# Patient Record
Sex: Male | Born: 1935 | Race: White | Hispanic: No | Marital: Married | Smoking: Former smoker
Health system: Southern US, Community
[De-identification: ages and names within clinical notes are randomized; demographics above are authoritative.]

## PROBLEM LIST (undated history)

## (undated) DIAGNOSIS — K219 Gastro-esophageal reflux disease without esophagitis: Secondary | ICD-10-CM

## (undated) DIAGNOSIS — N401 Enlarged prostate with lower urinary tract symptoms: Secondary | ICD-10-CM

## (undated) DIAGNOSIS — N182 Chronic kidney disease, stage 2 (mild): Secondary | ICD-10-CM

## (undated) DIAGNOSIS — I442 Atrioventricular block, complete: Secondary | ICD-10-CM

## (undated) DIAGNOSIS — F419 Anxiety disorder, unspecified: Secondary | ICD-10-CM

## (undated) DIAGNOSIS — F3289 Other specified depressive episodes: Secondary | ICD-10-CM

## (undated) DIAGNOSIS — L299 Pruritus, unspecified: Secondary | ICD-10-CM

## (undated) DIAGNOSIS — M75120 Complete rotator cuff tear or rupture of unspecified shoulder, not specified as traumatic: Secondary | ICD-10-CM

## (undated) DIAGNOSIS — L570 Actinic keratosis: Secondary | ICD-10-CM

## (undated) DIAGNOSIS — I739 Peripheral vascular disease, unspecified: Secondary | ICD-10-CM

## (undated) DIAGNOSIS — F329 Major depressive disorder, single episode, unspecified: Secondary | ICD-10-CM

## (undated) DIAGNOSIS — K5730 Diverticulosis of large intestine without perforation or abscess without bleeding: Secondary | ICD-10-CM

## (undated) DIAGNOSIS — N508 Other specified disorders of male genital organs: Secondary | ICD-10-CM

## (undated) DIAGNOSIS — M1990 Unspecified osteoarthritis, unspecified site: Secondary | ICD-10-CM

## (undated) DIAGNOSIS — K5790 Diverticulosis of intestine, part unspecified, without perforation or abscess without bleeding: Secondary | ICD-10-CM

## (undated) DIAGNOSIS — G4700 Insomnia, unspecified: Secondary | ICD-10-CM

## (undated) DIAGNOSIS — I10 Essential (primary) hypertension: Secondary | ICD-10-CM

## (undated) DIAGNOSIS — N400 Enlarged prostate without lower urinary tract symptoms: Secondary | ICD-10-CM

## (undated) DIAGNOSIS — K222 Esophageal obstruction: Secondary | ICD-10-CM

## (undated) DIAGNOSIS — I2510 Atherosclerotic heart disease of native coronary artery without angina pectoris: Secondary | ICD-10-CM

## (undated) DIAGNOSIS — H9190 Unspecified hearing loss, unspecified ear: Secondary | ICD-10-CM

## (undated) DIAGNOSIS — I82409 Acute embolism and thrombosis of unspecified deep veins of unspecified lower extremity: Secondary | ICD-10-CM

## (undated) DIAGNOSIS — M720 Palmar fascial fibromatosis [Dupuytren]: Secondary | ICD-10-CM

## (undated) DIAGNOSIS — D126 Benign neoplasm of colon, unspecified: Secondary | ICD-10-CM

## (undated) DIAGNOSIS — E119 Type 2 diabetes mellitus without complications: Secondary | ICD-10-CM

## (undated) DIAGNOSIS — R1310 Dysphagia, unspecified: Secondary | ICD-10-CM

## (undated) DIAGNOSIS — M545 Low back pain, unspecified: Secondary | ICD-10-CM

## (undated) DIAGNOSIS — N138 Other obstructive and reflux uropathy: Secondary | ICD-10-CM

## (undated) DIAGNOSIS — K449 Diaphragmatic hernia without obstruction or gangrene: Secondary | ICD-10-CM

## (undated) DIAGNOSIS — C439 Malignant melanoma of skin, unspecified: Secondary | ICD-10-CM

## (undated) DIAGNOSIS — E785 Hyperlipidemia, unspecified: Secondary | ICD-10-CM

## (undated) DIAGNOSIS — K5900 Constipation, unspecified: Secondary | ICD-10-CM

## (undated) HISTORY — PX: COLONOSCOPY: SHX174

## (undated) HISTORY — DX: Acute embolism and thrombosis of unspecified deep veins of unspecified lower extremity: I82.409

## (undated) HISTORY — DX: Low back pain, unspecified: M54.50

## (undated) HISTORY — DX: Benign prostatic hyperplasia with lower urinary tract symptoms: N13.8

## (undated) HISTORY — DX: Pruritus, unspecified: L29.9

## (undated) HISTORY — DX: Other specified disorders of male genital organs: N50.8

## (undated) HISTORY — DX: Peripheral vascular disease, unspecified: I73.9

## (undated) HISTORY — DX: Actinic keratosis: L57.0

## (undated) HISTORY — DX: Malignant melanoma of skin, unspecified: C43.9

## (undated) HISTORY — DX: Low back pain: M54.5

## (undated) HISTORY — DX: Dysphagia, unspecified: R13.10

## (undated) HISTORY — DX: Benign prostatic hyperplasia without lower urinary tract symptoms: N40.0

## (undated) HISTORY — DX: Major depressive disorder, single episode, unspecified: F32.9

## (undated) HISTORY — DX: Diverticulosis of intestine, part unspecified, without perforation or abscess without bleeding: K57.90

## (undated) HISTORY — DX: Constipation, unspecified: K59.00

## (undated) HISTORY — DX: Atherosclerotic heart disease of native coronary artery without angina pectoris: I25.10

## (undated) HISTORY — DX: Complete rotator cuff tear or rupture of unspecified shoulder, not specified as traumatic: M75.120

## (undated) HISTORY — DX: Benign prostatic hyperplasia with lower urinary tract symptoms: N40.1

## (undated) HISTORY — DX: Insomnia, unspecified: G47.00

## (undated) HISTORY — DX: Other specified depressive episodes: F32.89

## (undated) HISTORY — DX: Unspecified hearing loss, unspecified ear: H91.90

## (undated) HISTORY — PX: PACEMAKER INSERTION: SHX728

## (undated) HISTORY — DX: Esophageal obstruction: K22.2

## (undated) HISTORY — DX: Diverticulosis of large intestine without perforation or abscess without bleeding: K57.30

## (undated) HISTORY — DX: Essential (primary) hypertension: I10

## (undated) HISTORY — DX: Unspecified osteoarthritis, unspecified site: M19.90

## (undated) HISTORY — DX: Palmar fascial fibromatosis (dupuytren): M72.0

## (undated) HISTORY — DX: Benign neoplasm of colon, unspecified: D12.6

## (undated) HISTORY — DX: Type 2 diabetes mellitus without complications: E11.9

## (undated) HISTORY — PX: CATARACT EXTRACTION: SUR2

## (undated) HISTORY — DX: Anxiety disorder, unspecified: F41.9

## (undated) HISTORY — DX: Hyperlipidemia, unspecified: E78.5

## (undated) HISTORY — DX: Gastro-esophageal reflux disease without esophagitis: K21.9

## (undated) HISTORY — DX: Chronic kidney disease, stage 2 (mild): N18.2

## (undated) HISTORY — DX: Atrioventricular block, complete: I44.2

## (undated) HISTORY — DX: Diaphragmatic hernia without obstruction or gangrene: K44.9

---
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
## 1978-05-22 HISTORY — PX: OTHER SURGICAL HISTORY: SHX169

## 1985-05-22 HISTORY — PX: CAROTID ENDARTERECTOMY: SUR193

## 1991-05-23 HISTORY — PX: KNEE ARTHROSCOPY: SUR90

## 1997-11-16 ENCOUNTER — Ambulatory Visit (HOSPITAL_COMMUNITY): Admission: RE | Admit: 1997-11-16 | Discharge: 1997-11-16 | Payer: Self-pay | Admitting: Specialist

## 1997-12-28 ENCOUNTER — Other Ambulatory Visit: Admission: RE | Admit: 1997-12-28 | Discharge: 1997-12-28 | Payer: Self-pay | Admitting: Gastroenterology

## 2001-03-14 ENCOUNTER — Ambulatory Visit (HOSPITAL_COMMUNITY): Admission: RE | Admit: 2001-03-14 | Discharge: 2001-03-14 | Payer: Self-pay | Admitting: Cardiology

## 2002-10-23 ENCOUNTER — Emergency Department (HOSPITAL_COMMUNITY): Admission: EM | Admit: 2002-10-23 | Discharge: 2002-10-23 | Payer: Self-pay | Admitting: Emergency Medicine

## 2002-10-23 ENCOUNTER — Encounter: Payer: Self-pay | Admitting: Emergency Medicine

## 2002-11-19 ENCOUNTER — Encounter: Payer: Self-pay | Admitting: Internal Medicine

## 2002-11-19 ENCOUNTER — Ambulatory Visit (HOSPITAL_COMMUNITY): Admission: RE | Admit: 2002-11-19 | Discharge: 2002-11-19 | Payer: Self-pay | Admitting: Internal Medicine

## 2002-11-20 ENCOUNTER — Ambulatory Visit (HOSPITAL_COMMUNITY): Admission: RE | Admit: 2002-11-20 | Discharge: 2002-11-20 | Payer: Self-pay | Admitting: Internal Medicine

## 2002-11-20 ENCOUNTER — Encounter: Payer: Self-pay | Admitting: Internal Medicine

## 2003-04-29 ENCOUNTER — Inpatient Hospital Stay (HOSPITAL_COMMUNITY): Admission: EM | Admit: 2003-04-29 | Discharge: 2003-04-30 | Payer: Self-pay | Admitting: Emergency Medicine

## 2003-05-23 HISTORY — PX: COLONOSCOPY: SHX174

## 2003-11-08 ENCOUNTER — Ambulatory Visit (HOSPITAL_COMMUNITY): Admission: RE | Admit: 2003-11-08 | Discharge: 2003-11-08 | Payer: Self-pay | Admitting: Orthopaedic Surgery

## 2003-12-10 ENCOUNTER — Ambulatory Visit (HOSPITAL_COMMUNITY): Admission: RE | Admit: 2003-12-10 | Discharge: 2003-12-10 | Payer: Self-pay | Admitting: Neurology

## 2004-01-14 ENCOUNTER — Ambulatory Visit (HOSPITAL_COMMUNITY): Admission: RE | Admit: 2004-01-14 | Discharge: 2004-01-15 | Payer: Self-pay | Admitting: Internal Medicine

## 2004-01-21 ENCOUNTER — Ambulatory Visit (HOSPITAL_COMMUNITY): Admission: RE | Admit: 2004-01-21 | Discharge: 2004-01-21 | Payer: Self-pay | Admitting: Internal Medicine

## 2004-03-29 ENCOUNTER — Ambulatory Visit: Payer: Self-pay | Admitting: Cardiology

## 2004-04-27 ENCOUNTER — Ambulatory Visit: Payer: Self-pay | Admitting: Cardiology

## 2004-05-24 ENCOUNTER — Ambulatory Visit: Payer: Self-pay | Admitting: *Deleted

## 2004-06-09 ENCOUNTER — Ambulatory Visit: Payer: Self-pay | Admitting: Internal Medicine

## 2004-11-08 ENCOUNTER — Ambulatory Visit: Payer: Self-pay | Admitting: Cardiology

## 2005-06-19 ENCOUNTER — Ambulatory Visit: Payer: Self-pay | Admitting: Internal Medicine

## 2005-07-05 ENCOUNTER — Ambulatory Visit: Payer: Self-pay

## 2005-11-21 ENCOUNTER — Ambulatory Visit: Payer: Self-pay | Admitting: Cardiology

## 2005-11-23 ENCOUNTER — Ambulatory Visit: Payer: Self-pay | Admitting: Internal Medicine

## 2005-12-25 ENCOUNTER — Ambulatory Visit: Payer: Self-pay | Admitting: Internal Medicine

## 2006-02-01 ENCOUNTER — Ambulatory Visit: Payer: Self-pay | Admitting: *Deleted

## 2006-02-01 ENCOUNTER — Inpatient Hospital Stay (HOSPITAL_COMMUNITY): Admission: EM | Admit: 2006-02-01 | Discharge: 2006-02-04 | Payer: Self-pay | Admitting: Emergency Medicine

## 2006-02-06 ENCOUNTER — Encounter: Payer: Self-pay | Admitting: Cardiology

## 2006-02-06 ENCOUNTER — Ambulatory Visit: Payer: Self-pay

## 2006-02-15 ENCOUNTER — Ambulatory Visit: Payer: Self-pay | Admitting: Cardiology

## 2006-04-09 ENCOUNTER — Ambulatory Visit: Payer: Self-pay | Admitting: Cardiology

## 2006-05-28 ENCOUNTER — Ambulatory Visit (HOSPITAL_COMMUNITY): Admission: RE | Admit: 2006-05-28 | Discharge: 2006-05-28 | Payer: Self-pay | Admitting: Cardiology

## 2006-05-28 ENCOUNTER — Ambulatory Visit: Payer: Self-pay | Admitting: Cardiology

## 2006-05-29 ENCOUNTER — Ambulatory Visit: Payer: Self-pay | Admitting: Cardiology

## 2006-05-29 ENCOUNTER — Ambulatory Visit: Payer: Self-pay

## 2006-05-29 ENCOUNTER — Encounter: Payer: Self-pay | Admitting: Cardiology

## 2006-12-24 ENCOUNTER — Ambulatory Visit: Payer: Self-pay

## 2007-02-11 ENCOUNTER — Ambulatory Visit: Payer: Self-pay | Admitting: Internal Medicine

## 2007-02-25 ENCOUNTER — Encounter: Payer: Self-pay | Admitting: *Deleted

## 2007-02-25 DIAGNOSIS — I10 Essential (primary) hypertension: Secondary | ICD-10-CM

## 2007-02-25 DIAGNOSIS — I428 Other cardiomyopathies: Secondary | ICD-10-CM | POA: Insufficient documentation

## 2007-02-25 DIAGNOSIS — M75120 Complete rotator cuff tear or rupture of unspecified shoulder, not specified as traumatic: Secondary | ICD-10-CM

## 2007-02-25 DIAGNOSIS — Z9089 Acquired absence of other organs: Secondary | ICD-10-CM

## 2007-02-25 DIAGNOSIS — M1990 Unspecified osteoarthritis, unspecified site: Secondary | ICD-10-CM | POA: Insufficient documentation

## 2007-02-25 DIAGNOSIS — D126 Benign neoplasm of colon, unspecified: Secondary | ICD-10-CM | POA: Insufficient documentation

## 2007-02-25 DIAGNOSIS — Z87898 Personal history of other specified conditions: Secondary | ICD-10-CM | POA: Insufficient documentation

## 2007-02-25 DIAGNOSIS — Z8719 Personal history of other diseases of the digestive system: Secondary | ICD-10-CM

## 2007-02-25 DIAGNOSIS — Z9889 Other specified postprocedural states: Secondary | ICD-10-CM

## 2007-02-25 DIAGNOSIS — K219 Gastro-esophageal reflux disease without esophagitis: Secondary | ICD-10-CM | POA: Insufficient documentation

## 2007-02-25 DIAGNOSIS — F329 Major depressive disorder, single episode, unspecified: Secondary | ICD-10-CM

## 2007-02-25 DIAGNOSIS — I459 Conduction disorder, unspecified: Secondary | ICD-10-CM

## 2007-02-25 DIAGNOSIS — E119 Type 2 diabetes mellitus without complications: Secondary | ICD-10-CM

## 2007-02-25 DIAGNOSIS — I739 Peripheral vascular disease, unspecified: Secondary | ICD-10-CM

## 2007-02-25 DIAGNOSIS — I82409 Acute embolism and thrombosis of unspecified deep veins of unspecified lower extremity: Secondary | ICD-10-CM | POA: Insufficient documentation

## 2007-02-25 DIAGNOSIS — E785 Hyperlipidemia, unspecified: Secondary | ICD-10-CM | POA: Insufficient documentation

## 2007-02-25 DIAGNOSIS — K449 Diaphragmatic hernia without obstruction or gangrene: Secondary | ICD-10-CM | POA: Insufficient documentation

## 2007-03-12 ENCOUNTER — Ambulatory Visit: Payer: Self-pay | Admitting: Internal Medicine

## 2007-04-10 ENCOUNTER — Ambulatory Visit: Payer: Self-pay | Admitting: Internal Medicine

## 2007-05-07 ENCOUNTER — Ambulatory Visit: Payer: Self-pay | Admitting: Internal Medicine

## 2007-06-05 ENCOUNTER — Ambulatory Visit: Payer: Self-pay | Admitting: Internal Medicine

## 2007-09-04 ENCOUNTER — Ambulatory Visit: Payer: Self-pay | Admitting: Internal Medicine

## 2007-12-04 ENCOUNTER — Ambulatory Visit: Payer: Self-pay | Admitting: Internal Medicine

## 2007-12-23 ENCOUNTER — Ambulatory Visit: Payer: Self-pay | Admitting: Internal Medicine

## 2008-03-27 ENCOUNTER — Ambulatory Visit: Payer: Self-pay | Admitting: Internal Medicine

## 2008-06-19 ENCOUNTER — Encounter (INDEPENDENT_AMBULATORY_CARE_PROVIDER_SITE_OTHER): Payer: Self-pay | Admitting: *Deleted

## 2008-08-11 ENCOUNTER — Encounter: Payer: Self-pay | Admitting: Internal Medicine

## 2008-08-11 ENCOUNTER — Ambulatory Visit: Payer: Self-pay | Admitting: Internal Medicine

## 2008-08-26 ENCOUNTER — Ambulatory Visit: Payer: Self-pay | Admitting: Internal Medicine

## 2008-10-28 ENCOUNTER — Ambulatory Visit: Payer: Self-pay | Admitting: Internal Medicine

## 2009-01-05 ENCOUNTER — Encounter: Payer: Self-pay | Admitting: Internal Medicine

## 2009-01-15 ENCOUNTER — Encounter: Payer: Self-pay | Admitting: Internal Medicine

## 2009-01-20 DIAGNOSIS — C439 Malignant melanoma of skin, unspecified: Secondary | ICD-10-CM

## 2009-01-20 HISTORY — DX: Malignant melanoma of skin, unspecified: C43.9

## 2009-01-27 ENCOUNTER — Ambulatory Visit: Payer: Self-pay | Admitting: Internal Medicine

## 2009-02-26 HISTORY — PX: ENUCLEATION: SHX628

## 2009-04-28 ENCOUNTER — Ambulatory Visit: Payer: Self-pay | Admitting: Internal Medicine

## 2009-04-28 ENCOUNTER — Encounter: Payer: Self-pay | Admitting: Internal Medicine

## 2009-07-08 ENCOUNTER — Encounter: Admission: RE | Admit: 2009-07-08 | Discharge: 2009-07-08 | Payer: Self-pay | Admitting: Internal Medicine

## 2009-08-03 ENCOUNTER — Ambulatory Visit: Payer: Self-pay | Admitting: Internal Medicine

## 2009-10-05 ENCOUNTER — Ambulatory Visit: Payer: Self-pay | Admitting: Vascular Surgery

## 2009-11-02 ENCOUNTER — Ambulatory Visit: Payer: Self-pay | Admitting: Internal Medicine

## 2010-02-01 ENCOUNTER — Encounter: Payer: Self-pay | Admitting: Internal Medicine

## 2010-02-01 ENCOUNTER — Ambulatory Visit: Payer: Self-pay | Admitting: Internal Medicine

## 2010-03-18 ENCOUNTER — Encounter (INDEPENDENT_AMBULATORY_CARE_PROVIDER_SITE_OTHER): Payer: Self-pay | Admitting: *Deleted

## 2010-04-01 ENCOUNTER — Telehealth (INDEPENDENT_AMBULATORY_CARE_PROVIDER_SITE_OTHER): Payer: Self-pay | Admitting: *Deleted

## 2010-05-02 ENCOUNTER — Encounter (INDEPENDENT_AMBULATORY_CARE_PROVIDER_SITE_OTHER): Payer: Self-pay | Admitting: *Deleted

## 2010-05-03 ENCOUNTER — Ambulatory Visit: Payer: Self-pay | Admitting: Internal Medicine

## 2010-06-21 NOTE — Cardiovascular Report (Signed)
Summary: Office Visit   Office Visit   Imported By: Roderic Ovens 08/06/2009 16:21:03  _____________________________________________________________________  External Attachment:    Type:   Image     Comment:   External Document

## 2010-06-21 NOTE — Progress Notes (Signed)
Summary: Records Request   Faxed OV & EKG to Robin at [facility] ([phone]). Debby Freiberg  April 01, 2010 12:33 PM

## 2010-06-21 NOTE — Assessment & Plan Note (Signed)
Summary: pc2  Medications Added OMEPRAZOLE 40 MG CPDR (OMEPRAZOLE) Take one tablet by mouth once daily. SIMVASTATIN 40 MG TABS (SIMVASTATIN) Take one tablet by mouth daily at bedtime ASPIRIN 81 MG TBEC (ASPIRIN) Take one tablet by mouth daily SERTRALINE HCL 50 MG TABS (SERTRALINE HCL) Take one tablet by mouth once daily. VITAMIN D 1000 UNIT TABS (CHOLECALCIFEROL) Take one tablet by mouth once daily.      Allergies Added:   Visit Type:  Follow-up Primary Provider:  Ann Maki, MD   History of Present Illness: Eugene Long returns today for followup.  He is a very pleasant elderly male with symptomatic bradycardia and a pacemaker implanted back in 2005. The patient notes today that he has very minimal dyspnea with exertion. This is not particularly worse than it has been.  He admits to some sedentary lifestyle in the last several months secondary to the cold weather.  He returns today for followup.  He has had no palpitations. 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  Current Medications (verified): 1)  Glimepiride 2 Mg  Tabs (Glimepiride) .... Take One Half Tablet Daily 2)  Omeprazole 40 Mg Cpdr (Omeprazole) .... Take One Tablet By Mouth Once Daily. 3)  Finasteride 5 Mg  Tabs (Finasteride) .... Take One Tablet Once Daily 4)  Amlodipine Besylate 2.5 Mg  Tabs (Amlodipine Besylate) .... Take One Tablet Once Daily 5)  Metoprolol Tartrate 25 Mg  Tabs (Metoprolol Tartrate) .... Take One Tablet Twice Daily 6)  Stool Softener .... Take One Daily 7)  Lisinopril 10 Mg  Tabs (Lisinopril) .... Take One Tablet Once Daily 8)  Simvastatin 40 Mg Tabs (Simvastatin) .... Take One Tablet By Mouth Daily At Bedtime 9)  Aspirin 81 Mg Tbec (Aspirin) .... Take One Tablet By Mouth Daily 10)  Claritin 10 Mg  Tabs (Loratadine) .... Take One Tablet Once Daily 11)  Alprazolam 0.25 Mg  Tabs (Alprazolam) .... Take One Tablet Once Daily 12)  Nitroquick 0.4 Mg  Subl  (Nitroglycerin) .... Take One Tablet Sl As Needed For Chest Pain 13)  Sertraline Hcl 50 Mg Tabs (Sertraline Hcl) .... Take One Tablet By Mouth Once Daily. 14)  Vitamin D 1000 Unit Tabs (Cholecalciferol) .... Take One Tablet By Mouth Once Daily.  Allergies (verified): 1)  ! Codeine 2)  ! 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) DYSLIPIDEMIA (ICD-272.4) DIABETES MELLITUS, TYPE II (ICD-250.00) BENIGN PROSTATIC HYPERTROPHY, HX OF (ICD-V13.8) DIVERTICULOSIS, COLON, HX OF (ICD-V12.79) PACEMAKER, PERMANENT (ICD-V45.01) HYPERTENSION (ICD-401.9) ARTHROSCOPY, KNEE, HX OF (ICD-V45.89) APPENDECTOMY, HX OF (ICD-V45.79) CAROTID ENDARTERECTOMY, RIGHT, HX OF (ICD-V15.1)   HYPERTENSION (ICD-401.9)  Past Surgical History: Last updated: 02/25/2007 PACEMAKER, PERMANENT (ICD-V45.01) ARTHROSCOPY, KNEE, HX OF (ICD-V45.89) APPENDECTOMY, HX OF (ICD-V45.79) CAROTID ENDARTERECTOMY, RIGHT, HX OF (ICD-V15.1)    Review of Systems  The patient denies chest pain, syncope, dyspnea on exertion, and peripheral edema.    Vital Signs:  Patient profile:   77 year old male Height:      68 inches Weight:      150 pounds Pulse rate:   60 / minute BP sitting:   144 / 60  (left arm)  Vitals Entered By: Laurance Flatten CMA (August 03, 2009 9:39 AM)  Physical Exam  General:  Elderly, well developed, well nourished, in no acute distress.  HEENT: normal except for left eye prosthesis. Neck: supple. No JVD. Carotids 2+ bilaterally no bruits Cor: RRR no rubs, gallops or murmur Lungs: CTA. Well healed PPM incision. Ab: soft, nontender. nondistended. No HSM. Good bowel sounds Ext:  warm. no cyanosis, clubbing or edema Neuro: alert and oriented. Grossly nonfocal. affect pleasant    PPM Specifications Following MD:  Lewayne Bunting, MD     PPM Vendor:  Medtronic     PPM Model Number:  [id number] [id number]     PPM Serial Number:  [id number] PPM DOI:  01/14/2004      Lead 1    Location: RA     DOI: 01/14/2004     Model #: [id number]     Serial #: [id number]     Status: active Lead 2    Location: RV     DOI: 01/14/2004     Model #: [id number]     Serial #: [id number]     Status: active  Magnet Response Rate:  BOL 100 ERI 86  Indications:  Syncope   PPM Follow Up Remote Check?  No Battery Voltage:  2.75 V     Battery Est. Longevity:  4 YEARS     Pacer Dependent:  Yes       PPM Device Measurements Atrium  Amplitude: 2.5 mV, Impedance: 500 ohms, Threshold: 0.75 V at 0.4 msec Right Ventricle  Amplitude: PACED AT 30 mV, Impedance: 500 ohms, Threshold: 1.25 V at 0.4 msec  Episodes MS Episodes:  0     Ventricular High Rate:  0     Atrial Pacing:  63%     Ventricular Pacing:  100%  Parameters Mode:  DDD     Lower Rate Limit:  60     Upper Rate Limit:  140 Paced AV Delay:  275     Next Cardiology Appt Due:  01/20/2010 Tech Comments:  Normal device function.  Histagrams flat, but patient did not tolerate rate response being on in the past.  No changes made today.  Pt does TTM's with [facility].  ROV 6 months clinic. Gypsy Balsam RN BSN  August 03, 2009 9:42 AM  MD Comments:  Agree with above.  Impression & Recommendations:  Problem # 1:  PACEMAKER (ICD-V45.Marland Kitchen01) His device is working normally with about 3 yrs of longevity.  Problem # 2:  DYSLIPIDEMIA (ICD-272.4) He will continue his daily walking an maintain a low sodium diet. His updated medication list for this problem includes:    Simvastatin 40 Mg Tabs (Simvastatin) .Marland Kitchen... Take one tablet by mouth daily at bedtime

## 2010-06-21 NOTE — Cardiovascular Report (Signed)
Summary: TTM   TTM   Imported By: Roderic Ovens 02/16/2010 11:28:35  _____________________________________________________________________  External Attachment:    Type:   Image     Comment:   External Document

## 2010-06-21 NOTE — Miscellaneous (Signed)
Summary: dx code correction   Clinical Lists Changes  Problems: Changed problem from PACEMAKER (ICD-V45..01) to PACEMAKER, PERMANENT (ICD-V45.01) changed the incorrect dx code to correct dx code Donna Keene  March 18, 2010 10:31 AM 

## 2010-06-23 NOTE — Letter (Signed)
Summary: Appointment - Reschedule  [facility], Main Office  [address]   [place], [place] [address]   Phone: [phone]  Fax: [phone]     May 02, 2010 MRN: [id number]   [facility] [address] [place], [place]  [address]   Dear Mr. Deike,   Due to a change in our office schedule, your appointment on 08-02-10  at  12:20 must be changed.  It is very important that we reach you to reschedule this appointment. We look forward to participating in your health care needs. Please contact us at the number listed above at your earliest convenience to reschedule this appointment.     Sincerely,  Glass blower/designer

## 2010-06-23 NOTE — Cardiovascular Report (Signed)
Summary: TTM   TTM   Imported By: Roderic Ovens 05/20/2010 11:42:26  _____________________________________________________________________  External Attachment:    Type:   Image     Comment:   External Document

## 2010-06-23 NOTE — Letter (Signed)
Summary: Appointment - Reschedule  [facility], Main Office  [address]   [place], [place] [address]   Phone: [phone]  Fax: [phone]     May 02, 2010 MRN: [id number]   [facility] [address] [place], [place]  [address]   Dear Mr. Fraleigh,   Due to a change in our office schedule, your appointment on  08-02-10  at 12:10pm           must be changed.  It is very important that we reach you to reschedule this appointment. We look forward to participating in your health care needs. Please contact us at the number listed above at your earliest convenience to reschedule this appointment.     Sincerely,  Glass blower/designer

## 2010-06-24 NOTE — Cardiovascular Report (Signed)
Summary: TTM   TTM   Imported By: Roderic Ovens 11/29/2009 14:54:22  _____________________________________________________________________  External Attachment:    Type:   Image     Comment:   External Document

## 2010-08-02 ENCOUNTER — Encounter: Payer: Self-pay | Admitting: Internal Medicine

## 2010-08-02 ENCOUNTER — Encounter (INDEPENDENT_AMBULATORY_CARE_PROVIDER_SITE_OTHER): Payer: Medicare Other | Admitting: Internal Medicine

## 2010-08-02 DIAGNOSIS — I10 Essential (primary) hypertension: Secondary | ICD-10-CM

## 2010-08-02 DIAGNOSIS — I442 Atrioventricular block, complete: Secondary | ICD-10-CM

## 2010-08-09 NOTE — Cardiovascular Report (Signed)
Summary: Office Visit   Office Visit   Imported By: Roderic Ovens 08/05/2010 15:01:55  _____________________________________________________________________  External Attachment:    Type:   Image     Comment:   External Document

## 2010-08-09 NOTE — Assessment & Plan Note (Signed)
Summary: device/saf Eartha Inch pt call-mj rs pt from [facility]plist/mt/kl  Medications Added PRAVASTATIN SODIUM 40 MG TABS (PRAVASTATIN SODIUM) Take one tablet by mouth daily at bedtime        Visit Type:  Follow-up Primary Provider:  Ann Maki, MD   History of Present Illness: Eugene Long returns today for followup.  He is a very pleasant elderly male with symptomatic bradycardia and a pacemaker implanted back in 2005. The patient notes today that he has very minimal dyspnea with exertion. This is not particularly worse than it has been.  He admits to some sedentary lifestyle in the last several months secondary to the cold weather.  He returns today for followup.  He has had no palpitations. He has been without recurrence of his lymphoma.  Current Medications (verified): 1)  Glimepiride 2 Mg  Tabs (Glimepiride) .... Take One Half Tablet Daily 2)  Omeprazole 40 Mg Cpdr (Omeprazole) .... Take One Tablet By Mouth Once Daily. 3)  Finasteride 5 Mg  Tabs (Finasteride) .... Take One Tablet Once Daily 4)  Amlodipine Besylate 2.5 Mg  Tabs (Amlodipine Besylate) .... Take One Tablet Once Daily 5)  Metoprolol Tartrate 25 Mg  Tabs (Metoprolol Tartrate) .... Take One Tablet Twice Daily 6)  Stool Softener .... Take One Daily 7)  Pravastatin Sodium 40 Mg Tabs (Pravastatin Sodium) .... Take One Tablet By Mouth Daily At Bedtime 8)  Aspirin 81 Mg Tbec (Aspirin) .... Take One Tablet By Mouth Daily 9)  Claritin 10 Mg  Tabs (Loratadine) .... Take One Tablet Once Daily 10)  Alprazolam 0.25 Mg  Tabs (Alprazolam) .... Take One Tablet Once Daily 11)  Nitroquick 0.4 Mg  Subl (Nitroglycerin) .... Take One Tablet Sl As Needed For Chest Pain 12)  Sertraline Hcl 50 Mg Tabs (Sertraline Hcl) .... Take One Tablet By Mouth Once Daily. 13)  Vitamin D 1000 Unit Tabs (Cholecalciferol) .... Take One Tablet By Mouth Once Daily.  Allergies: 1)  ! Codeine 2)  ! 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) DYSLIPIDEMIA (ICD-272.4) DIABETES MELLITUS, TYPE II (ICD-250.00) BENIGN PROSTATIC HYPERTROPHY, HX OF (ICD-V13.8) DIVERTICULOSIS, COLON, HX OF (ICD-V12.79) PACEMAKER, PERMANENT (ICD-V45.01) HYPERTENSION (ICD-401.9) ARTHROSCOPY, KNEE, HX OF (ICD-V45.89) APPENDECTOMY, HX OF (ICD-V45.79) CAROTID ENDARTERECTOMY, RIGHT, HX OF (ICD-V15.1)   HYPERTENSION (ICD-401.9)  Past Surgical History: Last updated: 02/25/2007 PACEMAKER, PERMANENT (ICD-V45.01) ARTHROSCOPY, KNEE, HX OF (ICD-V45.89) APPENDECTOMY, HX OF (ICD-V45.79) CAROTID ENDARTERECTOMY, RIGHT, HX OF (ICD-V15.1)    Review of Systems  The patient denies chest pain, syncope, dyspnea on exertion, and peripheral edema.    Vital Signs:  Patient profile:   77 year old male Height:      68 inches Weight:      149 pounds BMI:     22.74 Pulse rate:   64 / minute BP sitting:   140 / 70  (left arm)  Vitals Entered By: Eugene Long CMA (August 02, 2010 3:56 PM)  Physical Exam  General:  Elderly, well developed, well nourished, in no acute distress.  HEENT: normal except for left eye prosthesis. Neck: supple. No JVD. Carotids 2+ bilaterally no bruits Cor: RRR no rubs, gallops or murmur Lungs: CTA. Well healed PPM incision. Ab: soft, nontender. nondistended. No HSM. Good bowel sounds Ext: warm. no cyanosis,  clubbing or edema Neuro: alert and oriented. Grossly nonfocal. affect pleasant    PPM Specifications Following MD:  Eugene Bunting, MD     PPM Vendor:  Medtronic     PPM Model Number:  [id number]     PPM Serial Number:  [id number] PPM DOI:  01/14/2004      Lead 1    Location: RA     DOI: 01/14/2004     Model #: 9147      Serial #: [id number]     Status: active Lead 2    Location: RV     DOI: 01/14/2004     Model #: [id number]     Serial #: [id number]     Status: active  Magnet Response Rate:  BOL 100 ERI 86  Indications:  Syncope   PPM Follow Up Pacer Dependent:  Yes      Parameters Mode:  DDD     Lower Rate Limit:  60     Upper Rate Limit:  140 Paced AV Delay:  275     Impression & Recommendations:  Problem # 1:  PACEMAKER, PERMANENT (ICD-V45.01) His device is working normally. Will recheck in several months.  Problem # 2:  HYPERTENSION (ICD-401.9) His blood pressure is moderately well controlled. He will continue her current meds and maintain a low sodium diet. The following medications were removed from the medication list:    Lisinopril 10 Mg Tabs (Lisinopril) .Marland Kitchen... Take one tablet once daily His updated medication list for this problem includes:    Amlodipine Besylate 2.5 Mg Tabs (Amlodipine besylate) .Marland Kitchen... Take one tablet once daily    Metoprolol Tartrate 25 Mg Tabs (Metoprolol tartrate) .Marland Kitchen... Take one tablet twice daily    Aspirin 81 Mg Tbec (Aspirin) .Marland Kitchen... Take one tablet by mouth daily

## 2010-10-04 NOTE — Assessment & Plan Note (Signed)
[facility]                         ELECTROPHYSIOLOGY OFFICE NOTE   [name], [name]                       MRN:          [id number]  DATE:08/11/2008                            DOB:          [birth year]    Eugene Long returns today for followup.  He is a very pleasant elderly male  with symptomatic bradycardia and a pacemaker implanted back in 2005.  The patient notes today that he has very minimal dyspnea with exertion.  This is not particularly worse than it has been.  He admits to some  sedentary lifestyle in the last several months secondary to the cold  weather.  He returns today for followup.  He has had no palpitations.   MEDICATIONS:  His medicines include glyburide 2 mg half tablet daily,  pantoprazole 40 a day, finasteride 5 a day, amlodipine 2.5 a day,  metoprolol 25 twice a day, lisinopril 40 mg 0.25 tablet daily,  simvastatin 40 mg daily, aspirin 325 a day.   PHYSICAL EXAMINATION:  GENERAL:  He is a pleasant elderly-appearing man  in no acute distress.  VITAL SIGNS:  Blood pressure was 130/53, the pulse 60 and regular, the  respirations were 18, his weight was 158 pounds.  NECK:  No jugular venous distention.  LUNGS:  Clear bilaterally to auscultation.  No wheezes, rales, or  rhonchi are present.  No increased work of breathing.  CARDIOVASCULAR:  Regular rate and rhythm.  Normal S1 and S2.  ABDOMEN:  Soft, nontender.  There is no organomegaly.  EXTREMITIES:  Demonstrated no cyanosis, clubbing, or edema.  The pulses  were 2+ and symmetric.   Interrogation of his pacemaker demonstrates a Medtronic Vitatron dual-  chamber device.  The P-waves are greater than 2.  There are no R-waves  secondary to complete heart block.  The pacing impedance was 550 in the  atrium and 500 in the ventricle.  Threshold 0.625 at 0.4 in the atrium  and 1 at 0.4 in the right ventricle.  Battery voltage was 2.75 volts.  Estimated longevity was 5 years.   IMPRESSION:  1. Symptomatic bradycardia.  2. Complete heart block causing symptomatic bradycardia.  3. Status post pacemaker insertion.   DISCUSSION:  Overall, Eugene Long is stable.  His pacemaker was working  normally.  He has had no recurrent syncope with his pacemaker placed.  I  will plan to see the patient back for pacemaker followup in 1 year.  I  have encouraged him to increase his activity.    Doylene Canning. Ladona Ridgel, MD  Electronically Signed   GWT/MedQ  DD: 08/11/2008  DT: 08/12/2008  Job #: [id number]

## 2010-10-04 NOTE — Procedures (Signed)
CAROTID DUPLEX EXAM   INDICATION:  Left subclavian bruit.   HISTORY:  Diabetes:  Yes.  Cardiac:  Pacemaker, MI.  Hypertension:  Yes.  Smoking:  No.  Previous Surgery:  Right CEA, September 1987.  CV History:  No.  Amaurosis Fugax No, Paresthesias No, Hemiparesis No.                                       RIGHT             LEFT  Brachial systolic pressure:         148               148  Brachial Doppler waveforms:         Triphasic         Triphasic  Vertebral direction of flow:        Antegrade         Antegrade  DUPLEX VELOCITIES (cm/sec)  CCA peak systolic                   105               103  ECA peak systolic                   108               104  ICA peak systolic                   119               173  ICA end diastolic                   27                39  PLAQUE MORPHOLOGY:                  Calcific          Calcific  PLAQUE AMOUNT:                      Mild              Mild  PLAQUE LOCATION:                    ECA, ICA, CCA     ECA, ICA, CCA   IMPRESSION:  1. Velocities suggest 40% to 59% stenosis in bilateral internal      carotid arteries.  2. Antegrade flow in bilateral vertebral arteries.  3. Bilateral subclavian arteries are triphasic.   ___________________________________________  Quita Skye. Hart Rochester, M.D.   NT/MEDQ  D:  10/05/2009  T:  10/05/2009  Job:  [id number]

## 2010-10-04 NOTE — Consult Note (Signed)
NEW PATIENT CONSULTATION   Eugene Long, Eugene Long  DOB:  11-Jun-1917                                       10/05/2009  EAVWU#:[id number]   The patient is a healthy 77 year old male referred for possible left  subclavian occlusive disease because of a left subclavian bruit.  The  patient denies any ischemic symptoms such as arm claudication, rest  pain, numbness, tingling or chronic swelling in the left arm.  He does  have a history of some temporary swelling in the left arm after a  pacemaker was inserted in the left subclavian vein in 2005 and after a  few treatments with heparin that resolved and has not recurred.  Occasionally he does notice some tightness in his left fingers and left  hand but no swelling above the mid forearm.  He denies any hemispheric  TIAs, aphasia, amaurosis fugax, diplopia, blurred vision or syncope.   CHRONIC MEDICAL PROBLEMS:  1. Coronary artery disease, previous MI in 1999.  2. Hypertension.  3. Type 2 diabetes mellitus.  4. Hyperlipidemia.  5. 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   FAMILY HISTORY:  Positive for coronary artery disease in a brother,  diabetes in an uncle, negative for stroke.   SOCIAL HISTORY:  The patient is married, has three children and is  retired.  Has not smoked since 1961.  Does not use alcohol.   REVIEW OF SYSTEMS:  Does have a heart murmur, old history of reflux  esophagitis which is under control, chronic constipation, arthritis, the  one history of a blood clot in the left subclavian vein which resolved.  All other systems are negative in review of systems.   PHYSICAL EXAMINATION:  Vital signs:  Blood pressure 157/62 in the right  arm, 150/68 in the left arm, heart rate 60, respirations 16.  General:  He is a well-developed, well-nourished elderly male in no apparent  distress, alert and oriented x3.  HEENT:  Exam reveals left eye has been  previously removed.  EOMs  intact on the right.  Everything else is  normal.  Chest:  Clear to auscultation.  No wheezing.  Cardiovascular:  Regular rhythm.  Carotid pulses are 3+.  There is a soft bruit on the  left side.  Abdomen:  Soft, nontender with no masses.  Neurologic:  Normal.  Musculoskeletal:  Free of major deformities.  Skin:  Free of  rashes.  Upper extremity exam:  Reveals 3+ brachial and radial pulses  bilaterally with well-perfused hands and no evidence of any swelling.   Today I ordered a carotid duplex exam which I have reviewed and  interpreted.  This reveals some mild to moderate left internal carotid  flow reduction approximating 50%-60% with the right internal carotid  being widely patent.  There is no evidence of any subclavian occlusive  disease and both vertebral arteries have antegrade flow.   This patient has previously undergone right carotid endarterectomy in  '87 back in [place] and that side is widely patent.  The left side has  some mild to moderate disease which is asymptomatic.  There is no  evidence of subclavian occlusive disease.  He needs no treatment but we  will see him back in two years for a followup carotid duplex exam unless  he develops  any symptoms in the interim.     Quita Skye Hart Rochester, M.D.  Electronically Signed   JDL/MEDQ  D:  10/05/2009  T:  10/06/2009  Job:  [id number]   cc:   Lenon Curt. Chilton Si, M.D.

## 2010-10-04 NOTE — Assessment & Plan Note (Signed)
[facility]                         ELECTROPHYSIOLOGY OFFICE NOTE   Long, Eugene Long                       MRN:          [id number]  DATE:12/24/2006                            DOB:          March 23, 1918    HISTORY OF PRESENT ILLNESS:  Eugene Long was seen today in the clinic on  December 24, 2006, for follow up of his Butatran model #[id number].  Date of  implant was January 14, 2004, for syncope.  On interrogation of his  device today, his battery voltage is 2.75 with an estimated longevity of  6.5 years.  P waves measured greater than 2.5 millivolts with an atrial  capture threshold of 0.625 volts at 0.4 milliseconds and an atrial lead  impendence of 550 ohms.  R waves measured greater than 8 millivolts with  a ventricular capture threshold of 1.25 volts at 0.4 milliseconds and a  ventricular lead impendence of 450 ohms.  There were no episodes  recorded.  He is 80% V pacing.  No changes were made in his parameters.  He will do his telephone checks with [facility] with return office visit in  one year.      Altha Harm, LPN  Electronically Signed      Doylene Canning. Ladona Ridgel, MD  Electronically Signed   PO/MedQ  DD: 12/24/2006  DT: 12/24/2006  Job #: [id number]

## 2010-10-07 NOTE — Discharge Summary (Signed)
NAME:  Eugene Long, Eugene Long NO.:  [id number]   MEDICAL RECORD NO.:  [id number]                   PATIENT TYPE:  OIB   LOCATION:  3733                                 FACILITY:  [facility]   PHYSICIAN:  Doylene Canning. Ladona Ridgel, M.D.               DATE OF BIRTH:  20-Jan-1918   DATE OF ADMISSION:  01/14/2004  DATE OF DISCHARGE:  01/15/2004                                 DISCHARGE SUMMARY   DISCHARGE DIAGNOSES:  1. Admit for elective implantation of DDD pacer.  2. History of Stokes-Adams syncope.  3. Intermittent complete heart block.  4. Left bundle branch block.   SECONDARY DIAGNOSES:  1. Diverticulitis.  2. Hypertension.  3. Status post arthroscopic surgery to both knees.  4. Extracranial cerebrovascular occlusive disease, status post right carotid     endarterectomy.  5. Type 2 diabetes.  6. Dyslipidemia.   CONDITION ON DISCHARGE:  The patient has done well after placement of  Medtronic DDD pacer.   DISCHARGE MEDICATIONS:  He goes home with the following medications:  1. Zocor 20 mg at bedtime.  2. Lisinopril 20 mg daily.  3. Proscar 5 mg daily.  4. Diclofenac 75 mg daily.  5. Enteric-coated aspirin 325 mg daily.  6. Protonix 40 mg daily.  7. Zelnorm 6 mg daily.  8. Amaryl 1 mg daily.   FOLLOWUP:  He follows up with Dr. Doylene Canning. Ladona Ridgel at the pacer clinic,  February 01, 2004 at 9:30, and he sees Dr. Ladona Ridgel, May 21, 2004 at  11:20 in the morning.   CONDITION ON DISCHARGE:  The patient has done well.      Maple Mirza, P.A.                    Doylene Canning. Ladona Ridgel, M.D.    GM/MEDQ  D:  01/15/2004  T:  01/17/2004  Job:  [id number]   cc:   Doylene Canning. Ladona Ridgel, M.D.

## 2010-10-07 NOTE — Discharge Summary (Signed)
NAME:  Eugene Long, RADEN NO.:  [id number]   MEDICAL RECORD NO.:  [id number]                   PATIENT TYPE:  OIB   LOCATION:  3733                                 FACILITY:  [facility]   PHYSICIAN:  Doylene Canning. Ladona Ridgel, M.D.               DATE OF BIRTH:  26-Dec-1917   DATE OF ADMISSION:  01/14/2004  DATE OF DISCHARGE:  01/15/2004                                 DISCHARGE SUMMARY   DISCHARGE DIAGNOSES:  1. Admitted for elective implantation of Medtronic DDD percutaneous     transvenous demand pacer.  2. History of Stokes-Adams syncope.  3. Intermittent complete heart block.  4. Left bundle branch block.   SECONDARY DIAGNOSES:  1. History of extracranial cerebrovascular occlusive disease, status post     right carotid endarterectomy.  2. Diverticulitis.  3. Hypertension.  4. Type 2 diabetes.  5. Dyslipidemia.  6. Status post arthroscopic surgery to both knees.   PROCEDURE:  January 14, 2004, implantation of Medtronic Vitatron T60 DR,  [place] [id number], serial #[id number] by Dr. Doylene Canning. Ladona Ridgel.  The patient  tolerated the procedure well and has had no post-procedure sequelae.   DISCHARGE DISPOSITION:  [name] is ready for discharge post procedure day  #1.  He is achieving 98% oxygen saturation on room air.  He is in a sinus  rhythm with a grade 2/6 holosystolic murmur.  He has had no pacing  indication on the telemetry overnight.  He had pacemaker evaluation on the  morning of August 26.  He was 100% sensed, no changes were made by the  pacing technologist.  His pacer incision site is healing nicely without  evidence of erythema or drainage.   DISCHARGE MEDICATIONS:  1. Zocor 20 mg daily at bedtime.  2. Lisinopril 20 mg daily.  3. Proscar 5 mg daily.  4. Diclofenac 75 mg daily.  5. Enteric-coated aspirin 325 mg daily.  6. Protonix 40 mg daily.  7. Zelnorm 6 mg daily.  8. Amaryl 1 mg daily.  9. For pain management, Tylenol 325 mg, one to two tablets every  4-6 hours     as needed.   ACTIVITY:  Activity sheet has been discussed with the patient.   DISCHARGE INSTRUCTIONS:  1. Diet:  A low-sodium, low-cholesterol diet.  2. The patient may sponge bathe until Friday, September 2, and keep his     incisions clean and dry until then.   FOLLOWUP:  1. He has an office visit with [facility] on February 01, 2004, at 9:30 in the morning.  2. He will see Dr. Ladona Ridgel at [facility] December 21 at 11:20 in the     morning.   BRIEF HISTORY:  The patient is an 77 year old male who has a long history of  left bundle branch block.  He had dizzy spells and presyncope about one year  ago.  He wore a cardiac monitor which showed sinus rhythm but no syncope and  no significant bradycardia.  The patient in the last year describes two  spells of frank syncope.  The first was back in May.  The second was in  July.  They were characterized by sudden onset of loss of consciousness.  For a second or two prior to that, he felt something funny, and then blacked  out.  Loss of consciousness ranged anywhere from 15-20 seconds.  He awoke  somewhat confused, but otherwise was fine.  He has undergone extensive  neurologic evaluation which was unremarkable.  MRI demonstrated mild  atherosclerosis but nothing critically narrowed in the internal carotid  artery or carotid siphons.  He returns for followup.   PLAN:  The plan is for a permanent pacemaker insertion secondary to Stokes-  Adams type syncope, secondary to intermittent complete heart block.  The  risks and benefits were explained to the patient.   HOSPITAL COURSE:  The patient presented on August 25, for elective  implantation of Medtronic DDD pacer.  The patient has had no problems post  procedure, and he goes home August 26, with the medications and follow up as  dictated.      Maple Mirza, P.A.                    Doylene Canning. Ladona Ridgel, M.D.    GM/MEDQ  D:  01/15/2004   T:  01/16/2004  Job:  [id number]   cc:   Doylene Canning. Ladona Ridgel, M.D.   Corwin Levins, M.D. [facility]   Mark C. Ophelia Charter, M.D.  [address] [place], [place] [address]  Fax: [phone]   Genene Churn. Love, M.D.  [address]  [address]  [place]  [place] [address]  Fax: [phone]

## 2010-10-07 NOTE — Cardiovascular Report (Signed)
NAME:  Eugene Long, Eugene Long NO.:  [id number]   MEDICAL RECORD NO.:  [id number]          PATIENT TYPE:  INP   LOCATION:  1825                         FACILITY:  [facility]   PHYSICIAN:  Everardo Beals. Juanda Chance, MD, FACCDATE OF BIRTH:  04-Apr-1918   DATE OF PROCEDURE:  02/01/2006  DATE OF DISCHARGE:                              CARDIAC CATHETERIZATION   HISTORY:  [name] is 77 years old, has a history of nonobstructive disease  at cardiac catheterization several years ago.  He also has a DDD pacemaker  in place.  He came the emergency room with chest pain last night and  positive troponins.  He had recurrent chest pain this morning, was seen by  Dr. Riley Kill, and started on Integrin, in addition to heparin and aspirin,  and arrangements were made to take him emergently to the catheterization  laboratory.   PROCEDURES:  The patient was still having chest pain on the cath table on  arrival.  Procedure was performed by the right femoral artery arterial  sheath and 5-French preformed coronary catheters.  The patient was 72, and  his creatinine was 1.6, and his creatinine clearance was less than 30 cc.  We made every attempt to minimize contrast.  After completion of diagnostic  coronary angiography, we decided to of the left ventricular to define the  problem.  This was done with 18 cc of contrast.  The right femoral artery  was closed Angio-Seal at the end of the procedure.  The patient tolerated  the procedure well and left the lab in satisfactory condition.   RESULTS:  The aortic pressure is 117/57 mean 83.  Left ventricular pressure  was 117/6.   Left main coronary is free of significant disease.   Left anterior descending artery gave rise to two diagonal branches and two  septal perforators.  There was 30% narrowing of the proximal LAD after the  first diagonal branch.  There was 40% systolic compression in the mid LAD.   The circumflex is a large vessel that gives rise to a  marginal branch,  second marginal branch and two posterolateral branches.  These vessels were  free of significant disease.   The right carotid artery is a moderate-size vessel gave rise to two right  ventricle branches, a posterior descending branch and two posterolateral  branches.  There is 40% proximal right coronary.   LEFT VENTRICULOGRAM:  The left ventriculogram with RAO projection, showed  akinesis of the apex and inferoapical and anteroapical segments.  The  anterior wall and the mid portion near the base moved well, and inferior  wall in the mid portion near the base moved well.  Estimated fraction was  40%.   CONCLUSION:  1. Nonobstructive coronary artery disease with 30% narrowing in the      proximal LAD, 40% systolic compression in the mid LAD, no significant      structural circumflex artery, and 40% narrowing in the proximal right      coronary.  2. Apical wall akinesis with an estimated ejection fraction of 40%.   RECOMMENDATIONS:  The patient has  had a non-ST-elevation myocardial  infarction and has a nonobstructive coronary disease and apical akinesis.  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  The situation  is a little unusual in that the patient is male, and I could not get any  history of any recent stress.  Will plan to treat him with beta-blockers,  ACE inhibitors and time.  His outlook should be good.           ______________________________  Everardo Beals Juanda Chance, MD, [facility]     BRB/MEDQ  D:  02/01/2006  T:  02/02/2006  Job:  [id number]   cc:   Arturo Morton. Riley Kill, MD, [facility]  Cardiopulmonary

## 2010-10-07 NOTE — Discharge Summary (Signed)
NAME:  Eugene, Long NO.:  [id number]   MEDICAL RECORD NO.:  [id number]                   PATIENT TYPE:  INP   LOCATION:  0449                                 FACILITY:  [facility]   PHYSICIAN:  Lina Sar, M.D. [facility]               DATE OF BIRTH:  Sep 30, 1917   DATE OF ADMISSION:  04/29/2003  DATE OF DISCHARGE:  04/30/2003                                 DISCHARGE SUMMARY   ADMISSION DIAGNOSES:  1. An 77 year old white male with abdominal distention and pain post     colonoscopy with x-ray findings consistent with colonic ileus.  2. Colonic diverticulosis.  3. Status post right carotid endarterectomy.  4. Hypertension.  5. Benign prostatic hypertrophy.  6. Status post appendectomy.   DISCHARGE DIAGNOSES:  1. Resolved colonic ileus.  2. Chronic constipation.   HISTORY:  [name] is a very nice 77 year old white male who is a primary  patient of Dr. Oliver Long referred to Dr. Sheryn Long for routine  colonoscopy for followup of adenomatous colon polyps.  He underwent  colonoscopy on the morning of admission at the [facility].  This was an  unremarkable exam with the exception of sigmoid diverticulosis.  The patient  did develop some gaseous discomfort at the time of discharge, but was felt  to be a little bit better post Levsin.  This afternoon, he has continued to  have somewhat progressive abdominal pain, distention, nausea, and inability  to pass gas despite several Levsin's.  He was advised to come to the  emergency room for abdominal films.  He is seen now and evaluated having  continuous pain and has abdominal distention and tympany on exam.  KUB shows  no free air.  He does have gaseous distention of the colon consistent with  an ileus.  He is admitted for overnight observation for supportive  management.   LABORATORY DATA:  On admission, WBC of 9.5, hemoglobin of 13.4, hematocrit  of 38, MCV of 88, platelets 268.  Electrolytes within normal  limits.  BUN  17, creatinine 1.3, albumin 3.9.  All liver function studies normal.  Magnesium 2.1.   HOSPITAL COURSE:  The patient was admitted to the service of Dr. Lina Sar  who was covering the hospital.  He was placed on a clear liquid diet,  started on IV fluids, given IV Reglan and Phenergan as needed for nausea.  He had Fleets enema and was to have a rectal tube placed for decompression;  however, after the Fleets enema, he was able to pass quite a bit of flatus  and began feeling better.  He continued to move air throughout the night,  and the following morning was feeling significantly better and was ready for  discharge.  He did relate that he has been having difficulty with chronic  constipation.  We advanced his diet.  He tolerated this without difficulty  and was allowed  discharge to home on April 30, 2003, with instructions to  follow up with Dr. Jarold Long in the office in one month, to call for any  problems in the interim.  He was to continue his usual home meds, aspirin  one p.o. daily,  Diclofenac 75 mg daily, Proscar 5 mg daily, Lisinopril 20 mg daily, and  Amaryl 1 mg daily.  We added MiraLax 17 g daily. in 6 to 8 ounces of water  for his chronic constipation symptoms.   CONDITION ON DISCHARGE:  Stable and improved.     Eugene Long, P.A.-C. [facility]                Lina Sar, M.D. [facility]    AE/MEDQ  D:  05/01/2003  T:  05/01/2003  Job:  [id number]

## 2010-10-07 NOTE — Assessment & Plan Note (Signed)
[facility] HEALTHCARE                            CARDIOLOGY OFFICE NOTE   Eugene, Long                       MRN:          [id number]  DATE:05/28/2006                            DOB:          1918-01-26    Eugene Long is in for followup.  In general, he has been stable.  He is  overall getting along reasonably well.   EXAMINATION:  Today, the blood pressure is 119/61 and the pulse is 62.  The lung fields are clear.  There is a honking murmur noted at the left sternal edge and radiating  to the left axilla; it almost sounds like a rub, but likely could  represent mitral prolapse.  There is a questionable diastolic component.  The extremities reveal no edema.  Importantly, the neck veins are not  distended.   The patient has been perfectly stable.  He has a permanent pacemaker.  We are going to go ahead and get a chest x-ray and repeat his  echocardiogram.  Previous catheterization suggested mild mitral valve  regurgitation.  I think we need to see what this looks like currently,  however.  We will make arrangements for this to be done in the morning.     Arturo Morton. Riley Kill, MD, [facility]  Electronically Signed    TDS/MedQ  DD: 05/28/2006  DT: 05/29/2006  Job #: [id number]

## 2010-10-07 NOTE — Assessment & Plan Note (Signed)
[facility] HEALTHCARE                            CARDIOLOGY OFFICE NOTE   Eugene Long, Eugene Long                       MRN:          [id number]  DATE:04/09/2006                            DOB:          Feb 29, 1918    Eugene Long is in for a followup visit.  In general, he has been stable.  He has not been having any chest pain or further shortness of breath.  His blood pressures at home have been slightly higher.  We have brought  him off of lisinopril previously.  He says that, in the past, he has  been taking up to 20, but this was cut back to 5.  His creatinine is  mildly elevated.  Dr. Jonny Ruiz has prescribed this.   The blood pressure is 160/69, the pulse is 60.  The lung fields are clear.  CARDIAC:  Rhythm is regular without a significant murmur.   EKG:  Atrial ventricular pacing.   IMPRESSION:  1. Prior pacemaker implantation.  2. 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   PLAN:  1. Continue aspirin at 81 mg daily.  2. Discussion today regarding Plavix.  He will continue this for up to      a year.  He has had some mild itching without significant rash.  3. Resume lisinopril, and the patient will take a 40 mg tablet and cut      it into 4 pieces with 10 mg daily.  4. Check BMET today in the office.     Eugene Morton. Riley Kill, MD, [facility]  Electronically Signed    TDS/MedQ  DD: 04/09/2006  DT: 04/09/2006  Job #: [id number]

## 2010-10-07 NOTE — Assessment & Plan Note (Signed)
[facility] HEALTHCARE                              CARDIOLOGY OFFICE NOTE   MARQUETTE, BLODGETT                       MRN:          [id number]  DATE:02/15/2006                            DOB:          January 07, 1918    [name] is in for post hospital visit. He was recently admitted to the  hospital and underwent diagnostic catheterization following elevated  enzymes. He is now feeling much better. Dr. Juanda Chance thought that he had  takotsubo cardiomyopathy . Since that time he now has improved. Feeling  better. Since that time he has had a repeat echocardiogram that demonstrated  ejection fraction in the range of about 60%. There was mild increase in the  aortic valve thickness and mild mitral valve regurgitation.   CURRENT MEDICATIONS:  1. Hydroxyzine 10 mg daily.  2. Simvastatin 40 mg daily which was increased because of LDL of 80.  3. Lisinopril 5 mg daily.  4. Glimepiride 2 mg daily.  5. Pantoprazole 40 mg daily.  6. Finasteride 5 mg daily.  7. Amlodipine 2.5 daily.  8. Metoprolol 25 mg daily.  9. Plavix 75 mg daily.  10.Aspirin 325 mg daily.  11.Stool softener daily.   PHYSICAL EXAMINATION:  GENERAL:  He is an alert oriented gentleman in no  acute distress.  VITAL SIGNS:  Blood pressure 125/60, pulse 60.  LUNGS:  Lung fields are clear.  HEART:  The pacer site looks good.  EXTREMITIES:  The groin has healed well.   The electrocardiogram demonstrates atrial ventricular pacing.   IMPRESSION:  1. Status post pacemaker implantation.  2. Recent hospitalization for takotsubo cardiomyopathy with positive      enzymes.   PLAN:  1. Decrease aspirin to 81 mg daily.  2. Continue simvastatin at 40 mg daily. Prescription written.  3. Discontinue lisinopril after discussion with the patient because of low      dose.  4. Continue current medications.  5. Return to clinic in six weeks.       Arturo Morton. Riley Kill, MD, [facility]     TDS/MedQ  DD:   02/15/2006  DT:  02/17/2006  Job #:  [id number]

## 2010-10-07 NOTE — H&P (Signed)
NAME:  Eugene Long, Eugene Long NO.:  [id number]   MEDICAL RECORD NO.:  [id number]          PATIENT TYPE:  EMS   LOCATION:  MAJO                         FACILITY:  [facility]   PHYSICIAN:  Arturo Morton. Riley Kill, MD, FACCDATE OF BIRTH:  12-16-1917   DATE OF ADMISSION:  02/01/2006  DATE OF DISCHARGE:                                HISTORY & PHYSICAL   CHIEF COMPLAINT:  Chest pain.   HISTORY OF PRESENT ILLNESS:  This is an 77 year old male with history of  substernal chest pain and last cardiac catheterization in 2002, longstanding  bundle branch block, status post pacemaker for symptomatic bradycardia, who  was well until he was at choir practice and had substernal chest pain which  was relieved in the field with 3 sublingual nitroglycerin; this is the first  time he has required nitroglycerin in several years.   ALLERGIES:  No known drug allergies.   MEDICATIONS:  Hydroxyzine, lisinopril, simvastatin, finasteride, fluperamide  and pantoprazole.   SOCIAL HISTORY:  He lives in [place] with his wife.  No tobacco.  No alcohol.   REVIEW OF SYSTEMS:  Otherwise negative.   PHYSICAL EXAMINATION:  GENERAL:  He is a pleasant man in no apparent  distress.  NECK:  Supple.  LUNGS:  Clear to auscultation.  CARDIOVASCULAR:  Exam shows a 2/6 holosystolic murmur throughout the  precordium.  ABDOMEN:  Soft and nontender.  Positive bowel sounds.  EXTREMITIES:  No clubbing, cyanosis, or edema.   ACCESSORY CLINICAL DATA:  EKG shows intermittently paced rhythm, no acute  injury at current.   LABORATORY DATA:  Hematocrit 40.  Sodium 134, potassium 4.2, chloride 102,  bicarb 24, BUN 25, creatinine 1.4.  CK-MB 1.6, troponin I 0.17; both of  those values are point of care.   ASSESSMENT AND PLAN:  This is a 77 year old gentleman with substernal chest  pressure __________  diagnosis of coronary artery disease, who now presents  with substernal chest pain.  We will admit the patient,  use  intravenous heparin, nitroglycerin paste, metoprolol, aspirin and statins.  Consider functional data if enzymes are negative.  He may require cardiac  catheterization if workup demonstrates non-ST-segment-elevation myocardial  infarction.      Daniel B. Haithcock, MD   Electronically Signed     ______________________________  Arturo Morton. Riley Kill, MD, [facility]    DBH/MEDQ  D:  02/01/2006  T:  02/01/2006  Job:  [id number]

## 2010-10-07 NOTE — Discharge Summary (Signed)
NAME:  Eugene Long, Eugene Long NO.:  [id number]   MEDICAL RECORD NO.:  [id number]          PATIENT TYPE:  INP   LOCATION:  2035                         FACILITY:  [facility]   PHYSICIAN:  Gerrit Friends. Dietrich Pates, MD, FACCDATE OF BIRTH:  [birth year]   DATE OF ADMISSION:  02/01/2006  DATE OF DISCHARGE:  02/04/2006                           DISCHARGE SUMMARY - REFERRING   DISCHARGE DIAGNOSES:  1. Non-ST segment elevated myocardial infarction.  2. 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  3. History of hyperlipidemia appears to be controlled on simvastatin.  4. History as noted below.   PROCEDURES PERFORMED:  Cardiac catheterization on February 01, 2006, by Dr.  Charlies Constable.   SUMMARY OF HISTORY:  Eugene Long is an 77 year old white male who during choir  practice developed substernal chest discomfort which was relieved in the  field with three sublingual nitroglycerin.  This is the first time he has  had chest discomfort and used nitroglycerin in several years.   PAST MEDICAL HISTORY:  1. Pacer insertion secondary to bradycardia, longstanding bundle branch      block and known coronary artery disease.  Last catheterization on      March 14, 2001, showed nonobstructive coronary artery disease with EF      of 65% and 1+ MR.  2. Right carotid endarterectomy.  3. Diverticulosis.  4. Hypertension.  5. Type 2 diabetes.  6. Upper extremity DVT.  7. GERD.   LABORATORY:  Chest x-ray on the 15th did not show any active disease.  Admission H&H is 13.6 and 40, normal indices, platelets 268,000, WBCs 10.9.  PTT 38, PT 13.6.  Sodium 136, potassium 4.6, BUN 23, creatinine 1.6.  Normal  LFTs.  Total protein was slightly low at 56.  Subsequent BUN and creatinine  on the 15th were 26 and 1.5, potassium 3.9.  Initial CK-MB relative index x2  were within normal limits  Two subsequent MBs were slightly elevated at 6.9  and 6.7 with a relative index of 5.7 and 6.1, however,  CK totals were within  normal limits.  Troponins were 0.48, 0.40, 1.05 and 0.78.  Fasting lipids  showed a total cholesterol of 142, triglycerides 85, HDL 45, LDL 80.  EKGs  during his admission showed ventricular pacing.   HOSPITAL COURSE:  Eugene Long was admitted to [facility] and placed on  IV heparin.  At the time of arrival, he was pain-free and he had already  eaten that day.  Dr. Riley Kill, after review, felt that he should undergo  cardiac catheterization unless he has reoccurring discomfort.  Later that  day, he had more chest discomfort.  EKG showed ventricular pacing.  Thus, he  was taken to the catheterization lab for further evaluation.  He was placed  on Integrilin prior to cath.  Catheterization performed by Dr. Riley Kill  showed a 30% proximal LAD, 40% mid LAD with systolic compression, 40%  proximal RCA, EF was 40% with a ballooning apex.  LV gram showed apical  akinesis.  EF was 40%.  Dr. Juanda Chance felt findings were compatible with  takotsubo cardiomyopathy, however the  patient does not give any history of  stress factors.  Dr. Juanda Chance recommended to continue his home medications in  addition to a beta-blocker.  Dr. Eden Emms questioned the use of Coumadin for  apical wall motion abnormality, however the patient did not have any  evidence of clot on LV gram.  By September 15, the patient was ambulating  without difficulty.  Catheterization site was intact.  By September 16, Dr.  Dietrich Pates felt that Eugene Long could be discharged home.   DISPOSITION:  Eugene Long was asked to maintain a low salt/fat/cholesterol  diet.  His activities in regards to lifting, driving, sexual activity and  heavy exertion restricted for 1 week.  Wound care in regards to his  catheterization site per supplemental sheet.  He was asked to bring all  medications to all appointments.   MEDICATIONS:  1. Norvasc 2.5 mg daily.  2 . Metoprolol 25 mg twice daily.  1. Plavix 75 mg daily.  2. Nitroglycerin  0.4 as needed.  3. He was asked to continue aspirin 325 mg daily.  4. Amaryl 2 mg daily.  5. Zocor 40 mg at bedtime.  6. Lisinopril 5 mg daily.  7. Finasteride 5 mg daily.  8. Protonix 40 mg daily.   Arrangements will be made for an echocardiogram this week to further  evaluate his apex and LV function.  He will follow up with Dr. Riley Kill in  the next 1-2 weeks.  The office will call him with these arrangements.  Discharge time less than 30 minutes.     ______________________________  Joellyn Rued, PA-C      Gerrit Friends. Dietrich Pates, MD, [facility]  Electronically Signed    EW/MEDQ  D:  02/04/2006  T:  02/05/2006  Job:  [id number]   cc:   Arturo Morton. Riley Kill, MD, [facility]  Corwin Levins, MD  Doylene Canning. Ladona Ridgel, MD

## 2010-10-07 NOTE — Op Note (Signed)
NAME:  Eugene Long, Eugene Long NO.:  [id number]   MEDICAL RECORD NO.:  [id number]                   PATIENT TYPE:  OIB   LOCATION:  3733                                 FACILITY:  [facility]   PHYSICIAN:  Doylene Canning. Ladona Ridgel, M.D.               DATE OF BIRTH:  [birth year]   DATE OF PROCEDURE:  01/14/2004  DATE OF DISCHARGE:                                 OPERATIVE REPORT   PROCEDURE PERFORMED:  Insertion of a dual chamber pacemaker.   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   INTRODUCTION:  The patient is an 77 year old male with a long history of  left bundle branch block.  I saw him initially a year ago when he was having  sudden onset of near syncopal episodes.  We initially discussed implantation  of a loop recorder but he declined.  He was in his usual state of health  until approximately three months ago when he had a sudden syncopal episode  while lying in bed.  He had a recurrent syncopal episode which occurred  several weeks ago.  Each of these was characterized by sudden onset and  offset of symptoms.  There was associated apnea.  They lasted between 15 and  20 seconds.  Because of recurrent unexplained syncope in a setting of left  bundle branch block, he is now referred for permanent pacemaker insertion.   DESCRIPTION OF PROCEDURE:  After informed consent was obtained, the patient  was taken to the diagnostic catheterization laboratory in a fasting state.  After the usual preparation and draping, intravenous fentanyl and Midazolam  were given for sedation.  A total of 30 mL of lidocaine was infiltrated into  the left infraclavicular region. A 5 cm incision was carried out over this  region and electrocautery utilized to dissect down to the fascial plane.  The left subclavian vein was subsequently punctured times two and the  Medtronic model 5076 52  cm active fixation pacing lead serial number  [id number] was advanced into the right ventricle.  Next, the Medtronic model  5076 45 cm lead serial number [id number] was advanced into the right atrium.  Mapping was carried out in the right ventricle and at the final site, the R-  waves measured 17 mV and pacing threshold of 0.6 V at 0.5 msec.  The pacing  impedance was 805 ohms.  10 V pacing did not stimulate the diaphragm.  With  the ventricular lead in satisfactory position, attention was then turned to  placement of the atrial lead.  It was placed in the right atrial appendage  where P-waves measured 6 mV and the atrial threshold was 1.5 V at 0.5 msec  with a pacing impedance of 618 ohms.  Again 10 V pacing did not stimulate  the diaphragm.  With both atrial and ventricular  leads in satisfactory  position, they were secured to the subpectoralis fascia with figure-of-eight  silk suture.  In addition, the sewing sleeve was secured with silk suture.  At this point electrocautery was then utilized to make a subcutaneous  pocket.  Kanamycin irrigation was utilized to irrigate the pocket.  Electrocautery was also utilized to assure hemostasis.  The Medtronic  Vitatron [id number] dual chamber pacemaker serial number [id number] was then  connected to the atrial and ventricular pacing leads and placed in the  subcutaneous pocket.  The generator was secured with a silk suture.  Additional Kanamycin was utilized to irrigate the pocket and the incision  closed with a layer of 2-0 Vicryl, followed by a layer of 3-0 Vicryl,  followed by a layer of 4-0 Vicryl.  Benzoin was painted on the skin.  Steri-  Strips were applied.  Pressure dressing placed and the patient returned to  his room in satisfactory condition.   COMPLICATIONS:  There were no immediate procedure complications.   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                                               Doylene Canning. Ladona Ridgel, M.D.    GWT/MEDQ  D:  01/14/2004  T:  01/15/2004  Job:  [id number]   cc:   Corwin Levins, M.D. [facility]

## 2010-10-07 NOTE — Cardiovascular Report (Signed)
[place]  Patient:    Eugene Long, Eugene Long Visit Number: [id number] MRN: [id number]          Service Type: CAT Location: [facility] 2861 01 Attending Physician:  Ronaldo Miyamoto Dictated by:   Veneda Melter, M.D. Proc. Date: 03/14/01 Admit Date:  03/14/2001 Discharge Date: 03/14/2001   CC:         Maisie Fus D. Riley Kill, M.D. [facility]  Corwin Levins, M.D. [facility]   Cardiac Catheterization  PROCEDURE: 1. Left heart catheterization 2. Left ventriculogram. 3. Selective coronary angiography. 4. Perclose of right femoral artery.  DIAGNOSES: 1. Mild coronary artery disease by angiogram. 2. Normal left ventricular systolic function. 3. 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  CARDIOLOGIST:  Veneda Melter, M.D.  HISTORY:  [name] is an 77 year old gentleman with no significant coronary disease who presents with substernal chest discomfort.  The patient had previously undergone catheterization in 1991 at which time he was found to have normal LV function, mild disease in the LAD system.  He has been treated medically in the interim and has done very well.  The patient underwent stress imaging studies suggesting a small amount of ischemia in the distal apex.  Due to left bundle branch block, was unable to interpret ECG.  He presents now for further cardiac assessment.  TECHNIQUE:  After informed consent was obtained, the patient was brought to the catheterization lab.  A 6-French sheath was placed in the right femoral artery.  Left heart catheterization and selective coronary angiography was then performed in the usual fashion using preformed 6-French Judkins catheters.  Angiography was also performed after the administration of intracoronary nitroglycerin to further assess the LAD.  At the termination of the case, Perclose suture closure device was employed to the right femoral artery until adequate hemostasis was achieved.  The patient  tolerated the procedure well and was transferred to the floor in stable condition.  FINDINGS: 1. Left main trunk: A medium caliber vessel.  Mild distal irregularity of    10 to 20%. 2. LAD.  This is a large caliber vessel that provides medium size    first diagonal branches at the proximal segment and trivial second    diagonal branch distally.  The LAD system has mild irregularities of    20 to 30%.  There is evidence of intramyocardial bridge in the distal    section of the LAD which compromises the lumen by not greater than    30 to 40%. 3. Left circumflex artery: This is a medium caliber vessel that provides    three marginal branches.  There are mild irregularities in the left    circumflex system. 4. Right coronary artery is dominant.  This is a medium caliber vessel that    provides the posterior descending artery and several small posterior    ventricular branches in its terminal segment.   There was mild    irregularities of 30% in the mid right coronary artery. 5. LV.  Normal end-systolic and end-diastolic dimensions.  Overall left    ventricular function is well preserved.  Ejection fraction greater than    65%.  There is 1+ mitral regurgitation.  LV pressure 132/70.  Aortic    pressure 130/54.  LV EDP equal 16.  ASSESSMENT/PLAN:  [name] is an 77 year old gentleman with noncritical coronary artery disease and normal left ventricular function.  Continued medical therapy will be recommended, and patient treated with vasodilators as necessary for symptoms. Dictated by:  Veneda Melter, M.D. Attending Physician:  Ronaldo Miyamoto DD:  03/14/01 TD:  03/16/01 Job: [id number]

## 2010-10-07 NOTE — Assessment & Plan Note (Signed)
[facility]                           ELECTROPHYSIOLOGY OFFICE NOTE   Eugene Long, Eugene Long                       MRN:          [id number]  DATE:12/25/2005                            DOB:          November 23, 1917    Eugene Long is seen in device clinic today, December 25, 2005, for routine  followup of his Vitatron T-60 dual-chamber pacemaker implanted August 2005  for syncope.  On interrogation, battery voltage is good.  In the atrium,  intrinsic amplitude is 4 mV, with an impedance of 550 ohms, threshold of  0.625 V at 0.4 msec.  In the right ventricle, intrinsic amplitude is greater  than 15 mV, with an impedance of 650 ohms, and threshold of 0.875 V at 0.4  msec.  No changes were made at this time.  Eugene Long will start  transtelephonic monitoring through [employer] as soon as possible, and be seen  in one year's time for followup.                                   Cleatrice Burke, RN                                Doylene Canning. Ladona Ridgel, MD   CF/MedQ  DD:  12/25/2005  DT:  12/25/2005  Job #:  [id number]

## 2010-10-07 NOTE — H&P (Signed)
NAME:  Eugene Long, Eugene Long NO.:  [id number]   MEDICAL RECORD NO.:  [id number]                   PATIENT TYPE:  INP   LOCATION:  0449                                 FACILITY:  [facility]   PHYSICIAN:  Lina Sar, M.D. [facility]               DATE OF BIRTH:  March 28, 1918   DATE OF ADMISSION:  04/29/2003  DATE OF DISCHARGE:                                HISTORY & PHYSICAL   CHIEF COMPLAINT:  Abdominal pain and distention post colonoscopy earlier  today.   HISTORY:  Eugene Long is a pleasant 77 year old white male known to Corwin Levins, M.D. [facility] who was referred for routine colonoscopy for follow-up of  adenomatous colon polyps.  He underwent colonoscopy with Vania Rea. Jarold Motto,  M.D. [facility] at the [facility].  This shows an unremarkable examination with the  exception of sigmoid diverticulosis.  He had some gaseous discomfort at the  time of discharge.  Had been given some Levsin sublingual with minimal  improvement; however, felt well enough to go home and was given some  additional Levsin to use at home.  This afternoon he has continued with  abdominal discomfort which has become somewhat progressive, associated with  distention, nausea, and inability to pass gas.  He was advised to come to  the emergency room for abdominal films.  He says he had been having constant  pain for several hours which is now more intermittent and somewhat crampy.  Abdominal films done in the emergency room showed gaseous distention of the  colon consistent with ileus, no free air.  He is admitted for supportive  management.   MEDICATIONS:  1. Aspirin one p.o. daily.  2. Diclofenac 75 daily.  3. Proscar 5 mg daily.  4. Lisinopril 20 mg daily.  5. Amaryl 1 mg daily.   ALLERGIES:  CODEINE.   PAST MEDICAL HISTORY:  1. Bilateral arthroscopies of the knees.  2. He has had a right carotid endarterectomy.  3. Appendectomy.  4. Hypertension.  5. BPH.   FAMILY HISTORY:  Negative for GI.   SOCIAL HISTORY:  The patient is married.  He and his wife live at [facility].  He is retired.  No tobacco.  No ETOH.   REVIEW OF SYSTEMS:  Reviewed and negative other than outlined above.   PHYSICAL EXAMINATION:  GENERAL:  Well-developed elderly white male in no  acute distress.  VITAL SIGNS:  Temperature 98.3, blood pressure 152/77, pulse 94,  respirations 18, saturations 99%.  HEENT:  Normocephalic, atraumatic.  EOMI.  PERLA.  Sclerae anicteric.  Eyes:  Status post right carotid endarterectomy.  CARDIOVASCULAR:  Regular rate and rhythm with S1 and S2.  No murmurs, rubs,  or gallops.  PULMONARY:  Clear to A&P.  ABDOMEN:  Somewhat distended.  Bowel sounds are present with a few high  pitched tinkling bowel sounds.  He has mild diffuse tenderness and tympany.  RECTAL:  Not done at this time.  EXTREMITIES:  Without clubbing, cyanosis, edema.   IMPRESSION:  1. An 77 year old white male with colonic ileus post colonoscopy on same     day.  2. Chronic constipation.  3. Other diagnoses as listed above.   PLAN:  The patient is admitted to 24-hour evaluation for bowel rest, IV  fluids.  He will be given IV Reglan.  Will give him a Fleet's enema and  place a rectal tube to facilitate passage of retained gas and follow it  expectantly.  Hopefully, he will be able to be discharged home on December  9.  For details, please see the orders.     Mike Gip, P.A.-C. [facility]                Lina Sar, M.D. [facility]    AE/MEDQ  D:  04/30/2003  T:  04/30/2003  Job:  [id number]

## 2010-10-31 ENCOUNTER — Encounter: Payer: Self-pay | Admitting: Internal Medicine

## 2010-10-31 DIAGNOSIS — I495 Sick sinus syndrome: Secondary | ICD-10-CM

## 2011-01-30 ENCOUNTER — Encounter: Payer: Self-pay | Admitting: Internal Medicine

## 2011-01-30 DIAGNOSIS — R55 Syncope and collapse: Secondary | ICD-10-CM

## 2011-05-01 ENCOUNTER — Encounter: Payer: Self-pay | Admitting: Internal Medicine

## 2011-05-01 DIAGNOSIS — I495 Sick sinus syndrome: Secondary | ICD-10-CM

## 2011-07-31 ENCOUNTER — Encounter: Payer: Self-pay | Admitting: Internal Medicine

## 2011-07-31 DIAGNOSIS — I498 Other specified cardiac arrhythmias: Secondary | ICD-10-CM

## 2011-09-20 ENCOUNTER — Other Ambulatory Visit: Payer: Self-pay

## 2011-10-04 ENCOUNTER — Encounter: Payer: Self-pay | Admitting: Internal Medicine

## 2011-10-04 ENCOUNTER — Ambulatory Visit (INDEPENDENT_AMBULATORY_CARE_PROVIDER_SITE_OTHER): Payer: Medicare Other | Admitting: Internal Medicine

## 2011-10-04 VITALS — BP 138/58 | HR 60 | Ht 68.0 in | Wt 151.0 lb

## 2011-10-04 DIAGNOSIS — I459 Conduction disorder, unspecified: Secondary | ICD-10-CM

## 2011-10-04 DIAGNOSIS — Z950 Presence of cardiac pacemaker: Secondary | ICD-10-CM

## 2011-10-04 DIAGNOSIS — I2589 Other forms of chronic ischemic heart disease: Secondary | ICD-10-CM

## 2011-10-04 LAB — PACEMAKER DEVICE OBSERVATION
AL AMPLITUDE: 2.5 mv
AL THRESHOLD: 0.75 V
RV LEAD IMPEDENCE PM: 500 Ohm
RV LEAD THRESHOLD: 1.5 V

## 2011-10-04 NOTE — Assessment & Plan Note (Signed)
His device is working normally. He is 1-2 years out from pacemaker generator change.

## 2011-10-04 NOTE — Assessment & Plan Note (Addendum)
His catheterization several years ago demonstrated nonobstructive coronary disease. His symptoms are currently well controlled.

## 2011-10-04 NOTE — Assessment & Plan Note (Signed)
He has had no recurrent syncope since his pacemaker was placed. He will undergo watchful waiting. 

## 2011-10-04 NOTE — Progress Notes (Signed)
HPI Eugene Long returns today for followup. He is a very pleasant 78 year old man with a history of complete heart block status post permanent pacemaker insertion. He has a history of dyslipidemia, and hypertension. He has not obstructive coronary disease and modest left ventricular dysfunction. His heart failure symptoms are class I. He denies chest pain, shortness of breath, or peripheral edema. He recently lost his wife. No syncope. Allergies  Allergen Reactions  . Codeine   . Glyburide      Current Outpatient Prescriptions  Medication Sig Dispense Refill  . amLODipine (NORVASC) 2.5 MG tablet Take 2.5 mg by mouth daily.      Marland Kitchen aspirin 81 MG tablet Take 81 mg by mouth daily.      . cetirizine (ZYRTEC) 5 MG chewable tablet Chew 5 mg by mouth daily.      . cholecalciferol (VITAMIN D) 1000 UNITS tablet Take 1,000 Units by mouth daily.      Marland Kitchen docusate sodium (COLACE) 100 MG capsule Take 100 mg by mouth 2 (two) times daily.      . finasteride (PROSCAR) 5 MG tablet Take 5 mg by mouth daily.      Marland Kitchen glimepiride (AMARYL) 1 MG tablet Take 1 mg by mouth daily before breakfast.      . ibuprofen (ADVIL,MOTRIN) 200 MG tablet Take 200 mg by mouth every 6 (six) hours as needed.      Marland Kitchen omeprazole (PRILOSEC) 40 MG capsule Take 40 mg by mouth daily.      . pravastatin (PRAVACHOL) 40 MG tablet Take 40 mg by mouth daily.      . sertraline (ZOLOFT) 50 MG tablet Take 50 mg by mouth daily.         No past medical history on file.  ROS:   All systems reviewed and negative except as noted in the HPI.   No past surgical history on file.   No family history on file.   History   Social History  . Marital Status: Married    Spouse Name: N/A    Number of Children: N/A  . Years of Education: N/A   Occupational History  . Not on file.   Social History Main Topics  . Smoking status: Former Games developer  . Smokeless tobacco: Not on file  . Alcohol Use: Not on file  . Drug Use: Not on file  . Sexually  Active: Not on file   Other Topics Concern  . Not on file   Social History Narrative  . No narrative on file     BP 138/58  Pulse 60  Ht 5\' 8"  (1.727 m)  Wt 151 lb (68.493 kg)  BMI 22.96 kg/m2  Physical Exam:  Well appearing elderly man, NAD HEENT: Unremarkable Neck:  No JVD, no thyromegally Lungs:  Clear with no wheezes, rales, or rhonchi. HEART:  Regular rate rhythm, no murmurs, no rubs, no clicks Abd:  soft, positive bowel sounds, no organomegally, no rebound, no guarding Ext:  2 plus pulses, no edema, no cyanosis, no clubbing Skin:  No rashes no nodules Neuro:  CN II through XII intact, motor grossly intact  DEVICE  Normal device function.  See PaceArt for details.   Assess/Plan:

## 2011-10-04 NOTE — Patient Instructions (Addendum)
Your physician wants you to follow-up in: 6 months with Kristin/Paula in the device clinic & 1 year with Dr. Taylor. You will receive a reminder letter in the mail two months in advance. If you don't receive a letter, please call our office to schedule the follow-up appointment.  Your physician recommends that you continue on your current medications as directed. Please refer to the Current Medication list given to you today.  

## 2012-01-04 DIAGNOSIS — I498 Other specified cardiac arrhythmias: Secondary | ICD-10-CM

## 2012-01-25 ENCOUNTER — Encounter: Payer: Self-pay | Admitting: *Deleted

## 2012-01-25 ENCOUNTER — Encounter: Payer: Self-pay | Admitting: Internal Medicine

## 2012-03-06 ENCOUNTER — Telehealth: Payer: Self-pay | Admitting: Internal Medicine

## 2012-03-06 NOTE — Telephone Encounter (Signed)
New Problem:    Patient called in wanting to be seen as soon as possible because his heart is skipping beats to the point where his vision gets blurry.  Please call back.

## 2012-03-06 NOTE — Telephone Encounter (Signed)
Spoke with patient and we will move his appointment to 03/12/12 at 12:00pm

## 2012-03-12 ENCOUNTER — Encounter: Payer: Self-pay | Admitting: *Deleted

## 2012-03-12 ENCOUNTER — Encounter: Payer: Self-pay | Admitting: Internal Medicine

## 2012-03-12 ENCOUNTER — Ambulatory Visit (INDEPENDENT_AMBULATORY_CARE_PROVIDER_SITE_OTHER): Payer: Medicare Other | Admitting: Internal Medicine

## 2012-03-12 VITALS — BP 144/60 | HR 64 | Ht 68.0 in | Wt 152.8 lb

## 2012-03-12 DIAGNOSIS — R002 Palpitations: Secondary | ICD-10-CM

## 2012-03-12 DIAGNOSIS — Z950 Presence of cardiac pacemaker: Secondary | ICD-10-CM

## 2012-03-12 DIAGNOSIS — I428 Other cardiomyopathies: Secondary | ICD-10-CM

## 2012-03-12 DIAGNOSIS — E785 Hyperlipidemia, unspecified: Secondary | ICD-10-CM

## 2012-03-12 DIAGNOSIS — I459 Conduction disorder, unspecified: Secondary | ICD-10-CM

## 2012-03-12 LAB — PACEMAKER DEVICE OBSERVATION
AL AMPLITUDE: 2.5 mv
RV LEAD AMPLITUDE: 5.5 mv
RV LEAD IMPEDENCE PM: 450 Ohm
RV LEAD THRESHOLD: 1.5 V

## 2012-03-12 MED ORDER — METOPROLOL TARTRATE 25 MG PO TABS: 37.5000 mg | ORAL_TABLET | Freq: Two times a day (BID) | ORAL | Status: DC

## 2012-03-12 NOTE — Patient Instructions (Addendum)
Your physician recommends that you schedule a follow-up appointment in: with device clinic in 2 months and with Dr. Ladona Ridgel in 4 months. You will schedule the device clinic appointment today before you leave.   Your physician has recommended you make the following change in your medication:  Increase Metoprolol to 1 and 1/2 tablets twice daily.

## 2012-03-12 NOTE — Progress Notes (Signed)
HPI [name] returns today for followup. He is a very pleasant 78 year old man with a history of symptomatic bradycardia, status post permanent pacemaker insertion. Despite his advanced age, he continues to do well. His only complaint today is that of occasional palpitations which are associated with shortness of breath. These do not occur every day, and last only a few minutes. No syncope. The patient has not changed his medical therapy, and denies abuse of caffeine. No peripheral edema. Allergies  Allergen Reactions  . Codeine   . Glyburide      Current Outpatient Prescriptions  Medication Sig Dispense Refill  . amLODipine (NORVASC) 2.5 MG tablet Take 2.5 mg by mouth daily.      Marland Kitchen aspirin 81 MG tablet Take 81 mg by mouth daily.      . cetirizine (ZYRTEC) 5 MG chewable tablet Chew 5 mg by mouth daily.      . cholecalciferol (VITAMIN D) 1000 UNITS tablet Take 1,000 Units by mouth daily.      Marland Kitchen docusate sodium (COLACE) 100 MG capsule Take 100 mg by mouth 2 (two) times daily.      . Emollient (VASELINE INTENSIVE CARE EX) Apply 1 application topically as needed.      . finasteride (PROSCAR) 5 MG tablet Take 5 mg by mouth daily.      Marland Kitchen glimepiride (AMARYL) 1 MG tablet Take 1 mg by mouth daily before breakfast.      . ibuprofen (ADVIL,MOTRIN) 200 MG tablet Take 200 mg by mouth every 6 (six) hours as needed.      . metoprolol tartrate (LOPRESSOR) 25 MG tablet Take 1.5 tablets (37.5 mg total) by mouth 2 (two) times daily.  90 tablet  4  . omeprazole (PRILOSEC) 40 MG capsule Take 40 mg by mouth daily.      . pravastatin (PRAVACHOL) 40 MG tablet Take 40 mg by mouth daily.      . sertraline (ZOLOFT) 50 MG tablet Take 50 mg by mouth daily.      Marland Kitchen DISCONTD: metoprolol tartrate (LOPRESSOR) 25 MG tablet Take 25 mg by mouth 2 (two) times daily.          Past Medical History  Diagnosis Date  . Benign neoplasm of colon   . Depressive disorder, not elsewhere classified   . Osteoarthrosis, unspecified  whether generalized or localized, unspecified site   . Complete rupture of rotator cuff   . Peripheral vascular disease, unspecified   . Other specified forms of chronic ischemic heart disease   . Acute venous embolism and thrombosis of unspecified deep vessels of lower extremity   . Esophageal reflux   . Conduction disorder, unspecified   . Conduction disorder, unspecified   . Other and unspecified hyperlipidemia   . Diabetes mellitus   . BPH (benign prostatic hypertrophy)   . Diverticulosis     colon, Hx of  . Presence of permanent cardiac pacemaker   . Hypertension     ROS:   All systems reviewed and negative except as noted in the HPI.   Past Surgical History  Procedure Date  . Knee arthroscopy   . Appendectomy   . Carotid endarterectomy     right  . Insert / replace / remove pacemaker      No family history on file.   History   Social History  . Marital Status: Married    Spouse Name: N/A    Number of Children: N/A  . Years of Education: N/A   Occupational History  .  Not on file.   Social History Main Topics  . Smoking status: Former Smoker -- 0.5 packs/day for 17 years    Types: Cigarettes    Quit date: 05/23/1959  . Smokeless tobacco: Not on file  . Alcohol Use: No  . Drug Use: No  . Sexually Active: Not on file   Other Topics Concern  . Not on file   Social History Narrative  . No narrative on file     BP 144/60  Pulse 64  Ht 5\' 8"  (1.727 m)  Wt 152 lb 12.8 oz (69.31 kg)  BMI 23.23 kg/m2  Physical Exam:  Well appearing elderly man, NAD HEENT: Unremarkable Neck:  No JVD, no thyromegally Lungs:  Clear with no wheezes, rales, or rhonchi. HEART:  Regular rate rhythm, no murmurs, no rubs, no clicks Abd:  soft, positive bowel sounds, no organomegally, no rebound, no guarding Ext:  2 plus pulses, no edema, no cyanosis, no clubbing Skin:  No rashes no nodules Neuro:  CN II through XII intact, motor grossly intact  DEVICE  Normal device  function.  See PaceArt for details.   Assess/Plan:

## 2012-03-12 NOTE — Assessment & Plan Note (Signed)
His permanent pacemaker is working normally. He is approaching, but not yet at elective replacement. We'll recheck in several months.

## 2012-03-12 NOTE — Assessment & Plan Note (Signed)
His symptoms are new and most likely related to PVCs, although I cannot exclude PACs or even atrial fibrillation. I've asked the patient to up titrate his metoprolol to 25 mg, one a half tablets twice daily. Should this not be helpful in relieving his symptoms, we could consider additional antiarrhythmic drug therapy.

## 2012-03-12 NOTE — Assessment & Plan Note (Signed)
The patient's blood pressure is slightly elevated today. Hopefully the addition of more metoprolol will help improve his blood pressure. He is instructed to maintain a low-sodium diet.

## 2012-03-20 ENCOUNTER — Encounter: Payer: Medicare Other | Admitting: Internal Medicine

## 2012-04-15 ENCOUNTER — Other Ambulatory Visit: Payer: Self-pay | Admitting: Internal Medicine

## 2012-04-15 DIAGNOSIS — R002 Palpitations: Secondary | ICD-10-CM

## 2012-04-15 MED ORDER — METOPROLOL TARTRATE 25 MG PO TABS: 37.5000 mg | ORAL_TABLET | Freq: Two times a day (BID) | ORAL | Status: DC

## 2012-04-15 NOTE — Telephone Encounter (Signed)
Pt needs refill

## 2012-04-26 ENCOUNTER — Telehealth: Payer: Self-pay | Admitting: Internal Medicine

## 2012-04-26 NOTE — Telephone Encounter (Signed)
Pt has concerns for Rx for his metaprolol and the Rx has been sent in wrong and he only has 8 days left of med

## 2012-05-09 ENCOUNTER — Encounter: Payer: Self-pay | Admitting: Internal Medicine

## 2012-05-09 ENCOUNTER — Ambulatory Visit (INDEPENDENT_AMBULATORY_CARE_PROVIDER_SITE_OTHER): Payer: Medicare Other | Admitting: *Deleted

## 2012-05-09 DIAGNOSIS — I459 Conduction disorder, unspecified: Secondary | ICD-10-CM

## 2012-05-09 LAB — PACEMAKER DEVICE OBSERVATION

## 2012-05-09 NOTE — Progress Notes (Signed)
PPM check 

## 2012-06-13 ENCOUNTER — Encounter: Payer: Self-pay | Admitting: Geriatric Medicine

## 2012-06-13 DIAGNOSIS — R0602 Shortness of breath: Secondary | ICD-10-CM | POA: Insufficient documentation

## 2012-06-13 DIAGNOSIS — M720 Palmar fascial fibromatosis [Dupuytren]: Secondary | ICD-10-CM | POA: Insufficient documentation

## 2012-07-11 ENCOUNTER — Ambulatory Visit (INDEPENDENT_AMBULATORY_CARE_PROVIDER_SITE_OTHER): Payer: Medicare Other | Admitting: Internal Medicine

## 2012-07-11 ENCOUNTER — Encounter: Payer: Self-pay | Admitting: Internal Medicine

## 2012-07-11 VITALS — BP 145/49 | HR 60 | Ht 68.0 in | Wt 155.2 lb

## 2012-07-11 DIAGNOSIS — I10 Essential (primary) hypertension: Secondary | ICD-10-CM

## 2012-07-11 DIAGNOSIS — R002 Palpitations: Secondary | ICD-10-CM

## 2012-07-11 DIAGNOSIS — Z950 Presence of cardiac pacemaker: Secondary | ICD-10-CM

## 2012-07-11 MED ORDER — METOPROLOL TARTRATE 25 MG PO TABS: 37.5000 mg | ORAL_TABLET | Freq: Two times a day (BID) | ORAL | Status: DC

## 2012-07-11 NOTE — Assessment & Plan Note (Signed)
His blood pressure is well compensated. I've encouraged the patient to continue his current medical therapy, and maintain a low-sodium diet.

## 2012-07-11 NOTE — Progress Notes (Signed)
HPI Eugene Long returns today for followup. He is a very pleasant 79 year old man with complete heart block, status post permanent pacemaker insertion. He also is a history of hypertension, diabetes, and dyslipidemia. Despite his advanced age, he continues to do well. He denies chest pain, shortness of breath, or peripheral edema. Allergies  Allergen Reactions  . Codeine   . Glyburide   . Uroxatral (Alfuzosin)      Current Outpatient Prescriptions  Medication Sig Dispense Refill  . amLODipine (NORVASC) 2.5 MG tablet Take 2.5 mg by mouth daily.      Marland Kitchen aspirin 81 MG tablet Take 81 mg by mouth daily.      . cetirizine (ZYRTEC) 5 MG chewable tablet Chew 5 mg by mouth daily.      . cholecalciferol (VITAMIN D) 1000 UNITS tablet Take 1,000 Units by mouth daily.      Marland Kitchen docusate sodium (COLACE) 100 MG capsule Take 100 mg by mouth 2 (two) times daily.      . Emollient (VASELINE INTENSIVE CARE EX) Apply 1 application topically as needed.      . finasteride (PROSCAR) 5 MG tablet Take 5 mg by mouth daily.      Marland Kitchen glimepiride (AMARYL) 1 MG tablet Take 1 mg by mouth daily before breakfast.      . ibuprofen (ADVIL,MOTRIN) 200 MG tablet Take 200 mg by mouth every 6 (six) hours as needed.      . metoprolol tartrate (LOPRESSOR) 25 MG tablet Take 1.5 tablets (37.5 mg total) by mouth 2 (two) times daily.  270 tablet  3  . omeprazole (PRILOSEC) 40 MG capsule Take 40 mg by mouth daily.      . pravastatin (PRAVACHOL) 40 MG tablet Take 40 mg by mouth daily.      . sertraline (ZOLOFT) 50 MG tablet Take 50 mg by mouth daily.       No current facility-administered medications for this visit.     Past Medical History  Diagnosis Date  . Benign neoplasm of colon   . Depressive disorder, not elsewhere classified   . Osteoarthrosis, unspecified whether generalized or localized, unspecified site   . Complete rupture of rotator cuff   . Peripheral vascular disease, unspecified   . Other specified forms of chronic  ischemic heart disease   . Acute venous embolism and thrombosis of unspecified deep vessels of lower extremity   . Esophageal reflux   . Conduction disorder, unspecified   . Conduction disorder, unspecified   . Other and unspecified hyperlipidemia   . Diabetes mellitus   . BPH (benign prostatic hypertrophy)   . Diverticulosis     colon, Hx of  . Presence of permanent cardiac pacemaker   . Hypertension     ROS:   All systems reviewed and negative except as noted in the HPI.   Past Surgical History  Procedure Laterality Date  . Knee arthroscopy    . Appendectomy    . Carotid endarterectomy      right  . Insert / replace / remove pacemaker    . Tonsillectomy  1931  . Arthoscopy right knee  1980  . Carotid endarterectomy  1987    right  . Arthoscopy  1993    left knee  . Cataract extraction  [id number]  . 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 Cancer Father   . Heart disease Sister  CHF     History   Social History  . Marital Status: Married    Spouse Name: N/A    Number of Children: N/A  . Years of Education: N/A   Occupational History  . Not on file.   Social History Main Topics  . Smoking status: Former Smoker -- 0.50 packs/day for 17 years    Types: Cigarettes    Quit date: 05/23/1959  . Smokeless tobacco: Not on file  . Alcohol Use: No  . Drug Use: No  . Sexually Active: No   Other Topics Concern  . Not on file   Social History Narrative  . No narrative on file     BP 145/49  Pulse 60  Ht 5\' 8"  (1.727 m)  Wt 155 lb 3.2 oz (70.398 kg)  BMI 23.6 kg/m2  Physical Exam:  Well appearing NAD HEENT: Unremarkable Neck:  7 cm JVD, no thyromegally Lungs:  Clear with no wheezes, rales, or rhonchi. Well-healed pacemaker incision. HEART:  Regular rate rhythm, no murmurs, no rubs, no clicks Abd:  soft, positive bowel sounds, no organomegally, no rebound, no guarding Ext:  2  plus pulses, no edema, no cyanosis, no clubbing Skin:  No rashes no nodules Neuro:  CN II through XII intact, motor grossly intact   DEVICE  Normal device function.  See PaceArt for details. He is approaching elective replacement  Assess/Plan:

## 2012-07-11 NOTE — Patient Instructions (Signed)
Your physician wants you to follow-up in: 12 months with Dr. Taylor. You will receive a reminder letter in the mail two months in advance. If you don't receive a letter, please call our office to schedule the follow-up appointment.    

## 2012-07-11 NOTE — Assessment & Plan Note (Signed)
His [employer]onic Vitatron dual-chamber pacemaker is working normally.we'll plan to recheck in several months. He is approaching elective replacement.

## 2012-08-12 ENCOUNTER — Encounter: Payer: Self-pay | Admitting: Geriatric Medicine

## 2012-08-13 ENCOUNTER — Encounter: Payer: Self-pay | Admitting: Internal Medicine

## 2012-08-13 ENCOUNTER — Non-Acute Institutional Stay: Payer: Medicare Other | Admitting: Internal Medicine

## 2012-08-13 VITALS — BP 132/64 | HR 60 | Wt 153.0 lb

## 2012-08-13 DIAGNOSIS — E119 Type 2 diabetes mellitus without complications: Secondary | ICD-10-CM

## 2012-08-13 DIAGNOSIS — E785 Hyperlipidemia, unspecified: Secondary | ICD-10-CM

## 2012-08-13 DIAGNOSIS — I10 Essential (primary) hypertension: Secondary | ICD-10-CM

## 2012-08-13 DIAGNOSIS — Z950 Presence of cardiac pacemaker: Secondary | ICD-10-CM

## 2012-08-13 NOTE — Patient Instructions (Signed)
Continue current meds 

## 2012-08-13 NOTE — Progress Notes (Signed)
Subjective:     Patient ID: Eugene Long, male   DOB: 23-Jan-1918, 79 y.o.   MRN: [id number]  HPI Comments: Last seen by me in 2013. Normal diabetic control. Hyperlipidemia controlled and blood pressure controlled. His pacemake was working well.  Subsequent to that visit saw Dr. Sharrell Ku on 07/11/12. He increased doses of metoprolol to 37.5 bid due to palpitations, but his symptoms are better after stopping artificial sweeteners, so he has reduced the dose to 25 mg bid on his own.  Home glucose in the evenings from 687-120    Review of Systems    Review of Systems - History obtained from patient Psychological ROS: negative Ophthalmic ROS: History of enucleation for malignant melanoma ENT ROS: negative Allergy and Immunology ROS: negative Hematological and Lymphatic ROS: negative Endocrine ROS: positive for - diabetes Breast ROS: negative for breast lumps Respiratory ROS: no cough, shortness of breath, or wheezing Cardiovascular ROS: no chest pain or dyspnea on exertion Gastrointestinal ROS: no abdominal pain, change in bowel habits, or black or bloody stools Genito-Urinary ROS: no dysuria, trouble voiding, or hematuria Musculoskeletal ROS: Chronic knee discomfort. It does not disturb walking. Neurological ROS: no TIA or stroke symptoms Dermatological ROS: positive for removal of  lesion on the back by cryotherapy by Dr. Margo Aye, March 2014 Gait: stable Objective:   Physical Exam  Constitutional: He is oriented to person, place, and time. He appears well-developed and well-nourished.  HENT:  Head: Normocephalic and atraumatic.  Mouth/Throat: No oropharyngeal exudate.  Loss of hearing. Aid in right ear.  Eyes:  Artificial left eye. Rx lenses.  Neck: Neck supple. No JVD present. No tracheal deviation present. No thyromegaly present.  Cardiovascular: Normal rate, regular rhythm, normal heart sounds and intact distal pulses.   pacemakeer  Pulmonary/Chest: Effort normal and breath  sounds normal. He has no wheezes. He has no rales.  Abdominal: Bowel sounds are normal. He exhibits no distension and no mass. There is no tenderness.  Musculoskeletal: Normal range of motion. He exhibits no edema and no tenderness.  Lymphadenopathy:    He has no cervical adenopathy.  Neurological: He is alert and oriented to person, place, and time. He displays normal reflexes. No cranial nerve deficit. He exhibits normal muscle tone. Coordination normal.  Skin: Skin is warm and dry.  Psychiatric: He has a normal mood and affect. His behavior is normal. Judgment and thought content normal.      Physical Exam  Constitutional: He is oriented to person, place, and time. He appears well-developed and well-nourished.  HENT:  Head: Normocephalic and atraumatic.  Mouth/Throat: No oropharyngeal exudate.  Loss of hearing. Aid in right ear.  Eyes:  Artificial left eye. Rx lenses.  Neck: Neck supple. No JVD present. No tracheal deviation present. No thyromegaly present.  Cardiovascular: Normal rate, regular rhythm, normal heart sounds and intact distal pulses.   pacemakeer  Respiratory: Effort normal and breath sounds normal. He has no wheezes. He has no rales.  GI: Bowel sounds are normal. He exhibits no distension and no mass. There is no tenderness.  Musculoskeletal: Normal range of motion. He exhibits no edema and no tenderness.  Lymphadenopathy:    He has no cervical adenopathy.  Neurological: He is alert and oriented to person, place, and time. He displays normal reflexes. No cranial nerve deficit. He exhibits normal muscle tone. Coordination normal.  Skin: Skin is warm and dry.  Psychiatric: He has a normal mood and affect. His behavior is normal. Judgment and thought content  normal.    Assessment:     Patient is currently doing well with control of diabetes and blood pressure. CKD is unchanged. Previous palpitations have resolved. He is to see Dr. Ladona Ridgel in a couple of months. He  anticipates there may be a battery change for his pacemaker in the future.     Plan:    continue current medications. Return in 4 months for followup of hemoglobin A1c and BMP.

## 2012-08-27 ENCOUNTER — Encounter: Payer: Self-pay | Admitting: Internal Medicine

## 2012-09-09 ENCOUNTER — Ambulatory Visit (INDEPENDENT_AMBULATORY_CARE_PROVIDER_SITE_OTHER): Payer: Medicare Other | Admitting: *Deleted

## 2012-09-09 ENCOUNTER — Encounter: Payer: Self-pay | Admitting: *Deleted

## 2012-09-09 ENCOUNTER — Other Ambulatory Visit: Payer: Self-pay | Admitting: Internal Medicine

## 2012-09-09 DIAGNOSIS — I459 Conduction disorder, unspecified: Secondary | ICD-10-CM

## 2012-09-09 LAB — PACEMAKER DEVICE OBSERVATION
AL IMPEDENCE PM: 500 Ohm
ATRIAL PACING PM: 65
DEVICE MODEL PM: 2706003591

## 2012-09-09 NOTE — Progress Notes (Signed)
Pacemaker check in clinic for battery. Normal device function. Battery longevity < 0.5 years. ROV in 2 mths w/device clinic.

## 2012-09-20 ENCOUNTER — Encounter: Payer: Self-pay | Admitting: Internal Medicine

## 2012-11-02 ENCOUNTER — Other Ambulatory Visit: Payer: Self-pay | Admitting: Internal Medicine

## 2012-11-11 ENCOUNTER — Ambulatory Visit: Payer: Medicare Other | Admitting: *Deleted

## 2012-11-11 DIAGNOSIS — Z45010 Encounter for checking and testing of cardiac pacemaker pulse generator [battery]: Secondary | ICD-10-CM

## 2012-11-11 LAB — PACEMAKER DEVICE OBSERVATION

## 2012-11-11 NOTE — Progress Notes (Signed)
Pt seen in device clinic for battery check only.  Battery longevity < 0.5 year.  Pt. Will f/u with Nehemiah Settle 12/12/12.

## 2012-12-10 ENCOUNTER — Encounter: Payer: Self-pay | Admitting: Cardiology

## 2012-12-10 ENCOUNTER — Encounter: Payer: Self-pay | Admitting: Internal Medicine

## 2012-12-10 ENCOUNTER — Non-Acute Institutional Stay: Payer: Medicare Other | Admitting: Internal Medicine

## 2012-12-10 ENCOUNTER — Ambulatory Visit (INDEPENDENT_AMBULATORY_CARE_PROVIDER_SITE_OTHER): Payer: Medicare Other | Admitting: Cardiology

## 2012-12-10 VITALS — BP 138/62 | HR 62 | Ht 68.0 in | Wt 151.0 lb

## 2012-12-10 VITALS — BP 145/62 | HR 60 | Ht 68.0 in | Wt 151.8 lb

## 2012-12-10 DIAGNOSIS — Z950 Presence of cardiac pacemaker: Secondary | ICD-10-CM

## 2012-12-10 DIAGNOSIS — I10 Essential (primary) hypertension: Secondary | ICD-10-CM

## 2012-12-10 DIAGNOSIS — Z45018 Encounter for adjustment and management of other part of cardiac pacemaker: Secondary | ICD-10-CM

## 2012-12-10 DIAGNOSIS — E119 Type 2 diabetes mellitus without complications: Secondary | ICD-10-CM

## 2012-12-10 DIAGNOSIS — I442 Atrioventricular block, complete: Secondary | ICD-10-CM

## 2012-12-10 DIAGNOSIS — N182 Chronic kidney disease, stage 2 (mild): Secondary | ICD-10-CM | POA: Insufficient documentation

## 2012-12-10 DIAGNOSIS — Z45010 Encounter for checking and testing of cardiac pacemaker pulse generator [battery]: Secondary | ICD-10-CM

## 2012-12-10 HISTORY — DX: Chronic kidney disease, stage 2 (mild): N18.2

## 2012-12-10 LAB — PACEMAKER DEVICE OBSERVATION: DEVICE MODEL PM: 2706003591

## 2012-12-10 NOTE — Patient Instructions (Addendum)
NO CHANGES WERE MADE TODAY  YOU ARE SCHEDULED TO FOLLOW UP WITH BROOK EDMISTEN,PA-C  01/14/13 @10 :00 AM

## 2012-12-10 NOTE — Progress Notes (Signed)
  Subjective: MR on and daily for and a is a and, and learn more     Patient ID: Eugene Long, male    DOB: Apr 03, 1918, 79 y.o.   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  HYPERTENSION: controlled  PACEMAKER-[employer]:Pacemaker has <0.5 year life span. He anticipates generator replacement will be needed soon.  DIABETES MELLITUS, TYPE II: controlled  Chronic kidney disease, stage II (mild): stable  Fitting and adjustment of cardiac pacemaker: short life span f generator per cardiology notes    Review of Systems General; alert. Cooperative. No distress. Dermatological ROS: positive for removal of  lesion on the back by cryotherapy by Dr. Margo Aye, March 2014 Ophthalmic ROS: History of enucleation OS for malignant melanoma. Macular degeneration OD. ENT ROS: negative Allergy and Immunology ROS: negative Hematological and Lymphatic ROS: negative Endocrine ROS: positive for - diabetes Breast ROS: negative for breast lumps Respiratory ROS: no cough, shortness of breath, or wheezing Cardiovascular ROS: no chest pain or dyspnea on exertion. Pacemaker. Gastrointestinal ROS: no abdominal pain, change in bowel habits, or black or bloody stools Genito-Urinary ROS: no dysuria, trouble voiding, or hematuria Musculoskeletal ROS: Chronic knee discomfort. It does not disturb walking. Gait: stable Neurological ROS: no TIA or stroke symptoms Psychological ROS: negative     Objective:BP 138/62  Pulse 62  Ht 5\' 8"  (1.727 m)  Wt 151 lb (68.493 kg)  BMI 22.96 kg/m2    Physical Exam  Constitutional: He is oriented to person, place, and time. He appears well-developed and well-nourished.  HENT:   Head: Normocephalic and atraumatic.   Mouth/Throat: No oropharyngeal exudate.  Loss of hearing. Aid in right ear.  Eyes:  Artificial left eye. Rx lenses.  Neck: Neck supple. No JVD present. No tracheal deviation present. No thyromegaly present.  Cardiovascular: Normal rate,  regular rhythm, normal heart sounds and intact distal pulses.   pacemakeer  Pulmonary/Chest: Effort normal and breath sounds normal. He has no wheezes. He has no rales.  Abdominal: Bowel sounds are normal. He exhibits no distension and no mass. There is no tenderness.  Musculoskeletal: Normal range of motion. He exhibits no edema and no tenderness.  Lymphadenopathy:    He has no cervical adenopathy.  Neurological: He is alert and oriented to person, place, and time. He displays normal reflexes. No cranial nerve deficit. He exhibits normal muscle tone. Coordination normal.  Skin: Skin is warm and dry.  Psychiatric: He has a normal mood and affect. His behavior is normal. Judgment and thought content normal   LAB REVIEW 12/02/2012 BMP normal except creatinine 1.40. BUN 23, glucose 83  A1 C. 5.7    Assessment & Plan:  HYPERTENSION; continue current medicatio  DIABETES MELLITUS, TYPE II: controlled  Chronic kidney disease, stage II (mild): stable  Fitting and adjustment of cardiac pacemaker: generator replacement soon

## 2012-12-10 NOTE — Progress Notes (Signed)
PPM battery check in device clinic. Battery nearing ERI. Longevity <0.5 years. Return to clinic in one month.

## 2012-12-10 NOTE — Patient Instructions (Signed)
Continue current medication.

## 2012-12-12 ENCOUNTER — Encounter: Payer: Medicare Other | Admitting: Cardiology

## 2012-12-20 ENCOUNTER — Encounter: Payer: Self-pay | Admitting: Internal Medicine

## 2013-01-14 ENCOUNTER — Ambulatory Visit (INDEPENDENT_AMBULATORY_CARE_PROVIDER_SITE_OTHER): Payer: Medicare Other | Admitting: Cardiology

## 2013-01-14 ENCOUNTER — Encounter: Payer: Self-pay | Admitting: Cardiology

## 2013-01-14 VITALS — BP 120/64 | HR 62 | Ht 68.0 in | Wt 152.0 lb

## 2013-01-14 DIAGNOSIS — Z950 Presence of cardiac pacemaker: Secondary | ICD-10-CM

## 2013-01-14 DIAGNOSIS — Z45010 Encounter for checking and testing of cardiac pacemaker pulse generator [battery]: Secondary | ICD-10-CM

## 2013-01-14 DIAGNOSIS — Z45018 Encounter for adjustment and management of other part of cardiac pacemaker: Secondary | ICD-10-CM

## 2013-01-14 DIAGNOSIS — I442 Atrioventricular block, complete: Secondary | ICD-10-CM

## 2013-01-14 LAB — PACEMAKER DEVICE OBSERVATION
DEVICE MODEL PM: 2706003591
RV LEAD IMPEDENCE PM: 500 Ohm

## 2013-01-14 NOTE — Progress Notes (Signed)
PPM battery check in device clinic. Battery nearing ERI. Longevity <0.5 years. Return to clinic in one month. 

## 2013-01-14 NOTE — Patient Instructions (Addendum)
Your physician recommends that you schedule a follow-up appointment in: 1 months Office Visit with Rick Duff  Your physician recommends that you continue on your current medications as directed. Please refer to the Current Medication list given to you today.

## 2013-01-18 ENCOUNTER — Other Ambulatory Visit: Payer: Self-pay | Admitting: Internal Medicine

## 2013-02-04 ENCOUNTER — Encounter: Payer: Self-pay | Admitting: Internal Medicine

## 2013-02-11 ENCOUNTER — Encounter: Payer: Self-pay | Admitting: Internal Medicine

## 2013-02-11 ENCOUNTER — Ambulatory Visit (INDEPENDENT_AMBULATORY_CARE_PROVIDER_SITE_OTHER): Payer: Self-pay | Admitting: Cardiology

## 2013-02-11 DIAGNOSIS — Z45018 Encounter for adjustment and management of other part of cardiac pacemaker: Secondary | ICD-10-CM

## 2013-02-11 DIAGNOSIS — Z45010 Encounter for checking and testing of cardiac pacemaker pulse generator [battery]: Secondary | ICD-10-CM

## 2013-02-11 DIAGNOSIS — Z950 Presence of cardiac pacemaker: Secondary | ICD-10-CM

## 2013-02-11 NOTE — Progress Notes (Signed)
PPM check in device clinic for battery. Battery voltage 2.70. Longevity <0.5 years. Aging since Oct 2013. Nearing ERI. Continue monthly PPM checks for battery.

## 2013-02-15 ENCOUNTER — Other Ambulatory Visit: Payer: Self-pay | Admitting: Internal Medicine

## 2013-03-14 ENCOUNTER — Ambulatory Visit (INDEPENDENT_AMBULATORY_CARE_PROVIDER_SITE_OTHER): Payer: Self-pay | Admitting: Cardiology

## 2013-03-14 ENCOUNTER — Encounter: Payer: Self-pay | Admitting: Cardiology

## 2013-03-14 ENCOUNTER — Encounter: Payer: Self-pay | Admitting: Internal Medicine

## 2013-03-14 VITALS — BP 118/60 | HR 60 | Ht 68.0 in | Wt 153.1 lb

## 2013-03-14 DIAGNOSIS — I442 Atrioventricular block, complete: Secondary | ICD-10-CM

## 2013-03-14 DIAGNOSIS — Z45010 Encounter for checking and testing of cardiac pacemaker pulse generator [battery]: Secondary | ICD-10-CM

## 2013-03-14 DIAGNOSIS — Z950 Presence of cardiac pacemaker: Secondary | ICD-10-CM

## 2013-03-14 DIAGNOSIS — Z45018 Encounter for adjustment and management of other part of cardiac pacemaker: Secondary | ICD-10-CM

## 2013-03-14 LAB — PACEMAKER DEVICE OBSERVATION: DEVICE MODEL PM: 2706003591

## 2013-03-14 NOTE — Patient Instructions (Signed)
Your physician recommends that you schedule a follow-up appointment in: 1 MONTHS WITH Rick Duff 04-22-2013  Your physician recommends that you continue on your current medications as directed. Please refer to the Current Medication list given to you today.

## 2013-03-14 NOTE — Progress Notes (Signed)
Battery check only.  Battery longevity < 0.5 year.  Confirmed with Leta Jungling and [employer] at [facility]. ROV w/ Farren Nelles 04/22/2013 @ 10:00 AM for battery check.

## 2013-04-19 ENCOUNTER — Other Ambulatory Visit: Payer: Self-pay | Admitting: Internal Medicine

## 2013-04-22 ENCOUNTER — Ambulatory Visit (INDEPENDENT_AMBULATORY_CARE_PROVIDER_SITE_OTHER): Payer: Self-pay | Admitting: Cardiology

## 2013-04-22 ENCOUNTER — Encounter: Payer: Self-pay | Admitting: Cardiology

## 2013-04-22 VITALS — BP 110/58 | HR 60 | Ht 68.0 in | Wt 154.8 lb

## 2013-04-22 DIAGNOSIS — Z950 Presence of cardiac pacemaker: Secondary | ICD-10-CM

## 2013-04-22 DIAGNOSIS — Z45010 Encounter for checking and testing of cardiac pacemaker pulse generator [battery]: Secondary | ICD-10-CM

## 2013-04-22 DIAGNOSIS — I442 Atrioventricular block, complete: Secondary | ICD-10-CM

## 2013-04-22 DIAGNOSIS — Z45018 Encounter for adjustment and management of other part of cardiac pacemaker: Secondary | ICD-10-CM

## 2013-04-22 NOTE — Patient Instructions (Signed)
Your physician recommends that you schedule a follow-up appointment in: January 2015 WITH BROOKE EDMISTEN  Your physician recommends that you continue on your current medications as directed. Please refer to the Current Medication list given to you today.

## 2013-04-24 NOTE — Progress Notes (Signed)
Battery check only.   Battery longevity < 0.5 year.   Confirmed with [employer] at [employer]. ROV w/ Connar Keating in 4-6 weeks.

## 2013-04-25 LAB — MDC_IDC_ENUM_SESS_TYPE_INCLINIC
Implantable Pulse Generator Serial Number: 2706003591
Lead Channel Setting Pacing Amplitude: 3 V
Lead Channel Setting Pacing Pulse Width: 0.6 ms

## 2013-05-19 ENCOUNTER — Encounter: Payer: Self-pay | Admitting: Internal Medicine

## 2013-05-26 LAB — BASIC METABOLIC PANEL
BUN: 25 mg/dL — AB (ref 4–21)
Creatinine: 1.5 mg/dL — AB (ref 0.6–1.3)
GLUCOSE: 94 mg/dL
Potassium: 4.6 mmol/L (ref 3.4–5.3)
Sodium: 140 mmol/L (ref 137–147)

## 2013-05-26 LAB — LIPID PANEL
CHOLESTEROL: 136 mg/dL (ref 0–200)
HDL: 45 mg/dL (ref 35–70)
LDL CALC: 67 mg/dL
LDl/HDL Ratio: 3
Triglycerides: 121 mg/dL (ref 40–160)

## 2013-05-26 LAB — HEPATIC FUNCTION PANEL
ALT: 9 U/L — AB (ref 10–40)
AST: 15 U/L (ref 14–40)
Alkaline Phosphatase: 55 U/L (ref 25–125)
BILIRUBIN, TOTAL: 0.6 mg/dL

## 2013-05-26 LAB — HEMOGLOBIN A1C: Hgb A1c MFr Bld: 6.1 % — AB (ref 4.0–6.0)

## 2013-05-27 ENCOUNTER — Non-Acute Institutional Stay: Payer: Medicare Other | Admitting: Internal Medicine

## 2013-05-27 ENCOUNTER — Encounter: Payer: Self-pay | Admitting: Internal Medicine

## 2013-05-27 ENCOUNTER — Telehealth: Payer: Self-pay

## 2013-05-27 ENCOUNTER — Encounter: Payer: Medicare Other | Admitting: Cardiology

## 2013-05-27 VITALS — BP 132/58 | HR 68 | Ht 67.0 in | Wt 157.0 lb

## 2013-05-27 DIAGNOSIS — I428 Other cardiomyopathies: Secondary | ICD-10-CM

## 2013-05-27 DIAGNOSIS — C439 Malignant melanoma of skin, unspecified: Secondary | ICD-10-CM

## 2013-05-27 DIAGNOSIS — K219 Gastro-esophageal reflux disease without esophagitis: Secondary | ICD-10-CM

## 2013-05-27 DIAGNOSIS — I739 Peripheral vascular disease, unspecified: Secondary | ICD-10-CM

## 2013-05-27 DIAGNOSIS — M545 Low back pain, unspecified: Secondary | ICD-10-CM

## 2013-05-27 DIAGNOSIS — I10 Essential (primary) hypertension: Secondary | ICD-10-CM

## 2013-05-27 DIAGNOSIS — N508 Other specified disorders of male genital organs: Secondary | ICD-10-CM

## 2013-05-27 DIAGNOSIS — Z950 Presence of cardiac pacemaker: Secondary | ICD-10-CM

## 2013-05-27 DIAGNOSIS — M720 Palmar fascial fibromatosis [Dupuytren]: Secondary | ICD-10-CM

## 2013-05-27 DIAGNOSIS — Z87898 Personal history of other specified conditions: Secondary | ICD-10-CM

## 2013-05-27 DIAGNOSIS — N182 Chronic kidney disease, stage 2 (mild): Secondary | ICD-10-CM

## 2013-05-27 DIAGNOSIS — E119 Type 2 diabetes mellitus without complications: Secondary | ICD-10-CM

## 2013-05-27 MED ORDER — GLIMEPIRIDE 1 MG PO TABS: ORAL_TABLET | ORAL | Status: DC

## 2013-05-27 MED ORDER — FINASTERIDE 5 MG PO TABS: ORAL_TABLET | ORAL | Status: DC

## 2013-05-27 MED ORDER — TAMSULOSIN HCL 0.4 MG PO CAPS: ORAL_CAPSULE | ORAL | Status: DC

## 2013-05-27 NOTE — Progress Notes (Signed)
Patient ID: Eugene Long, male   DOB: 01-30-1918, 80 y.o.   MRN: [id number]    Nursing Home Location:  [place] of Service: Clinic (12)  PCP: Estill Dooms, MD  Code Status: LIVING WILL   Allergies  Allergen Reactions  . Codeine   . Glyburide   . Uroxatral [Alfuzosin]     Chief Complaint  Patient presents with  . Medical Managment of Chronic Issues    Comprehensive exam: blood pressure, blood sugar, CAD, cholesterol, depression    HPI:  HYPERTENSION: controlled  PERIPHERAL VASCULAR DISEASE: stable. No increase in pain.  Non-ischemic cardiomyopathy: stable  GERD: stable  DIABETES MELLITUS, TYPE II : stable  Contracture of palmar fascia: right 4th finger palmar tendon. History of surgery on the left hand 4th and 5th palmar tendons.  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  Other specified disorder of male genital organs(608.89): right epididymal cyst  Lumbago: chronic low back pain. Seeing Dr. Marrion Coy, chiropractor  Dupuytren's contracture: right 4th.      Past Medical History  Diagnosis Date  . Benign neoplasm of colon     Polyp  . Depressive disorder, not elsewhere classified   . Osteoarthrosis, unspecified whether generalized or localized, unspecified site   . Complete rupture of rotator cuff   . Peripheral vascular disease, unspecified   . Other specified forms of chronic ischemic heart disease   . Acute venous embolism and thrombosis of unspecified deep vessels of lower extremity   . Esophageal reflux   . Conduction disorder, unspecified   . Other and unspecified hyperlipidemia   . Diabetes mellitus   . BPH (benign  prostatic hypertrophy)   . Diverticulosis     colon, Hx of  . Presence of permanent cardiac pacemaker   . Hypertension   . 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 Anxiety   . Unspecified hearing loss   . Coronary atherosclerosis of native coronary artery   . Atrioventricular block, complete   . Stricture and stenosis of esophagus   . Diaphragmatic hernia without mention of obstruction or gangrene   . Diverticulosis of colon (without mention of hemorrhage)   . Unspecified constipation   . Hypertrophy of prostate with urinary obstruction and other lower urinary tract symptoms (LUTS)   . Other specified disorder of male genital organs(608.89)     right epididymal cyst  . Unspecified pruritic disorder   . Unspecified hypertrophic and atrophic condition of skin   . Actinic keratosis   . Pain in joint, site unspecified   . Lumbago   . Contracture of palmar fascia   . Insomnia, unspecified   . Shortness of breath   . Dysphagia, unspecified(787.20)   . Nonspecific abnormal results of pulmonary system function study   . Nonspecific abnormal results of liver function study   . Chronic kidney disease, stage II (mild) 12/10/2012    Past Surgical History  Procedure Laterality Date  . Knee arthroscopy Left 1993  . Appendectomy  1942  . Insert / replace / remove pacemaker  2005    Dr. Crissie Sickles  . Tonsillectomy  1931  . Arthoscopy right knee  1980  . Carotid endarterectomy  1987    right  . Cataract extraction  [id number]  . 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 Colonoscopy    . Colonoscopy  2005    Dr. Otis Peak Neurology: [facility] Cardiology: Crissie Sickles Orthopedics: Rodell Perna GI: Verl Blalock Ophthalmology: Bing Plume Ophthalmology: Rosendo Gros Ophthalmology:Grevin Ophthalmology: Kathrin Penner Urology: Jeffie Pollock   PAST PROCEDURES 1986 heart catheterization 1995 EGD([facility]): Esophageal stenosis and dilation of  esophagus 1995 colonoscopy([facility]): Removal adenomatous polyp 1997 stress test normal 1999 heart catheterization(Dr. Lia Foyer) 2000 colonoscopy([facility]): Removal adenomatous polyp 2005 colonoscopy([facility]): Normal 2006 heart catheterization(Dr. Lia Foyer) 2006 MRI brain, chest, abdomen(Dr. Lovena Le) 07/08/09 CT chest: 3 mm noncalcified nodule in the right middle lobe, with an additional 3 mm noncalcified nodule in the left upper lung 07/08/09 chest x-ray: No change in hyperaeration. Permanent pacemaker. 10/05/09 carotid duplex: 40-59% stenosis in bilateral internal carotid arteries 09/30/10 carotid duplex: Unchanged  Social History: History   Social History  . Marital Status: Married    Spouse Name: N/A    Number of Children: N/A  . Years of Education: N/A   Occupational History  . retired Teacher, English as a foreign language    Social History Main Topics  . Smoking status: Former Smoker -- 0.50 packs/day for 17 years    Types: Cigarettes    Quit date: 05/22/1960  . Smokeless tobacco: Never Used  . Alcohol Use: No  . Drug Use: No  . Sexual Activity: No   Other Topics Concern  . None   Social History Narrative   Patient lives at [facility] since 2003.   Widowed   Living will   In [place]   Pacemaker   Previously worked in Research officer, trade union    Family History Family Status  Relation Status Death Age  . Mother Deceased 77    peritonitis  . Father Deceased 60    melanoma  . Sister Deceased 85    heart failure  . Daughter Alive   . Son Alive   . Brother Deceased     heart failure, vascular disease  . Sister Alive   . Sister Alive    Family History  Problem Relation Age of Onset  . Cancer Father   . Heart disease Sister     CHF     Medications: Patient's Medications  New Prescriptions   No medications on file  Previous Medications   AMLODIPINE (NORVASC) 2.5 MG TABLET    TAKE 1 TABLET DAILY TO CONTROL BLOOD PRESSURE   ASPIRIN 81 MG TABLET    Take 81 mg  by mouth daily.   CHOLECALCIFEROL (VITAMIN D) 1000 UNITS TABLET    Take 1,000 Units by mouth daily.   EMOLLIENT (VASELINE INTENSIVE CARE EX)    Apply 1 application topically as needed.   FINASTERIDE (PROSCAR) 5 MG TABLET    TAKE 1 TABLET DAILY FOR PROSTATE   GLIMEPIRIDE (AMARYL) 1 MG TABLET    Take 1 mg by mouth daily before breakfast.   IBUPROFEN (ADVIL,MOTRIN) 200 MG TABLET    Take 200 mg by mouth every 6 (six) hours as needed.   METOPROLOL TARTRATE (LOPRESSOR) 25 MG TABLET    Take 25 mg by mouth 2 (two) times daily.   MULTIPLE VITAMINS-MINERALS (ICAPS PO)    Take by mouth daily.   PRAVASTATIN (PRAVACHOL) 40 MG TABLET    TAKE 1 TABLET DAILY FOR CONTROL CHOLESTEROL   SERTRALINE (ZOLOFT) 50 MG TABLET    TAKE  1 TABLET DAILY FOR DEPRESSION AND NERVES  Modified Medications   No medications on file  Discontinued Medications   No medications on file    Immunization History  Administered Date(s) Administered  . Influenza Whole 02/20/2012, 02/20/2013  . Pneumococcal Polysaccharide-23 05/22/2005  . Td 05/22/1997     Review of Systems  Constitutional: Negative for fever, chills, diaphoresis, activity change, appetite change, fatigue and unexpected weight change.  HENT: Positive for hearing loss. Negative for congestion and ear pain.        Significant loss of taste ability  Eyes: Negative for pain.       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  Respiratory: Negative for apnea, cough, choking, chest tightness and shortness of breath.   Cardiovascular: Negative for chest pain, palpitations and leg swelling.  Gastrointestinal: Positive for constipation.       Flatulence. History hiatal hernia and diverticulosis. History colon polyps  Genitourinary:       Increased frequency of urination. Nocturia x3. Slow, weak urinary stream. Urinary hesitation. History of BPH.  Musculoskeletal: Negative.   Neurological: Negative for tremors, seizures,  syncope, speech difficulty, numbness and headaches.       History TIA  Hematological: Negative.   Psychiatric/Behavioral: Negative.       Filed Vitals:   05/27/13 1016  BP: 132/58  Pulse: 68  Height: 5\' 7"  (1.702 m)  Weight: 157 lb (71.215 kg)   Physical Exam  Constitutional: He is oriented to person, place, and time. He appears well-developed and well-nourished. No distress.  HENT:  Right Ear: External ear normal.  Left Ear: External ear normal.  Nose: Nose normal.  Mouth/Throat: Oropharynx is clear and moist.  Severe hearing loss in both ears  Eyes:  Prosthetic left eye  Neck: No JVD present. No tracheal deviation present. No thyromegaly present.  Cardiovascular: Normal rate, regular rhythm, normal heart sounds and intact distal pulses.  Exam reveals no gallop and no friction rub.   No murmur heard. Respiratory: No respiratory distress. He has no wheezes. He has no rales. He exhibits no tenderness.  GI: He exhibits mass. He exhibits no distension. There is no tenderness.  Genitourinary: Rectum normal, prostate normal and penis normal. Guaiac negative stool. No penile tenderness.  Epididymal cyst in the right scrotum.  Musculoskeletal: Normal range of motion. He exhibits no edema.  Dupuytren's contracture right fourth metacarpal area. Crepitance in the left knee.  Lymphadenopathy:    He has no cervical adenopathy.  Neurological: He is alert and oriented to person, place, and time. He has normal reflexes. No cranial nerve deficit. Coordination normal.  01/05/09 MMSE 29/30. Passed clock drawing.       Labs reviewed: Nursing Home on 05/27/2013  Component Date Value Ref Range Status  . Glucose 05/26/2013 94   Final  . BUN 05/26/2013 25* 4 - 21 mg/dL Final  . Creatinine 05/26/2013 1.5* 0.6 - 1.3 mg/dL Final  . Potassium 05/26/2013 4.6  3.4 - 5.3 mmol/L Final  . Sodium 05/26/2013 140  137 - 147 mmol/L Final  . LDl/HDL Ratio 05/26/2013 3.0   Final  . Triglycerides  05/26/2013 121  40 - 160 mg/dL Final  . Cholesterol 05/26/2013 136  0 - 200 mg/dL Final  . HDL 05/26/2013 45  35 - 70 mg/dL Final  . LDL Cholesterol 05/26/2013 67   Final  . Alkaline Phosphatase 05/26/2013 55  25 - 125 U/L Final  . ALT 05/26/2013 9* 10 - 40  U/L Final  . AST 05/26/2013 15  14 - 40 U/L Final  . Bilirubin, Total 05/26/2013 0.6   Final  . Hemoglobin A1C 05/26/2013 6.1* 4.0 - 6.0 % Final  Office Visit on 04/22/2013  Component Date Value Ref Range Status  . Pulse Generator Manufacturer 04/25/2013 [employer]   Preliminary  . Pulse Gen Model 04/25/2013 [id number]   Preliminary  . Pulse Gen Serial Number 04/25/2013 [id number]   Preliminary  . RA Pace Amplitude 04/25/2013 2   Preliminary  . RV Pace PulseWidth 04/25/2013 0.6   Preliminary  . RV Pace Amplitude 04/25/2013 3   Preliminary  . Miscellaneous Comment 04/25/2013 Battery check only. Battery aging since Oct 2013. Battery longevity < 0.5 year. Confirmed with [employer] at [employer]. ROV w/ Jerene Pitch 06/03/2012 for battery check.   Preliminary  Office Visit on 03/14/2013  Component Date Value Ref Range Status  . DEVICE MODEL PM 03/14/2013 [id number]   Final-Edited  . PACEART TECH NOTES PM 03/14/2013 Battery check only. Battery aging since Oct 2013. Battery longevity < 0.5 year. Confirmed with Tomi Bamberger and [employer] at [employer]. ROV w/ Brooke 04/22/2013 @ 10:00 AM for battery check.   Final-Edited     Assessment/Plan 1. PERIPHERAL VASCULAR DISEASE Stable  2. Non-ischemic cardiomyopathy Stable  3. HYPERTENSION Controlled  4. GERD Asymptomatic  5. DIABETES MELLITUS, TYPE II Controlled  6. Contracture of palmar fascia Unchanged Dupuytren's contracture  7. BENIGN PROSTATIC HYPERTROPHY, HX OF Unchanged

## 2013-05-27 NOTE — Telephone Encounter (Signed)
Received Microalbumin 0.50 done 05/26/13 [facility], results were not in this morning when we saw the patient. Dr. Nyoka Cowden signed the report, call patient test normal. Called Mr Bentz with report.

## 2013-06-03 ENCOUNTER — Ambulatory Visit (INDEPENDENT_AMBULATORY_CARE_PROVIDER_SITE_OTHER): Payer: Medicare Other | Admitting: Cardiology

## 2013-06-03 ENCOUNTER — Encounter: Payer: Self-pay | Admitting: Cardiology

## 2013-06-03 ENCOUNTER — Encounter: Payer: Self-pay | Admitting: Internal Medicine

## 2013-06-03 VITALS — BP 136/58 | HR 60 | Ht 67.0 in | Wt 156.0 lb

## 2013-06-03 DIAGNOSIS — I442 Atrioventricular block, complete: Secondary | ICD-10-CM

## 2013-06-03 DIAGNOSIS — Z45010 Encounter for checking and testing of cardiac pacemaker pulse generator [battery]: Secondary | ICD-10-CM

## 2013-06-03 DIAGNOSIS — Z950 Presence of cardiac pacemaker: Secondary | ICD-10-CM

## 2013-06-03 DIAGNOSIS — Z45018 Encounter for adjustment and management of other part of cardiac pacemaker: Secondary | ICD-10-CM

## 2013-06-03 NOTE — Patient Instructions (Addendum)
Your physician recommends that you keep you follow-up appointment: 07/01/13 at 9:30 am with Murlean Hark  Your physician recommends that you continue on your current medications as directed. Please refer to the Current Medication list given to you today.

## 2013-06-04 NOTE — Progress Notes (Signed)
[employer] PPM. Battery check only.    Battery longevity < 0.5 year. Aging since Oct 2013. Nearing ERI.  Confirmed with [employer] at [employer]. Discussed with Dr. Lovena Le. ROV w/ Cherrill Scrima in 4-6 weeks.

## 2013-06-10 ENCOUNTER — Other Ambulatory Visit: Payer: Self-pay | Admitting: Internal Medicine

## 2013-06-10 LAB — MDC_IDC_ENUM_SESS_TYPE_INCLINIC: Implantable Pulse Generator Serial Number: 2706003591

## 2013-06-11 ENCOUNTER — Encounter: Payer: Self-pay | Admitting: Internal Medicine

## 2013-06-21 ENCOUNTER — Encounter: Payer: Self-pay | Admitting: Internal Medicine

## 2013-06-21 DIAGNOSIS — M720 Palmar fascial fibromatosis [Dupuytren]: Secondary | ICD-10-CM | POA: Insufficient documentation

## 2013-06-21 DIAGNOSIS — M545 Low back pain, unspecified: Secondary | ICD-10-CM | POA: Insufficient documentation

## 2013-06-21 DIAGNOSIS — Z950 Presence of cardiac pacemaker: Secondary | ICD-10-CM | POA: Insufficient documentation

## 2013-06-21 DIAGNOSIS — N508 Other specified disorders of male genital organs: Secondary | ICD-10-CM | POA: Insufficient documentation

## 2013-06-21 DIAGNOSIS — C439 Malignant melanoma of skin, unspecified: Secondary | ICD-10-CM | POA: Insufficient documentation

## 2013-06-23 ENCOUNTER — Encounter: Payer: Self-pay | Admitting: Internal Medicine

## 2013-07-01 ENCOUNTER — Encounter: Payer: Self-pay | Admitting: Cardiology

## 2013-07-01 ENCOUNTER — Ambulatory Visit (INDEPENDENT_AMBULATORY_CARE_PROVIDER_SITE_OTHER): Payer: Medicare Other | Admitting: Cardiology

## 2013-07-01 VITALS — BP 130/50 | HR 60 | Ht 67.0 in | Wt 154.0 lb

## 2013-07-01 DIAGNOSIS — Z45010 Encounter for checking and testing of cardiac pacemaker pulse generator [battery]: Secondary | ICD-10-CM

## 2013-07-01 DIAGNOSIS — Z950 Presence of cardiac pacemaker: Secondary | ICD-10-CM

## 2013-07-01 DIAGNOSIS — Z45018 Encounter for adjustment and management of other part of cardiac pacemaker: Secondary | ICD-10-CM

## 2013-07-01 DIAGNOSIS — R002 Palpitations: Secondary | ICD-10-CM

## 2013-07-01 DIAGNOSIS — I442 Atrioventricular block, complete: Secondary | ICD-10-CM

## 2013-07-01 DIAGNOSIS — I10 Essential (primary) hypertension: Secondary | ICD-10-CM

## 2013-07-01 LAB — MDC_IDC_ENUM_SESS_TYPE_INCLINIC
Brady Statistic RA Percent Paced: 69 %
Brady Statistic RV Percent Paced: 100 %
Lead Channel Setting Pacing Amplitude: 2 V
MDC IDC PG SERIAL: 2706003591
MDC IDC SET LEADCHNL RV PACING AMPLITUDE: 3 V
MDC IDC SET LEADCHNL RV PACING PULSEWIDTH: 0.6 ms
MDC IDC SET LEADCHNL RV SENSING SENSITIVITY: 2 mV

## 2013-07-01 NOTE — Progress Notes (Signed)
[employer] PPM. Battery check only.     Battery longevity < 0.5 year. Aging since Oct 2013. Nearing ERI.  Previously confirmed with [employer] at [employer] and discussed with Dr. Lovena Le. ROV w/ [name] in 4-6 weeks.

## 2013-07-01 NOTE — Patient Instructions (Addendum)
Your physician recommends that you keep your schedule  follow-up appointment 07/29/13 at 9:30 with Ileene Hutchinson    Your physician recommends that you continue on your current medications as directed. Please refer to the Current Medication list given to you today.

## 2013-07-15 ENCOUNTER — Encounter: Payer: Medicare Other | Admitting: Internal Medicine

## 2013-07-26 ENCOUNTER — Other Ambulatory Visit: Payer: Self-pay | Admitting: Internal Medicine

## 2013-07-28 ENCOUNTER — Encounter: Payer: Self-pay | Admitting: Internal Medicine

## 2013-07-29 ENCOUNTER — Encounter: Payer: Self-pay | Admitting: Cardiology

## 2013-07-29 ENCOUNTER — Other Ambulatory Visit (HOSPITAL_COMMUNITY): Payer: Medicare Other

## 2013-07-29 ENCOUNTER — Encounter: Payer: Self-pay | Admitting: *Deleted

## 2013-07-29 ENCOUNTER — Ambulatory Visit (INDEPENDENT_AMBULATORY_CARE_PROVIDER_SITE_OTHER): Payer: Medicare Other | Admitting: Cardiology

## 2013-07-29 VITALS — BP 120/70 | HR 60 | Wt 159.0 lb

## 2013-07-29 DIAGNOSIS — Z45010 Encounter for checking and testing of cardiac pacemaker pulse generator [battery]: Secondary | ICD-10-CM

## 2013-07-29 DIAGNOSIS — Z45018 Encounter for adjustment and management of other part of cardiac pacemaker: Secondary | ICD-10-CM

## 2013-07-29 DIAGNOSIS — I459 Conduction disorder, unspecified: Secondary | ICD-10-CM

## 2013-07-29 DIAGNOSIS — I442 Atrioventricular block, complete: Secondary | ICD-10-CM

## 2013-07-29 DIAGNOSIS — Z950 Presence of cardiac pacemaker: Secondary | ICD-10-CM

## 2013-07-29 LAB — CBC WITH DIFFERENTIAL/PLATELET
BASOS ABS: 0 10*3/uL (ref 0.0–0.1)
Basophils Relative: 0.5 % (ref 0.0–3.0)
EOS ABS: 0.3 10*3/uL (ref 0.0–0.7)
Eosinophils Relative: 3.7 % (ref 0.0–5.0)
HEMATOCRIT: 37.8 % — AB (ref 39.0–52.0)
Hemoglobin: 12.6 g/dL — ABNORMAL LOW (ref 13.0–17.0)
LYMPHS ABS: 1.3 10*3/uL (ref 0.7–4.0)
Lymphocytes Relative: 17.2 % (ref 12.0–46.0)
MCHC: 33.5 g/dL (ref 30.0–36.0)
MCV: 93 fl (ref 78.0–100.0)
MONO ABS: 0.5 10*3/uL (ref 0.1–1.0)
Monocytes Relative: 7.1 % (ref 3.0–12.0)
NEUTROS PCT: 71.5 % (ref 43.0–77.0)
Neutro Abs: 5.4 10*3/uL (ref 1.4–7.7)
PLATELETS: 228 10*3/uL (ref 150.0–400.0)
RBC: 4.07 Mil/uL — ABNORMAL LOW (ref 4.22–5.81)
RDW: 12.8 % (ref 11.5–14.6)
WBC: 7.6 10*3/uL (ref 4.5–10.5)

## 2013-07-29 LAB — BASIC METABOLIC PANEL
BUN: 28 mg/dL — ABNORMAL HIGH (ref 6–23)
CALCIUM: 8.9 mg/dL (ref 8.4–10.5)
CO2: 27 mEq/L (ref 19–32)
Chloride: 106 mEq/L (ref 96–112)
Creatinine, Ser: 1.6 mg/dL — ABNORMAL HIGH (ref 0.4–1.5)
GFR: 44.31 mL/min — ABNORMAL LOW (ref >60.00)
Glucose, Bld: 207 mg/dL — ABNORMAL HIGH (ref 70–99)
POTASSIUM: 4.4 meq/L (ref 3.5–5.1)
SODIUM: 138 meq/L (ref 135–145)

## 2013-07-29 LAB — PROTIME-INR
INR: 1.3 ratio — ABNORMAL HIGH (ref 0.8–1.0)
PROTHROMBIN TIME: 13.2 s — AB (ref 10.2–12.4)

## 2013-07-29 NOTE — Patient Instructions (Addendum)
Your Physician recommends you to have a Generator Change 08/04/13 AT 7:00AM PLEASE ARRIVE AT 5:30 AM  Your physician recommends that you have lab work today: [facility] has requested that you have an echocardiogram BEFORE 08/04/13  . Echocardiography is a painless test that uses sound waves to create images of your heart. It provides your doctor with information about the size and shape of your heart and how well your heart's chambers and valves are working. This procedure takes approximately one hour. There are no restrictions for this procedure.

## 2013-07-30 ENCOUNTER — Ambulatory Visit (HOSPITAL_COMMUNITY): Payer: Medicare Other | Attending: Cardiovascular Disease | Admitting: Cardiology

## 2013-07-30 ENCOUNTER — Encounter: Payer: Self-pay | Admitting: Cardiovascular Disease

## 2013-07-30 ENCOUNTER — Encounter: Payer: Self-pay | Admitting: Cardiology

## 2013-07-30 DIAGNOSIS — I428 Other cardiomyopathies: Secondary | ICD-10-CM | POA: Insufficient documentation

## 2013-07-30 DIAGNOSIS — Z01810 Encounter for preprocedural cardiovascular examination: Secondary | ICD-10-CM

## 2013-07-30 DIAGNOSIS — I459 Conduction disorder, unspecified: Secondary | ICD-10-CM

## 2013-07-30 DIAGNOSIS — Z950 Presence of cardiac pacemaker: Secondary | ICD-10-CM | POA: Insufficient documentation

## 2013-07-30 DIAGNOSIS — I2510 Atherosclerotic heart disease of native coronary artery without angina pectoris: Secondary | ICD-10-CM

## 2013-07-30 NOTE — Progress Notes (Signed)
ELECTROPHYSIOLOGY OFFICE NOTE  Patient ID: Eugene Long MRN: [id number], DOB/AGE: March 10, 1918   Date of Visit: 07/30/2013  Primary Physician: Jeanmarie Hubert, MD Primary EP: Cristopher Peru, MD Reason for Visit: EP/device follow-up  History of Present Illness Eugene Long is a 80 y.o. male with complete heart block s/p PPM implant 2005, nonobstructive CAD s/p cath several years ago, HTN and DM who presents today for routine electrophysiology followup. He is accompanied by his daughter. His PPM battery has reached ERI.  Since last being seen in our clinic, he reports DOE and mild LE swelling over the last 10 days. These symptoms are new for him. He denies CP. He denies SOB at rest. He denies palpitations, dizziness, near syncope or syncope. He denies orthopnea or PND. He is compliant with medications.  Past Medical History Past Medical History  Diagnosis Date  . Benign neoplasm of colon     Polyp  . Depressive disorder, not elsewhere classified   . Osteoarthrosis, unspecified whether generalized or localized, unspecified site   . Complete rupture of rotator cuff   . Peripheral vascular disease, unspecified   . Other specified forms of chronic ischemic heart disease   . Acute venous embolism and thrombosis of unspecified deep vessels of lower extremity   . Esophageal reflux   . Conduction disorder, unspecified   . Other and unspecified hyperlipidemia   . Diabetes mellitus   . BPH (benign prostatic hypertrophy)   . Diverticulosis     colon, Hx of  . Presence of permanent cardiac pacemaker   . Hypertension   . 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 Anxiety   . Unspecified hearing loss   . Coronary atherosclerosis of native coronary artery   . Atrioventricular block, complete   . Stricture and stenosis of esophagus   . Diaphragmatic hernia without mention of obstruction or gangrene   . Diverticulosis of colon (without mention of hemorrhage)   .  Unspecified constipation   . Hypertrophy of prostate with urinary obstruction and other lower urinary tract symptoms (LUTS)   . Other specified disorder of male genital organs(608.89)     right epididymal cyst  . Unspecified pruritic disorder   . Unspecified hypertrophic and atrophic condition of skin   . Actinic keratosis   . Pain in joint, site unspecified   . Lumbago   . Contracture of palmar fascia   . Insomnia, unspecified   . Shortness of breath   . Dysphagia, unspecified(787.20)   . Nonspecific abnormal results of pulmonary system function study   . Nonspecific abnormal results of liver function study   . Chronic kidney disease, stage II (mild) 12/10/2012  . Heart block   . Pacemaker battery depletion     Past Surgical History Past Surgical History  Procedure Laterality Date  . Knee arthroscopy Left 1993  . Appendectomy  1942  . Insert / replace / remove pacemaker  2005    Dr. Crissie Sickles  . Tonsillectomy  1931  . Arthoscopy right knee  1980  . Carotid endarterectomy  1987    right  . Cataract extraction  [id number]  . 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 Colonoscopy    . Colonoscopy  2005    Dr. Sharlett Iles    Allergies/Intolerances Allergies  Allergen Reactions  . Codeine   . Glyburide   . Uroxatral [Alfuzosin]     Current Home Medications Current Outpatient  Prescriptions  Medication Sig Dispense Refill  . amLODipine (NORVASC) 2.5 MG tablet TAKE 1 TABLET DAILY TO CONTROL BLOOD PRESSURE  90 tablet  3  . aspirin 81 MG tablet Take 81 mg by mouth daily.      . cholecalciferol (VITAMIN D) 1000 UNITS tablet Take 1,000 Units by mouth daily.      . Emollient (VASELINE INTENSIVE CARE EX) Apply 1 application topically as needed.      . finasteride (PROSCAR) 5 MG tablet One daily to treat enlarged prostate  90 tablet  3  . glimepiride (AMARYL) 1 MG tablet One daily at breakfast to treat diabetes  90 tablet  3  . ibuprofen (ADVIL,MOTRIN) 200 MG  tablet Take 200 mg by mouth every 6 (six) hours as needed.      . metoprolol tartrate (LOPRESSOR) 25 MG tablet TAKE ONE AND ONE-HALF TABLETS (37.5 MG TOTAL) TWICE A DAY (INCREASE IN DOSE)  270 tablet  0  . Multiple Vitamins-Minerals (ICAPS PO) Take by mouth daily.      . pravastatin (PRAVACHOL) 40 MG tablet TAKE 1 TABLET DAILY FOR CONTROL CHOLESTEROL  90 tablet  3  . sertraline (ZOLOFT) 50 MG tablet TAKE 1 TABLET DAILY FOR DEPRESSION AND NERVES  90 tablet  1  . tamsulosin (FLOMAX) 0.4 MG CAPS capsule One daily to treat enlarged prostate  90 capsule  3   No current facility-administered medications for this visit.    Social History History   Social History  . Marital Status: Married    Spouse Name: N/A    Number of Children: N/A  . Years of Education: N/A   Occupational History  . retired Teacher, English as a foreign language    Social History Main Topics  . Smoking status: Former Smoker -- 0.50 packs/day for 17 years    Types: Cigarettes    Quit date: 05/22/1960  . Smokeless tobacco: Never Used  . Alcohol Use: No  . Drug Use: No  . Sexual Activity: No   Other Topics Concern  . Not on file   Social History Narrative   Patient lives at [facility] since 2003.   Widowed   Living will   In [place]   Pacemaker   Previously worked in tool and [place]: No chills, fever, night sweats or weight changes Cardiovascular: No chest pain, dyspnea on exertion, edema, orthopnea, palpitations, paroxysmal nocturnal dyspnea Dermatological: No rash, lesions or masses Respiratory: No cough, dyspnea Urologic: No hematuria, dysuria Abdominal: No nausea, vomiting, diarrhea, bright red blood per rectum, melena, or hematemesis Neurologic: No visual changes, weakness, changes in mental status All other systems reviewed and are otherwise negative except as noted above.  Physical Exam Vitals: Blood pressure 120/70, pulse 60, weight 159 lb (72.122 kg).  General:  Well developed, well appearing 80 y.o. male in no acute distress. HEENT: Normocephalic, atraumatic. EOMs intact. Sclera nonicteric. Oropharynx clear.  Neck: Supple. No JVD. Lungs: Respirations regular and unlabored, CTA bilaterally. No wheezes, rales or rhonchi. Heart: RRR. S1, S2 present. No murmurs, rub, S3 or S4. Abdomen: Soft, non-distended. Extremities: No clubbing, cyanosis or edema. PT/Radials 2+ and equal bilaterally. Psych: Normal affect. Neuro: Alert and oriented X 3. Moves all extremities spontaneously.   Diagnostics Most recent echo 2008 LEFT VENTRICLE: - Left ventricular size was normal. - Overall left ventricular systolic function was normal. - Left ventricular ejection fraction was estimated to be 60 %. - There were no left ventricular regional wall motion  abnormalities. - Left ventricular wall thickness was normal. AORTIC VALVE: - Aortic valve thickness was mildly increased. - There was lower normal aortic valve leaflet excursion. AORTA: - The aortic root was normal in size. MITRAL VALVE: - The mitral valve was grossly normal. Doppler interpretation(s): - There was mild mitral valvular regurgitation. LEFT ATRIUM: - Left atrial size was normal. RIGHT VENTRICLE: - Right ventricular size was normal. - Right ventricular systolic function was normal. PULMONIC VALVE: - The pulmonic valve was not well visualized. TRICUSPID VALVE: - The tricuspid valve was grossly normal. Doppler interpretation(s): - There was mild tricuspid valvular regurgitation. RIGHT ATRIUM: - Right atrial size was normal. PERICARDIUM: - There was no pericardial effusion. --------------------------------------------------------------- SUMMARY - Overall left ventricular systolic function was normal. Left ventricular ejection fraction was estimated to be 60 %. There were no left ventricular regional wall motion abnormalities. - Aortic valve thickness was mildly increased. There was  lower normal aortic valve leaflet excursion. - There was mild mitral valvular regurgitation.  Device interrogation today - Battery at ERI since 07/17/2013. Reprogrammed mode to DDD to restore AV synchrony.   Assessment and Plan 1. Complete heart block s/p PPM implant 2005, now with PPM battery at [facility] - battery at [facility] since 07/17/2013   - Mr. Lafavor is not tolerating loss of AV synchrony so reprogrammed mode to DDD - discussed need for PPM generator change  - risks, benefits and alternatives to PPM generator change were discussed in detail; these risks include, but are not limited to, lead dislodgement, bleeding and infection; Mr. Swigart and his daughter expressed verbal understanding and agree to proceed; this has been scheduled with Dr. Lovena Le on Monday, March 16.  - as he is V paced 100%, will update echo to assess LVEF prior to [facility] generator change  Signed, Savian Mazon, PA-C 07/30/2013, 12:02 AM

## 2013-07-30 NOTE — Progress Notes (Signed)
Echo performed. 

## 2013-07-31 ENCOUNTER — Encounter: Payer: Self-pay | Admitting: Internal Medicine

## 2013-08-01 ENCOUNTER — Telehealth: Payer: Self-pay | Admitting: *Deleted

## 2013-08-01 NOTE — Telephone Encounter (Signed)
Called to inform patient of schedule change Monday. Pt moved back to 7:30/9:30 at hospital for procedure.  Reviewed instructions to arrive at hospital at 7:30am for procedure at 9:30am. Patient verbalized understanding and agreeable to plan.

## 2013-08-03 MED ORDER — SODIUM CHLORIDE 0.9 % IR SOLN
80.0000 mg | Status: DC
  Filled 2013-08-03: qty 2

## 2013-08-03 MED ORDER — CEFAZOLIN SODIUM-DEXTROSE 2-3 GM-% IV SOLR
2.0000 g | INTRAVENOUS | Status: DC
  Filled 2013-08-03: qty 50

## 2013-08-04 ENCOUNTER — Encounter (HOSPITAL_COMMUNITY): Payer: Self-pay | Admitting: *Deleted

## 2013-08-04 ENCOUNTER — Encounter (HOSPITAL_COMMUNITY): Payer: Self-pay | Admitting: Pharmacy Technician

## 2013-08-04 ENCOUNTER — Encounter (HOSPITAL_COMMUNITY)
Admission: RE | Disposition: A | Discharge status: Home / Self Care | Payer: Self-pay | Source: Ambulatory Visit | Attending: Internal Medicine

## 2013-08-04 ENCOUNTER — Ambulatory Visit (HOSPITAL_COMMUNITY)
Admission: RE | Admit: 2013-08-04 | Discharge: 2013-08-04 | Disposition: A | Discharge status: Home / Self Care | Payer: Medicare Other | Source: Ambulatory Visit | Attending: Internal Medicine | Admitting: Internal Medicine

## 2013-08-04 DIAGNOSIS — N508 Other specified disorders of male genital organs: Secondary | ICD-10-CM | POA: Insufficient documentation

## 2013-08-04 DIAGNOSIS — G4700 Insomnia, unspecified: Secondary | ICD-10-CM | POA: Insufficient documentation

## 2013-08-04 DIAGNOSIS — I259 Chronic ischemic heart disease, unspecified: Secondary | ICD-10-CM | POA: Insufficient documentation

## 2013-08-04 DIAGNOSIS — I442 Atrioventricular block, complete: Secondary | ICD-10-CM

## 2013-08-04 DIAGNOSIS — Z860100 Personal history of colon polyps, unspecified: Secondary | ICD-10-CM | POA: Insufficient documentation

## 2013-08-04 DIAGNOSIS — F3289 Other specified depressive episodes: Secondary | ICD-10-CM | POA: Insufficient documentation

## 2013-08-04 DIAGNOSIS — H9190 Unspecified hearing loss, unspecified ear: Secondary | ICD-10-CM | POA: Insufficient documentation

## 2013-08-04 DIAGNOSIS — K222 Esophageal obstruction: Secondary | ICD-10-CM | POA: Insufficient documentation

## 2013-08-04 DIAGNOSIS — F411 Generalized anxiety disorder: Secondary | ICD-10-CM | POA: Insufficient documentation

## 2013-08-04 DIAGNOSIS — L570 Actinic keratosis: Secondary | ICD-10-CM | POA: Insufficient documentation

## 2013-08-04 DIAGNOSIS — E119 Type 2 diabetes mellitus without complications: Secondary | ICD-10-CM | POA: Insufficient documentation

## 2013-08-04 DIAGNOSIS — E785 Hyperlipidemia, unspecified: Secondary | ICD-10-CM | POA: Insufficient documentation

## 2013-08-04 DIAGNOSIS — K219 Gastro-esophageal reflux disease without esophagitis: Secondary | ICD-10-CM | POA: Insufficient documentation

## 2013-08-04 DIAGNOSIS — I2510 Atherosclerotic heart disease of native coronary artery without angina pectoris: Secondary | ICD-10-CM | POA: Insufficient documentation

## 2013-08-04 DIAGNOSIS — K5900 Constipation, unspecified: Secondary | ICD-10-CM | POA: Insufficient documentation

## 2013-08-04 DIAGNOSIS — N401 Enlarged prostate with lower urinary tract symptoms: Secondary | ICD-10-CM | POA: Insufficient documentation

## 2013-08-04 DIAGNOSIS — K449 Diaphragmatic hernia without obstruction or gangrene: Secondary | ICD-10-CM | POA: Insufficient documentation

## 2013-08-04 DIAGNOSIS — K5730 Diverticulosis of large intestine without perforation or abscess without bleeding: Secondary | ICD-10-CM | POA: Insufficient documentation

## 2013-08-04 DIAGNOSIS — N138 Other obstructive and reflux uropathy: Secondary | ICD-10-CM | POA: Insufficient documentation

## 2013-08-04 DIAGNOSIS — N182 Chronic kidney disease, stage 2 (mild): Secondary | ICD-10-CM | POA: Insufficient documentation

## 2013-08-04 DIAGNOSIS — F329 Major depressive disorder, single episode, unspecified: Secondary | ICD-10-CM | POA: Insufficient documentation

## 2013-08-04 DIAGNOSIS — Z45018 Encounter for adjustment and management of other part of cardiac pacemaker: Secondary | ICD-10-CM | POA: Insufficient documentation

## 2013-08-04 DIAGNOSIS — Z86718 Personal history of other venous thrombosis and embolism: Secondary | ICD-10-CM | POA: Insufficient documentation

## 2013-08-04 DIAGNOSIS — Z87891 Personal history of nicotine dependence: Secondary | ICD-10-CM | POA: Insufficient documentation

## 2013-08-04 DIAGNOSIS — I129 Hypertensive chronic kidney disease with stage 1 through stage 4 chronic kidney disease, or unspecified chronic kidney disease: Secondary | ICD-10-CM | POA: Insufficient documentation

## 2013-08-04 DIAGNOSIS — I739 Peripheral vascular disease, unspecified: Secondary | ICD-10-CM | POA: Insufficient documentation

## 2013-08-04 DIAGNOSIS — Z85820 Personal history of malignant melanoma of skin: Secondary | ICD-10-CM | POA: Insufficient documentation

## 2013-08-04 DIAGNOSIS — N139 Obstructive and reflux uropathy, unspecified: Secondary | ICD-10-CM | POA: Insufficient documentation

## 2013-08-04 HISTORY — PX: PERMANENT PACEMAKER GENERATOR CHANGE: SHX6022

## 2013-08-04 HISTORY — PX: PACEMAKER GENERATOR CHANGE: SHX5998

## 2013-08-04 LAB — MDC_IDC_ENUM_SESS_TYPE_INCLINIC: Implantable Pulse Generator Serial Number: 2706003591

## 2013-08-04 LAB — GLUCOSE, CAPILLARY
GLUCOSE-CAPILLARY: 88 mg/dL (ref 70–99)
Glucose-Capillary: 96 mg/dL (ref 70–99)

## 2013-08-04 LAB — SURGICAL PCR SCREEN
MRSA, PCR: NEGATIVE
STAPHYLOCOCCUS AUREUS: NEGATIVE

## 2013-08-04 SURGERY — PERMANENT PACEMAKER GENERATOR CHANGE
Anesthesia: LOCAL

## 2013-08-04 MED ORDER — LIDOCAINE HCL (PF) 1 % IJ SOLN
INTRAMUSCULAR | Status: AC
  Filled 2013-08-04: qty 60

## 2013-08-04 MED ORDER — MUPIROCIN 2 % EX OINT
TOPICAL_OINTMENT | CUTANEOUS | Status: AC
  Administered 2013-08-04: 1 via NASAL
  Filled 2013-08-04: qty 22

## 2013-08-04 MED ORDER — SODIUM CHLORIDE 0.9 % IV SOLN
INTRAVENOUS | Status: DC
  Administered 2013-08-04: 08:00:00 via INTRAVENOUS

## 2013-08-04 MED ORDER — ONDANSETRON HCL 4 MG/2ML IJ SOLN: 4.0000 mg | Freq: Four times a day (QID) | INTRAMUSCULAR | Status: DC | PRN

## 2013-08-04 MED ORDER — ACETAMINOPHEN 325 MG PO TABS
325.0000 mg | ORAL_TABLET | ORAL | Status: DC | PRN
  Filled 2013-08-04: qty 2

## 2013-08-04 MED ORDER — CHLORHEXIDINE GLUCONATE 4 % EX LIQD
60.0000 mL | Freq: Once | CUTANEOUS | Status: DC
  Filled 2013-08-04: qty 60

## 2013-08-04 MED ORDER — MUPIROCIN 2 % EX OINT
TOPICAL_OINTMENT | Freq: Two times a day (BID) | CUTANEOUS | Status: DC
Start: 2013-08-04 — End: 2013-08-04
  Administered 2013-08-04: 1 via NASAL
  Filled 2013-08-04: qty 22

## 2013-08-04 NOTE — H&P (View-Only) (Signed)
ELECTROPHYSIOLOGY OFFICE NOTE  Patient ID: Eugene Long MRN: [id number], DOB/AGE: March 10, 1918   Date of Visit: 07/30/2013  Primary Physician: Jeanmarie Hubert, MD Primary EP: Cristopher Peru, MD Reason for Visit: EP/device follow-up  History of Present Illness Eugene Long is a 80 y.o. male with complete heart block s/p PPM implant 2005, nonobstructive CAD s/p cath several years ago, HTN and DM who presents today for routine electrophysiology followup. He is accompanied by his daughter. His PPM battery has reached ERI.  Since last being seen in our clinic, he reports DOE and mild LE swelling over the last 10 days. These symptoms are new for him. He denies CP. He denies SOB at rest. He denies palpitations, dizziness, near syncope or syncope. He denies orthopnea or PND. He is compliant with medications.  Past Medical History Past Medical History  Diagnosis Date  . Benign neoplasm of colon     Polyp  . Depressive disorder, not elsewhere classified   . Osteoarthrosis, unspecified whether generalized or localized, unspecified site   . Complete rupture of rotator cuff   . Peripheral vascular disease, unspecified   . Other specified forms of chronic ischemic heart disease   . Acute venous embolism and thrombosis of unspecified deep vessels of lower extremity   . Esophageal reflux   . Conduction disorder, unspecified   . Other and unspecified hyperlipidemia   . Diabetes mellitus   . BPH (benign prostatic hypertrophy)   . Diverticulosis     colon, Hx of  . Presence of permanent cardiac pacemaker   . Hypertension   . 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 Anxiety   . Unspecified hearing loss   . Coronary atherosclerosis of native coronary artery   . Atrioventricular block, complete   . Stricture and stenosis of esophagus   . Diaphragmatic hernia without mention of obstruction or gangrene   . Diverticulosis of colon (without mention of hemorrhage)   .  Unspecified constipation   . Hypertrophy of prostate with urinary obstruction and other lower urinary tract symptoms (LUTS)   . Other specified disorder of male genital organs(608.89)     right epididymal cyst  . Unspecified pruritic disorder   . Unspecified hypertrophic and atrophic condition of skin   . Actinic keratosis   . Pain in joint, site unspecified   . Lumbago   . Contracture of palmar fascia   . Insomnia, unspecified   . Shortness of breath   . Dysphagia, unspecified(787.20)   . Nonspecific abnormal results of pulmonary system function study   . Nonspecific abnormal results of liver function study   . Chronic kidney disease, stage II (mild) 12/10/2012  . Heart block   . Pacemaker battery depletion     Past Surgical History Past Surgical History  Procedure Laterality Date  . Knee arthroscopy Left 1993  . Appendectomy  1942  . Insert / replace / remove pacemaker  2005    Dr. Crissie Sickles  . Tonsillectomy  1931  . Arthoscopy right knee  1980  . Carotid endarterectomy  1987    right  . Cataract extraction  [id number]  . 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 Colonoscopy    . Colonoscopy  2005    Dr. Sharlett Iles    Allergies/Intolerances Allergies  Allergen Reactions  . Codeine   . Glyburide   . Uroxatral [Alfuzosin]     Current Home Medications Current Outpatient  Prescriptions  Medication Sig Dispense Refill  . amLODipine (NORVASC) 2.5 MG tablet TAKE 1 TABLET DAILY TO CONTROL BLOOD PRESSURE  90 tablet  3  . aspirin 81 MG tablet Take 81 mg by mouth daily.      . cholecalciferol (VITAMIN D) 1000 UNITS tablet Take 1,000 Units by mouth daily.      . Emollient (VASELINE INTENSIVE CARE EX) Apply 1 application topically as needed.      . finasteride (PROSCAR) 5 MG tablet One daily to treat enlarged prostate  90 tablet  3  . glimepiride (AMARYL) 1 MG tablet One daily at breakfast to treat diabetes  90 tablet  3  . ibuprofen (ADVIL,MOTRIN) 200 MG  tablet Take 200 mg by mouth every 6 (six) hours as needed.      . metoprolol tartrate (LOPRESSOR) 25 MG tablet TAKE ONE AND ONE-HALF TABLETS (37.5 MG TOTAL) TWICE A DAY (INCREASE IN DOSE)  270 tablet  0  . Multiple Vitamins-Minerals (ICAPS PO) Take by mouth daily.      . pravastatin (PRAVACHOL) 40 MG tablet TAKE 1 TABLET DAILY FOR CONTROL CHOLESTEROL  90 tablet  3  . sertraline (ZOLOFT) 50 MG tablet TAKE 1 TABLET DAILY FOR DEPRESSION AND NERVES  90 tablet  1  . tamsulosin (FLOMAX) 0.4 MG CAPS capsule One daily to treat enlarged prostate  90 capsule  3   No current facility-administered medications for this visit.    Social History History   Social History  . Marital Status: Married    Spouse Name: N/A    Number of Children: N/A  . Years of Education: N/A   Occupational History  . retired Teacher, English as a foreign language    Social History Main Topics  . Smoking status: Former Smoker -- 0.50 packs/day for 17 years    Types: Cigarettes    Quit date: 05/22/1960  . Smokeless tobacco: Never Used  . Alcohol Use: No  . Drug Use: No  . Sexual Activity: No   Other Topics Concern  . Not on file   Social History Narrative   Patient lives at [facility] since 2003.   Widowed   Living will   [place]   Pacemaker   Previously worked in tool and [place]: No chills, fever, night sweats or weight changes Cardiovascular: No chest pain, dyspnea on exertion, edema, orthopnea, palpitations, paroxysmal nocturnal dyspnea Dermatological: No rash, lesions or masses Respiratory: No cough, dyspnea Urologic: No hematuria, dysuria Abdominal: No nausea, vomiting, diarrhea, bright red blood per rectum, melena, or hematemesis Neurologic: No visual changes, weakness, changes in mental status All other systems reviewed and are otherwise negative except as noted above.  Physical Exam Vitals: Blood pressure 120/70, pulse 60, weight 159 lb (72.122 kg).  General:  Well developed, well appearing 80 y.o. male in no acute distress. HEENT: Normocephalic, atraumatic. EOMs intact. Sclera nonicteric. Oropharynx clear.  Neck: Supple. No JVD. Lungs: Respirations regular and unlabored, CTA bilaterally. No wheezes, rales or rhonchi. Heart: RRR. S1, S2 present. No murmurs, rub, S3 or S4. Abdomen: Soft, non-distended. Extremities: No clubbing, cyanosis or edema. PT/Radials 2+ and equal bilaterally. Psych: Normal affect. Neuro: Alert and oriented X 3. Moves all extremities spontaneously.   Diagnostics Most recent echo 2008 LEFT VENTRICLE: - Left ventricular size was normal. - Overall left ventricular systolic function was normal. - Left ventricular ejection fraction was estimated to be 60 %. - There were no left ventricular regional wall motion  abnormalities. - Left ventricular wall thickness was normal. AORTIC VALVE: - Aortic valve thickness was mildly increased. - There was lower normal aortic valve leaflet excursion. AORTA: - The aortic root was normal in size. MITRAL VALVE: - The mitral valve was grossly normal. Doppler interpretation(s): - There was mild mitral valvular regurgitation. LEFT ATRIUM: - Left atrial size was normal. RIGHT VENTRICLE: - Right ventricular size was normal. - Right ventricular systolic function was normal. PULMONIC VALVE: - The pulmonic valve was not well visualized. TRICUSPID VALVE: - The tricuspid valve was grossly normal. Doppler interpretation(s): - There was mild tricuspid valvular regurgitation. RIGHT ATRIUM: - Right atrial size was normal. PERICARDIUM: - There was no pericardial effusion. --------------------------------------------------------------- SUMMARY - Overall left ventricular systolic function was normal. Left ventricular ejection fraction was estimated to be 60 %. There were no left ventricular regional wall motion abnormalities. - Aortic valve thickness was mildly increased. There was  lower normal aortic valve leaflet excursion. - There was mild mitral valvular regurgitation.  Device interrogation today - Battery at ERI since 07/17/2013. Reprogrammed mode to DDD to restore AV synchrony.   Assessment and Plan 1. Complete heart block s/p PPM implant 2005, now with PPM battery at [facility] - battery at [facility] since 07/17/2013   - Eugene Long is not tolerating loss of AV synchrony so reprogrammed mode to DDD - discussed need for PPM generator change  - risks, benefits and alternatives to PPM generator change were discussed in detail; these risks include, but are not limited to, lead dislodgement, bleeding and infection; Eugene Long and his daughter expressed verbal understanding and agree to proceed; this has been scheduled with Dr. Lovena Le on Monday, March 16.  - as he is V paced 100%, will update echo to assess LVEF prior to [facility] generator change  Signed, Nickalos Petersen, PA-C 07/30/2013, 12:02 AM

## 2013-08-04 NOTE — CV Procedure (Signed)
Electrophysiology procedure note  Procedure: Removal of a previously implanted dual-chamber pacemaker which had reached elective replacement and insertion of a new dual-chamber pacemaker  Indication: Complete heart block, with previous device at elective replacement  Description of procedure: After informed consent was obtained, the patient was taken to the diagnostic electrophysiology laboratory in the fasting state. After the usual preparation and draping, 30 cc of lidocaine was infiltrated into the left infraclavicular region. A 5 cm incision was carried out over this region and electrocautery was utilized to dissect down to the pacemaker pocket. The pacemaker generator was removed. The atrial lead was evaluated, and found to be working satisfactorily. P waves were 3 mV. The threshold was less than 0.6 V at 0.5 ms. The pacing impedance was 400 ohms. At this point the right ventricular lead was disconnected from the can and evaluated. The threshold was 1.2 V at 0.5 ms. The pacing impedance was 430 ohms. There were no R waves secondary to complete heart block. The new Medtronic Adapta L., dual-chamber pacemaker was connected to the old atrial and ventricular pacing leads in place back in the subcutaneous pocket. The device did not fit. The pacemaker pocket was revised, expanding the pocket to accommodate the new device. The pocket was again irrigated, and the incision was closed with 2 layers of Vicryl suture. Benzoin and Steri-Strips for pain on the scan. A pressure dressing was applied. The patient was returned to the recovery area in satisfactory condition.   Complications: There were no immediate procedure complications  Conclusion: Successful removal of a previous implanted dual-chamber pacemaker, followed by pacemaker pocket revision, followed by insertion of a new dual-chamber pacemaker, in a patient with complete heart block.  Cristopher Peru, M.D.

## 2013-08-04 NOTE — Interval H&P Note (Signed)
History and Physical Interval Note:  08/04/2013 10:01 AM  Eugene Long  has presented today for surgery, with the diagnosis of Battery depletion  The various methods of treatment have been discussed with the patient and family. After consideration of risks, benefits and other options for treatment, the patient has consented to  Procedure(s): PERMANENT PACEMAKER GENERATOR CHANGE (N/A) as a surgical intervention .  The patient's history has been reviewed, patient examined, no change in status, stable for surgery.  I have reviewed the patient's chart and labs.  Questions were answered to the patient's satisfaction.     Mikle Bosworth.D.

## 2013-08-04 NOTE — Discharge Instructions (Signed)
Pacemaker Battery Change, Care After  °Refer to this sheet in the next few weeks. These instructions provide you with information on caring for yourself after your procedure. Your health care provider may also give you more specific instructions. Your treatment has been planned according to current medical practices, but problems sometimes occur. Call your health care provider if you have any problems or questions after your procedure. °WHAT TO EXPECT AFTER THE PROCEDURE °After your procedure, it is typical to have the following sensations: °· Soreness at the pacemaker site. °HOME CARE INSTRUCTIONS  °· Keep the incision clean and dry. °· Unless advised otherwise, you may shower beginning 48 hours after your procedure. °· For the first week after the replacement, avoid stretching motions that pull at the incision site and avoid heavy exercise with the arm on the same side as the incision. °· Only take over-the-counter or prescription medicines for pain, discomfort, or fever as directed by your health care provider. °· Your health care provider will tell you when you will need to next test your pacemaker by telephone or when to return to the office for follow up for removal of stitches. °SEEK MEDICAL CARE IF:  °· You have pain at the incision site that is not relieved by over-the-counter or prescription medicine. °· There is drainage or pus from the incision site. °· There is swelling larger than a lime at the incision site. °· You develop red streaking that extends above or below the incision site. °· You feel brief, intermittent palpitations, lightheadedness, or any symptoms that you feel might be related to your heart. °SEEK IMMEDIATE MEDICAL CARE IF:  °· You experience chest pain that is different than the pain at the pacemaker site. °· Shortness of breath. °· Palpitations or irregular heart beat. °· Lightheadedness that does not go away quickly. °· Fainting. °· You have pain that gets worse and is not relieved by  medicine. °MAKE SURE YOU:  °· Understand these instructions. °· Will watch your condition. °· Will get help right away if you are not doing well or get worse. °Document Released: 02/26/2013 Document Reviewed: 11/20/2012 °[employer]® Patient Information ©2014 [employer]. ° °

## 2013-08-14 ENCOUNTER — Ambulatory Visit (INDEPENDENT_AMBULATORY_CARE_PROVIDER_SITE_OTHER): Payer: Medicare Other | Admitting: *Deleted

## 2013-08-14 DIAGNOSIS — I459 Conduction disorder, unspecified: Secondary | ICD-10-CM

## 2013-08-14 LAB — MDC_IDC_ENUM_SESS_TYPE_INCLINIC
Battery Impedance: 100 Ohm
Battery Remaining Longevity: 110 mo
Battery Voltage: 2.79 V
Brady Statistic AP VP Percent: 83 %
Brady Statistic AP VS Percent: 0 %
Brady Statistic AS VP Percent: 17 %
Brady Statistic AS VS Percent: 0 %
Date Time Interrogation Session: 20150326101738
Lead Channel Impedance Value: 453 Ohm
Lead Channel Pacing Threshold Amplitude: 0.75 V
Lead Channel Pacing Threshold Pulse Width: 0.4 ms
Lead Channel Sensing Intrinsic Amplitude: 2.8 mV
Lead Channel Setting Pacing Amplitude: 2.5 V
Lead Channel Setting Pacing Pulse Width: 0.64 ms
MDC IDC MSMT LEADCHNL RV IMPEDANCE VALUE: 502 Ohm
MDC IDC MSMT LEADCHNL RV PACING THRESHOLD AMPLITUDE: 1 V
MDC IDC MSMT LEADCHNL RV PACING THRESHOLD PULSEWIDTH: 0.64 ms
MDC IDC SET LEADCHNL RA PACING AMPLITUDE: 1.5 V
MDC IDC SET LEADCHNL RV SENSING SENSITIVITY: 4 mV

## 2013-08-14 NOTE — Progress Notes (Signed)
Wound check appointment. Steri-strips removed. Wound without redness or edema. Incision edges approximated, wound well healed. Normal device function. Thresholds, sensing, and impedances consistent with implant measurements. Device programmed at 3.5V/auto capture programmed on for extra safety margin until 3 month visit. Histogram distribution appropriate for patient and level of activity. No mode switches or high ventricular rates noted. Rate response on today.  Patient educated about wound care, arm mobility, lifting restrictions. ROV in 3 months with implanting physician.

## 2013-09-11 ENCOUNTER — Encounter: Payer: Self-pay | Admitting: Internal Medicine

## 2013-09-22 ENCOUNTER — Encounter: Payer: Self-pay | Admitting: Internal Medicine

## 2013-09-22 DIAGNOSIS — R066 Hiccough: Secondary | ICD-10-CM | POA: Insufficient documentation

## 2013-09-23 ENCOUNTER — Non-Acute Institutional Stay: Payer: Medicare Other | Admitting: Internal Medicine

## 2013-09-23 ENCOUNTER — Encounter: Payer: Self-pay | Admitting: Internal Medicine

## 2013-09-23 VITALS — BP 138/62 | HR 64 | Wt 155.0 lb

## 2013-09-23 DIAGNOSIS — R079 Chest pain, unspecified: Secondary | ICD-10-CM

## 2013-09-23 DIAGNOSIS — R066 Hiccough: Secondary | ICD-10-CM

## 2013-09-23 DIAGNOSIS — K219 Gastro-esophageal reflux disease without esophagitis: Secondary | ICD-10-CM

## 2013-09-23 DIAGNOSIS — Z950 Presence of cardiac pacemaker: Secondary | ICD-10-CM

## 2013-09-23 DIAGNOSIS — I10 Essential (primary) hypertension: Secondary | ICD-10-CM

## 2013-09-23 NOTE — Progress Notes (Signed)
Patient ID: Eugene Long, male   DOB: May 13, 1918, 80 y.o.   MRN: [id number]    Location:  [facility]  Place of Service: CLINIC    Allergies  Allergen Reactions  . Codeine Other (See Comments)    Runny eyes and nose,  Teary eyes  . Glyburide Other (See Comments)    Unknown  . Uroxatral [Alfuzosin] Other (See Comments)    Heart races    Chief Complaint  Patient presents with  . hiccups    on Sunday 09/21/13, then coughed up a lot of phlegm. Monday went to Urgent Care because of congestion and pain top of chest when he would hiccup, had trouble swallowing. They started him on Omeprazole 40mg  and made appt with Dr. Hilarie Fredrickson 11/17/13. Today he feels find.    HPI:  Chest pain, unspecified: 09/21/13 in upper anterior chest at the time he was having hiccups. No diaphoresis. BP 178/74 per patient. Note from [facility] shows BP 117/61. Denies palpitations. Long history of esophageal irritation from GERD. Hx of stricture and dilation in the past by Dr. Sharlett Iles. Has appt. With Dr. Hilarie Fredrickson 11/17/13.  Hiccups: resolved  HYPERTENSION: normal today  GERD: had stopped omeprazole, but has resumed it now.  Presence of permanent cardiac pacemaker: generator changed by Dr. Beckie Salts on 08/04/13.    Medications: Patient's Medications  New Prescriptions   No medications on file  Previous Medications   AMLODIPINE (NORVASC) 2.5 MG TABLET    Take 2.5 mg by mouth daily.   ASPIRIN 81 MG TABLET    Take 81 mg by mouth daily.   CHOLECALCIFEROL (VITAMIN D) 1000 UNITS TABLET    Take 1,000 Units by mouth daily.   FINASTERIDE (PROSCAR) 5 MG TABLET    Take 5 mg by mouth daily. One daily to treat enlarged prostate   GLIMEPIRIDE (AMARYL) 1 MG TABLET    Take 1 mg by mouth daily. One daily at breakfast to treat diabetes   IBUPROFEN (ADVIL,MOTRIN) 200 MG TABLET    Take 200 mg by mouth daily as needed for mild pain.    METOPROLOL TARTRATE (LOPRESSOR) 25 MG TABLET    Take 37.5 mg by mouth 2 (two) times  daily.   MINERAL OIL LIQUID    Take by mouth daily as needed for moderate constipation (for eye).   MULTIPLE VITAMINS-MINERALS (ICAPS PO)    Take 1 capsule by mouth daily.    OMEPRAZOLE (PRILOSEC) 40 MG CAPSULE    Take 40 mg by mouth daily.   PRAVASTATIN (PRAVACHOL) 40 MG TABLET    Take 40 mg by mouth daily.   SERTRALINE (ZOLOFT) 50 MG TABLET    Take 50 mg by mouth daily.  Modified Medications   No medications on file  Discontinued Medications   No medications on file     Review of Systems  Constitutional: Negative for fever, chills, diaphoresis, activity change, appetite change, fatigue and unexpected weight change.  HENT: Positive for hearing loss. Negative for congestion and ear pain.        Significant loss of taste ability  Eyes: Negative for pain.       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  Respiratory: Negative for apnea, cough, choking, chest tightness and shortness of breath.   Cardiovascular: Positive for chest pain (episode on 09/22/13 has resolved). Negative for palpitations and leg swelling.  Gastrointestinal: Positive for constipation.       Flatulence. History hiatal hernia, GERD,  esoph stricture with dilation, and diverticulosis. History colon polyps  Genitourinary:       Increased frequency of urination. Nocturia x3. Slow, weak urinary stream. Urinary hesitation. History of BPH.  Musculoskeletal: Negative.   Neurological: Negative for tremors, seizures, syncope, speech difficulty, numbness and headaches.       History TIA  Hematological: Negative.   Psychiatric/Behavioral: Negative.     Filed Vitals:   09/23/13 0913  BP: 138/62  Pulse: 64  Weight: 155 lb (70.308 kg)  SpO2: 99%   Physical Exam  Constitutional: He is oriented to person, place, and time. He appears well-developed and well-nourished.  HENT:  Head: Normocephalic and atraumatic.  Mouth/Throat: No oropharyngeal exudate.  Loss of hearing. Aid in  right ear.  Eyes:  Artificial left eye. Rx lenses.  Neck: Neck supple. No JVD present. No tracheal deviation present. No thyromegaly present.  Cardiovascular: Normal rate, regular rhythm, normal heart sounds and intact distal pulses.   pacemaker  Pulmonary/Chest: Effort normal and breath sounds normal. He has no wheezes. He has no rales.  Abdominal: Bowel sounds are normal. He exhibits no distension and no mass. There is no tenderness.  Musculoskeletal: Normal range of motion. He exhibits no edema and no tenderness.  Lymphadenopathy:    He has no cervical adenopathy.  Neurological: He is alert and oriented to person, place, and time. He displays normal reflexes. No cranial nerve deficit. He exhibits normal muscle tone. Coordination normal.  Skin: Skin is warm and dry.  Psychiatric: He has a normal mood and affect. His behavior is normal. Judgment and thought content normal.     Labs reviewed: Clinical Support on 08/14/2013  Component Date Value Ref Range Status  . Date Time Interrogation Session 08/14/2013 [id number]   Final  . Pulse Generator Manufacturer 08/14/2013 [employer]   Final  . Pulse Gen Model 08/14/2013 ADDRL1 Adapta   Final  . Pulse Gen Serial Number 08/14/2013 [id number]   Final  . RV Sense Sensitivity 08/14/2013 4.00   Final  . RA Pace Amplitude 08/14/2013 1.500   Final  . RV Pace PulseWidth 08/14/2013 0.64   Final  . RV Pace Amplitude 08/14/2013 2.500   Final  . RA Impedance 08/14/2013 453   Final  . RA Amplitude 08/14/2013 2.80   Final  . RA Pacing Amplitude 08/14/2013 0.750   Final  . RA Pacing PulseWidth 08/14/2013 0.40   Final  . RV IMPEDANCE 08/14/2013 502   Final  . RV Pacing Amplitude 08/14/2013 1.000   Final  . RV Pacing PulseWidth 08/14/2013 0.64   Final  . Battery Status 08/14/2013 Unknown   Final  . Battery Longevity 08/14/2013 110   Final  . Battery Voltage 08/14/2013 2.79   Final  . Battery Impedance 08/14/2013 100   Final  . Brady AP VP Percent  08/14/2013 83   Final  . Brady AS VP Percent 08/14/2013 17   Final  . Huston Foley AP VS Percent 08/14/2013 0   Final  . Huston Foley AS VS Percent 08/14/2013 0   Final  . Eval Rhythm 08/14/2013 Heart block @ 30   Final  . Miscellaneous Comment 08/14/2013    Final                   Value:Wound check appointment. Steri-strips removed. Wound without redness or edema. Incision edges approximated, wound well healed. Normal device function. Thresholds, sensing, and impedances consistent with implant measurements. Device programmed at  3.5V/auto capture programmed on for extra safety margin until 3 month visit. Histogram distribution appropriate for patient and level of activity. No mode switches or high ventricular rates noted. Rate response on today.  Patient educated about wound                          care, arm mobility, lifting restrictions. ROV in 3 months with implanting physician.  Admission on 08/04/2013, Discharged on 08/04/2013  Component Date Value Ref Range Status  . Glucose-Capillary 08/04/2013 88  70 - 99 mg/dL Final  . MRSA, PCR 08/04/2013 NEGATIVE  NEGATIVE Final  . Staphylococcus aureus 08/04/2013 NEGATIVE  NEGATIVE Final   Comment:                                 The Xpert SA Assay (FDA                          approved for NASAL specimens                          in patients over 55 years of age),                          is one component of                          a comprehensive surveillance                          program.  Test performance has                          been validated by [employer] for patients greater                          than or equal to 74 year old.                          It is not intended                          to diagnose infection nor to                          guide or monitor treatment.  . Glucose-Capillary 08/04/2013 96  70 - 99 mg/dL Final  Office Visit on 07/29/2013  Component Date Value Ref  Range Status  . WBC 07/29/2013 7.6  4.5 - 10.5 K/uL Final  . RBC 07/29/2013 4.07* 4.22 - 5.81 Mil/uL Final  . Hemoglobin 07/29/2013 12.6* 13.0 - 17.0 g/dL Final  . HCT 07/29/2013 37.8* 39.0 - 52.0 % Final  . MCV 07/29/2013 93.0  78.0 - 100.0 fl Final  . MCHC 07/29/2013 33.5  30.0 - 36.0 g/dL Final  . RDW 07/29/2013 12.8  11.5 - 14.6 % Final  . Platelets 07/29/2013 228.0  150.0 - 400.0 K/uL Final  . Neutrophils Relative % 07/29/2013 71.5  43.0 - 77.0 % Final  . Lymphocytes Relative 07/29/2013 17.2  12.0 - 46.0 % Final  . Monocytes Relative 07/29/2013 7.1  3.0 - 12.0 % Final  . Eosinophils Relative 07/29/2013 3.7  0.0 - 5.0 % Final  . Basophils Relative 07/29/2013 0.5  0.0 - 3.0 % Final  . Neutro Abs 07/29/2013 5.4  1.4 - 7.7 K/uL Final  . Lymphs Abs 07/29/2013 1.3  0.7 - 4.0 K/uL Final  . Monocytes Absolute 07/29/2013 0.5  0.1 - 1.0 K/uL Final  . Eosinophils Absolute 07/29/2013 0.3  0.0 - 0.7 K/uL Final  . Basophils Absolute 07/29/2013 0.0  0.0 - 0.1 K/uL Final  . Sodium 07/29/2013 138  135 - 145 mEq/L Final  . Potassium 07/29/2013 4.4  3.5 - 5.1 mEq/L Final  . Chloride 07/29/2013 106  96 - 112 mEq/L Final  . CO2 07/29/2013 27  19 - 32 mEq/L Final  . Glucose, Bld 07/29/2013 207* 70 - 99 mg/dL Final  . BUN 07/29/2013 28* 6 - 23 mg/dL Final  . Creatinine, Ser 07/29/2013 1.6* 0.4 - 1.5 mg/dL Final  . Calcium 07/29/2013 8.9  8.4 - 10.5 mg/dL Final  . GFR 07/29/2013 44.31* >60.00 mL/min Final  . Prothrombin Time 07/29/2013 13.2* 10.2 - 12.4 sec Final  . INR 07/29/2013 1.3* 0.8 - 1.0 ratio Final  . Pulse Generator Manufacturer 08/04/2013 [employer]   Final  . Pulse Gen Model 08/04/2013 T60A1 T60DR   Final  . Pulse Gen Serial Number 08/04/2013 [id number]   Final  . Eval Rhythm 08/04/2013 V paced   Final  . Miscellaneous Comment 08/04/2013 Quick look for battery - Battery at [facility] since 07/17/2013. Reverted to VVI at 55 bpm. Reprogrammed to DDD at 60 bpm. Pacer dependent. Scheduled for PPM  generator change on Monday, March 16 with Dr. Lovena Le.   Final  Office Visit on 07/01/2013  Component Date Value Ref Range Status  . Pulse Generator Manufacturer 07/01/2013 [employer]iatron   Final  . Pulse Gen Model 07/01/2013 T60A1 T60DR   Final  . Pulse Gen Serial Number 07/01/2013 [id number]   Final  . RV Sense Sensitivity 07/01/2013 2   Final  . RA Pace Amplitude 07/01/2013 2   Final  . RV Pace PulseWidth 07/01/2013 0.6   Final  . RV Pace Amplitude 07/01/2013 3   Final  . Loletha Grayer RA Perc Paced 07/01/2013 69   Final  . Loletha Grayer RV Perc Paced 07/01/2013 100   Final  . Miscellaneous Comment 07/01/2013 Pacemaker check in clinic for battery only. Battery voltage 2.65 and aging since Mar 18, 2012. Longevity <0.5 year. Previously confirmed with [employer] at [employer] and reviewed with Dr. Lovena Le. Return to device clinic in 4-6 weeks.   Final      Assessment/Plan  1. Chest pain, unspecified Recommended EKG, but pt refused. Most likely had pain related to hiccups and esophageal irritation or spasm.  2. Hiccups resolved  3. HYPERTENSION controlled  4. GERD Continue omeprazole  5. Presence of permanent cardiac pacemaker To see Dr. Lovena Le 11/11/13

## 2013-09-25 ENCOUNTER — Other Ambulatory Visit: Payer: Self-pay | Admitting: *Deleted

## 2013-09-25 MED ORDER — PRAVASTATIN SODIUM 40 MG PO TABS: ORAL_TABLET | ORAL | Status: DC

## 2013-11-11 ENCOUNTER — Encounter: Payer: Self-pay | Admitting: Internal Medicine

## 2013-11-11 ENCOUNTER — Ambulatory Visit (INDEPENDENT_AMBULATORY_CARE_PROVIDER_SITE_OTHER): Payer: Medicare Other | Admitting: Internal Medicine

## 2013-11-11 VITALS — BP 139/56 | HR 60 | Ht 68.0 in | Wt 152.8 lb

## 2013-11-11 DIAGNOSIS — Z950 Presence of cardiac pacemaker: Secondary | ICD-10-CM

## 2013-11-11 DIAGNOSIS — I10 Essential (primary) hypertension: Secondary | ICD-10-CM

## 2013-11-11 DIAGNOSIS — I459 Conduction disorder, unspecified: Secondary | ICD-10-CM

## 2013-11-11 LAB — MDC_IDC_ENUM_SESS_TYPE_INCLINIC
Battery Impedance: 100 Ohm
Battery Remaining Longevity: 119 mo
Brady Statistic AP VP Percent: 78 %
Lead Channel Impedance Value: 435 Ohm
Lead Channel Pacing Threshold Pulse Width: 0.4 ms
Lead Channel Setting Pacing Amplitude: 2 V
Lead Channel Setting Pacing Amplitude: 2.5 V
Lead Channel Setting Sensing Sensitivity: 4 mV
MDC IDC MSMT BATTERY VOLTAGE: 2.79 V
MDC IDC MSMT LEADCHNL RA PACING THRESHOLD AMPLITUDE: 0.75 V
MDC IDC MSMT LEADCHNL RA PACING THRESHOLD PULSEWIDTH: 0.4 ms
MDC IDC MSMT LEADCHNL RA SENSING INTR AMPL: 2.8 mV
MDC IDC MSMT LEADCHNL RV IMPEDANCE VALUE: 453 Ohm
MDC IDC MSMT LEADCHNL RV PACING THRESHOLD AMPLITUDE: 1.25 V
MDC IDC SESS DTM: 20150623165403
MDC IDC SET LEADCHNL RV PACING PULSEWIDTH: 0.4 ms
MDC IDC STAT BRADY AP VS PERCENT: 0 %
MDC IDC STAT BRADY AS VP PERCENT: 22 %
MDC IDC STAT BRADY AS VS PERCENT: 0 %

## 2013-11-11 NOTE — Patient Instructions (Signed)
Your physician wants you to follow-up in: 9 months with Dr Knox Saliva will receive a reminder letter in the mail two months in advance. If you don't receive a letter, please call our office to schedule the follow-up appointment.   Remote monitoring is used to monitor your Pacemaker of ICD from home. This monitoring reduces the number of office visits required to check your device to one time per year. It allows [place] to keep an eye on the functioning of your device to ensure it is working properly. You are scheduled for a device check from home on 02/10/14. You may send your transmission at any time that day. If you have a wireless device, the transmission will be sent automatically. After your physician reviews your transmission, you will receive a postcard with your next transmission date.

## 2013-11-11 NOTE — Assessment & Plan Note (Signed)
His medtronic ppm is working normally. Will recheck in several months.

## 2013-11-11 NOTE — Assessment & Plan Note (Signed)
His blood pressure is well controlled. No change in meds. He will continue his current meds and maintain a low sodium diet.

## 2013-11-11 NOTE — Progress Notes (Signed)
HPI Eugene Long returns today for followup. He is a very pleasant 80 year old man with complete heart block, status post permanent pacemaker insertion. He also is a history of hypertension, diabetes, and dyslipidemia. Despite his advanced age, he continues to do well. He denies chest pain, shortness of breath, or peripheral edema. He has very mild arthritis in his neck and spine.  Allergies  Allergen Reactions  . Codeine Other (See Comments)    Runny eyes and nose,  Teary eyes  . Glyburide Other (See Comments)    Unknown  . Uroxatral [Alfuzosin] Other (See Comments)    Heart races     Current Outpatient Prescriptions  Medication Sig Dispense Refill  . amLODipine (NORVASC) 2.5 MG tablet Take 2.5 mg by mouth daily.      Marland Kitchen aspirin 81 MG tablet Take 81 mg by mouth daily.      . cholecalciferol (VITAMIN D) 1000 UNITS tablet Take 1,000 Units by mouth daily.      . finasteride (PROSCAR) 5 MG tablet Take 5 mg by mouth daily. One daily to treat enlarged prostate      . glimepiride (AMARYL) 1 MG tablet Take 1 mg by mouth daily. One daily at breakfast to treat diabetes      . ibuprofen (ADVIL,MOTRIN) 200 MG tablet Take 200 mg by mouth daily as needed for mild pain.       . metoprolol tartrate (LOPRESSOR) 25 MG tablet Take 37.5 mg by mouth 2 (two) times daily.      . mineral oil liquid Apply to eye daily as needed (for eye).       . Multiple Vitamins-Minerals (ICAPS PO) Take 1 capsule by mouth daily.       Marland Kitchen omeprazole (PRILOSEC) 40 MG capsule Take 40 mg by mouth daily.      . pravastatin (PRAVACHOL) 40 MG tablet Take one tablet by mouth once daily to control cholesterol  90 tablet  3  . sertraline (ZOLOFT) 50 MG tablet Take 50 mg by mouth daily.       No current facility-administered medications for this visit.     Past Medical History  Diagnosis Date  . Benign neoplasm of colon     Polyp  . Depressive disorder, not elsewhere classified   . Osteoarthrosis, unspecified whether generalized or  localized, unspecified site   . Complete rupture of rotator cuff   . Peripheral vascular disease, unspecified   . Acute venous embolism and thrombosis of unspecified deep vessels of lower extremity   . Esophageal reflux   . Other and unspecified hyperlipidemia   . Diabetes mellitus   . BPH (benign prostatic hypertrophy)   . Diverticulosis     colon, Hx of  . Hypertension   . 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 Anxiety   . Unspecified hearing loss   . Coronary atherosclerosis of native coronary artery   . Atrioventricular block, complete   . Stricture and stenosis of esophagus   . Diaphragmatic hernia without mention of obstruction or gangrene   . Diverticulosis of colon (without mention of hemorrhage)   . Unspecified constipation   . Hypertrophy of prostate with urinary obstruction and other lower urinary tract symptoms (LUTS)   . Other specified disorder of male genital organs(608.89)     right epididymal cyst  . Unspecified pruritic disorder   . Actinic keratosis   . Lumbago   . Contracture of palmar fascia   . Insomnia,  unspecified   . Dysphagia, unspecified(787.20)   . Chronic kidney disease, stage II (mild) 12/10/2012    ROS:   All systems reviewed and negative except as noted in the HPI.   Past Surgical History  Procedure Laterality Date  . Knee arthroscopy Left 1993  . Appendectomy  1942  . Pacemaker insertion  2005; 08/04/2013    Dual chamber MDT pacemaker implanted by Dr Lovena Le in 2005 for CHB; gen change by Dr Lovena Le 07/2013 - MDT ADDRL1   . Tonsillectomy  1931  . Arthoscopy right knee  1980  . Carotid endarterectomy  1987    right  . Cataract extraction  [id number]  . 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 Colonoscopy    . Colonoscopy  2005    Dr. Sharlett Iles  . Pacemaker generator change  08/04/13    Dr. Lovena Le     Family History  Problem Relation Age of Onset  . Cancer Father   . Heart disease  Sister     CHF     History   Social History  . Marital Status: Married    Spouse Name: N/A    Number of Children: N/A  . Years of Education: N/A   Occupational History  . retired Teacher, English as a foreign language    Social History Main Topics  . Smoking status: Former Smoker -- 0.50 packs/day for 17 years    Types: Cigarettes    Quit date: 05/22/1960  . Smokeless tobacco: Never Used  . Alcohol Use: No  . Drug Use: No  . Sexual Activity: No   Other Topics Concern  . Not on file   Social History Narrative   Patient [facility] at [facility] since 2003.   Widowed   Living will   In [place]   Pacemaker   Previously worked in Research officer, trade union     BP 139/56  Pulse 60  Ht 5\' 8"  (1.727 m)  Wt 152 lb 12.8 oz (69.31 kg)  BMI 23.24 kg/m2  Physical Exam:  Well appearing elderly man, NAD HEENT: Unremarkable Neck:  7 cm JVD, no thyromegally Lungs:  Clear with no wheezes, rales, or rhonchi. Well-healed pacemaker incision. HEART:  Regular rate rhythm, no murmurs, no rubs, no clicks Abd:  soft, positive bowel sounds, no organomegally, no rebound, no guarding Ext:  2 plus pulses, no edema, no cyanosis, no clubbing Skin:  No rashes no nodules Neuro:  CN II through XII intact, motor grossly intact   DEVICE  Normal device function.  See PaceArt for details. He is approaching elective replacement  Assess/Plan:

## 2013-11-12 ENCOUNTER — Encounter: Payer: Self-pay | Admitting: Internal Medicine

## 2013-11-17 ENCOUNTER — Encounter: Payer: Self-pay | Admitting: Internal Medicine

## 2013-11-17 ENCOUNTER — Ambulatory Visit (INDEPENDENT_AMBULATORY_CARE_PROVIDER_SITE_OTHER): Payer: Medicare Other | Admitting: Internal Medicine

## 2013-11-17 VITALS — BP 122/60 | HR 60 | Ht 68.0 in | Wt 154.4 lb

## 2013-11-17 DIAGNOSIS — Z860100 Personal history of colon polyps, unspecified: Secondary | ICD-10-CM

## 2013-11-17 DIAGNOSIS — K219 Gastro-esophageal reflux disease without esophagitis: Secondary | ICD-10-CM

## 2013-11-17 LAB — BASIC METABOLIC PANEL
BUN: 25 mg/dL — AB (ref 4–21)
Creatinine: 1.4 mg/dL — AB (ref 0.6–1.3)
GLUCOSE: 192 mg/dL
Potassium: 4 mmol/L (ref 3.4–5.3)
SODIUM: 137 mmol/L (ref 137–147)

## 2013-11-17 LAB — HEMOGLOBIN A1C: Hgb A1c MFr Bld: 6 % (ref 4.0–6.0)

## 2013-11-17 MED ORDER — OMEPRAZOLE 40 MG PO CPDR: 40.0000 mg | DELAYED_RELEASE_CAPSULE | Freq: Every day | ORAL | Status: DC

## 2013-11-17 NOTE — Progress Notes (Signed)
Patient ID: JAIR LINDBLAD, male   DOB: 20-Jul-1917, 80 y.o.   MRN: [id number] HPI: Mr. Winograd is a 80 year old male with a past medical history of GERD, hiatal hernia, colon polyps, diverticulosis, hypertension, hyperlipidemia, BPH who is seen in consultation at the request of Dr. Nyoka Cowden to evaluate GERD and dysphagia. He is here today with his daughter. He has a history of Dr. Sharlett Iles and reports having had 3 colonoscopies. He recalls the first in 1995 for 2 polyps were removed, subsequent colonoscopy in 2001 polyp removed, and last colonoscopy 2005 no polyps removed. He reports he had EGD at the time of each colonoscopy and had dilation performed on one of those 3 occasions. He reports he had return of GERD symptoms along with some mild solid food dysphagia. He also developed hiccups occurring on 2 separate occasions over the last 3 months. Each episode is separated about 3 weeks and the longest episode of hiccups lasted 90 minutes. He was started back on PPI after having been off for over a year in early May 2015. With reinitiation of omeprazole 40 mg daily he reports significant improvement in heartburn and dysphagia. Occasionally he feels like pills transit slowly to his stomach, but otherwise swallowing has improved. He denies weight loss. No nausea or vomiting. Good appetite. No hepatobiliary complaints. He does have a history of constipation which has responded well to daily Metamucil. He denies diarrhea. No blood in his stool or melena.  Past Medical History  Diagnosis Date  . Benign neoplasm of colon     Polyp  . Depressive disorder, not elsewhere classified   . Osteoarthrosis, unspecified whether generalized or localized, unspecified site   . Complete rupture of rotator cuff   . Peripheral vascular disease, unspecified   . Acute venous embolism and thrombosis of unspecified deep vessels of lower extremity   . Esophageal reflux   . Other and unspecified hyperlipidemia   . Diabetes mellitus   .  BPH (benign prostatic hypertrophy)   . Diverticulosis     colon, Hx of  . Hypertension   . 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 Anxiety   . Unspecified hearing loss   . Coronary atherosclerosis of native coronary artery   . Atrioventricular block, complete   . Stricture and stenosis of esophagus   . Diaphragmatic hernia without mention of obstruction or gangrene   . Diverticulosis of colon (without mention of hemorrhage)   . Unspecified constipation   . Hypertrophy of prostate with urinary obstruction and other lower urinary tract symptoms (LUTS)   . Other specified disorder of male genital organs(608.89)     right epididymal cyst  . Unspecified pruritic disorder   . Actinic keratosis   . Lumbago   . Contracture of palmar fascia   . Insomnia, unspecified   . Dysphagia, unspecified(787.20)   . Chronic kidney disease, stage II (mild) 12/10/2012    Past Surgical History  Procedure Laterality Date  . Knee arthroscopy Left 1993  . Appendectomy  1942  . Pacemaker insertion  2005; 08/04/2013    Dual chamber MDT pacemaker implanted by Dr Lovena Le in 2005 for CHB; gen change by Dr Lovena Le 07/2013 - MDT ADDRL1   . Tonsillectomy  1931  . Arthoscopy right knee  1980  . Carotid endarterectomy  1987    right  . Cataract extraction  [id number]  . 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 Colonoscopy    .  Colonoscopy  2005    Dr. Sharlett Iles  . Pacemaker generator change  08/04/13    Dr. Lovena Le    Current Outpatient Prescriptions  Medication Sig Dispense Refill  . amLODipine (NORVASC) 2.5 MG tablet Take 2.5 mg by mouth daily.      Marland Kitchen aspirin 81 MG tablet Take 81 mg by mouth daily.      . cholecalciferol (VITAMIN D) 1000 UNITS tablet Take 1,000 Units by mouth daily.      . finasteride (PROSCAR) 5 MG tablet Take 5 mg by mouth daily. One daily to treat enlarged prostate      . glimepiride (AMARYL) 1 MG tablet Take 1 mg by mouth daily. One daily  at breakfast to treat diabetes      . ibuprofen (ADVIL,MOTRIN) 200 MG tablet Take 200 mg by mouth daily as needed for mild pain.       . metoprolol tartrate (LOPRESSOR) 25 MG tablet Take 37.5 mg by mouth 2 (two) times daily.      . mineral oil liquid Apply to eye daily as needed (for eye).       . Multiple Vitamins-Minerals (ICAPS PO) Take 1 capsule by mouth daily.       Marland Kitchen omeprazole (PRILOSEC) 40 MG capsule Take 1 capsule (40 mg total) by mouth daily.  90 capsule  3  . pravastatin (PRAVACHOL) 40 MG tablet Take one tablet by mouth once daily to control cholesterol  90 tablet  3  . sertraline (ZOLOFT) 50 MG tablet Take 50 mg by mouth daily.       No current facility-administered medications for this visit.    Allergies  Allergen Reactions  . Codeine Other (See Comments)    Runny eyes and nose,  Teary eyes  . Glyburide Other (See Comments)    Unknown  . Uroxatral [Alfuzosin] Other (See Comments)    Heart races    Family History  Problem Relation Age of Onset  . Liver cancer Father   . Pancreatic cancer Father   . Heart disease Sister     CHF    History  Substance Use Topics  . Smoking status: Former Smoker -- 0.50 packs/day for 17 years    Types: Cigarettes    Quit date: 05/22/1960  . Smokeless tobacco: Never Used  . Alcohol Use: No    ROS: As per history of present illness, otherwise negative  BP 122/60  Pulse 60  Ht 5\' 8"  (1.727 m)  Wt 154 lb 6.4 oz (70.035 kg)  BMI 23.48 kg/m2 Constitutional: Well-developed and well-nourished. No distress. HEENT: Normocephalic and atraumatic. Oropharynx is clear and moist. No oropharyngeal exudate. Conjunctivae are normal.  Prosthetic left eye. No scleral icterus. Neck: Neck supple. Trachea midline. Cardiovascular: Normal rate, regular rhythm and intact distal pulses. Soft 2/6 systolic ejection murmur, pacemaker in place Pulmonary/chest: Effort normal and breath sounds normal. No wheezing, rales or rhonchi. Abdominal: Soft,  nontender, nondistended. Bowel sounds active throughout. There are no masses palpable. Extremities: no clubbing, cyanosis, or edema Neurological: Alert and oriented to person place and time. Skin: Skin is warm and dry. No rashes noted. Psychiatric: Normal mood and affect. Behavior is normal.  RELEVANT LABS AND IMAGING: CBC    Component Value Date/Time   WBC 7.6 07/29/2013 1055   RBC 4.07* 07/29/2013 1055   HGB 12.6* 07/29/2013 1055   HCT 37.8* 07/29/2013 1055   PLT 228.0 07/29/2013 1055   MCV 93.0 07/29/2013 1055   MCHC 33.5 07/29/2013 1055   RDW 12.8  07/29/2013 1055   LYMPHSABS 1.3 07/29/2013 1055   MONOABS 0.5 07/29/2013 1055   EOSABS 0.3 07/29/2013 1055   BASOSABS 0.0 07/29/2013 1055    CMP     Component Value Date/Time   NA 138 07/29/2013 1055   NA 140 05/26/2013   K 4.4 07/29/2013 1055   CL 106 07/29/2013 1055   CO2 27 07/29/2013 1055   GLUCOSE 207* 07/29/2013 1055   BUN 28* 07/29/2013 1055   BUN 25* 05/26/2013   CREATININE 1.6* 07/29/2013 1055   CREATININE 1.5* 05/26/2013   CALCIUM 8.9 07/29/2013 1055   AST 15 05/26/2013   ALT 9* 05/26/2013   ALKPHOS 55 05/26/2013    ASSESSMENT/PLAN:  80 year old male with a past medical history of GERD, hiatal hernia, colon polyps, diverticulosis, hypertension, hyperlipidemia, BPH who is seen in consultation at the request of Dr. Nyoka Cowden to evaluate GERD and dysphagia.   1.  GERD with dysphagia -- symptoms have improved dramatically with reinitiation of PPI. We discussed barium swallow a tablet for further evaluation of his esophagus and also the possibility of upper endoscopy with dilation. There are no further alarm symptoms after PPI initiation and he does not have a history of Barrett's esophagus. After discussion he would like to continue omeprazole on a daily basis and avoid further testing at this time. This is reasonable. I asked that he let me know should his heartburn returned or he develop worsening dysphagia or develop odynophagia. If this occurs I  would recommend barium swallow a tablet as the initial test. Both he and his daughter are called for with this plan  2.  History of colon polyps -- no lower GI complaints or alarm symptom. Screening colonoscopy previously discontinued due to age.  Given his age of 19 years, we will not repeat screening colonoscopy at this time.  He agrees with this plan   Return as needed

## 2013-11-17 NOTE — Patient Instructions (Signed)
We have sent the following medications to your pharmacy for you to pick up at your convenience: Omperazole 40 mg daily  Follow up as needed

## 2013-11-25 ENCOUNTER — Encounter: Payer: Self-pay | Admitting: Internal Medicine

## 2013-11-25 ENCOUNTER — Non-Acute Institutional Stay: Payer: Medicare Other | Admitting: Internal Medicine

## 2013-11-25 VITALS — BP 148/64 | HR 60 | Wt 154.0 lb

## 2013-11-25 DIAGNOSIS — E1159 Type 2 diabetes mellitus with other circulatory complications: Secondary | ICD-10-CM

## 2013-11-25 DIAGNOSIS — F329 Major depressive disorder, single episode, unspecified: Secondary | ICD-10-CM

## 2013-11-25 DIAGNOSIS — E785 Hyperlipidemia, unspecified: Secondary | ICD-10-CM

## 2013-11-25 DIAGNOSIS — R066 Hiccough: Secondary | ICD-10-CM

## 2013-11-25 DIAGNOSIS — I10 Essential (primary) hypertension: Secondary | ICD-10-CM

## 2013-11-25 DIAGNOSIS — Z950 Presence of cardiac pacemaker: Secondary | ICD-10-CM

## 2013-11-25 DIAGNOSIS — F3289 Other specified depressive episodes: Secondary | ICD-10-CM

## 2013-11-25 DIAGNOSIS — I739 Peripheral vascular disease, unspecified: Secondary | ICD-10-CM

## 2013-11-25 DIAGNOSIS — I459 Conduction disorder, unspecified: Secondary | ICD-10-CM

## 2013-11-25 DIAGNOSIS — E1149 Type 2 diabetes mellitus with other diabetic neurological complication: Secondary | ICD-10-CM

## 2013-11-25 DIAGNOSIS — M545 Low back pain, unspecified: Secondary | ICD-10-CM

## 2013-11-25 DIAGNOSIS — I2510 Atherosclerotic heart disease of native coronary artery without angina pectoris: Secondary | ICD-10-CM

## 2013-11-25 DIAGNOSIS — K219 Gastro-esophageal reflux disease without esophagitis: Secondary | ICD-10-CM

## 2013-11-25 DIAGNOSIS — E1129 Type 2 diabetes mellitus with other diabetic kidney complication: Secondary | ICD-10-CM

## 2013-11-26 DIAGNOSIS — E1142 Type 2 diabetes mellitus with diabetic polyneuropathy: Secondary | ICD-10-CM | POA: Insufficient documentation

## 2013-11-26 DIAGNOSIS — E1151 Type 2 diabetes mellitus with diabetic peripheral angiopathy without gangrene: Secondary | ICD-10-CM | POA: Insufficient documentation

## 2013-11-26 DIAGNOSIS — E1122 Type 2 diabetes mellitus with diabetic chronic kidney disease: Secondary | ICD-10-CM | POA: Insufficient documentation

## 2013-11-26 DIAGNOSIS — I2510 Atherosclerotic heart disease of native coronary artery without angina pectoris: Secondary | ICD-10-CM | POA: Insufficient documentation

## 2013-11-26 DIAGNOSIS — I129 Hypertensive chronic kidney disease with stage 1 through stage 4 chronic kidney disease, or unspecified chronic kidney disease: Secondary | ICD-10-CM

## 2013-11-26 DIAGNOSIS — N183 Chronic kidney disease, stage 3 (moderate): Secondary | ICD-10-CM

## 2013-11-26 MED ORDER — MIRTAZAPINE 15 MG PO TBDP: ORAL_TABLET | ORAL | Status: DC

## 2013-11-26 NOTE — Progress Notes (Signed)
Patient ID: Eugene Long, male   DOB: 16-Oct-1917, 80 y.o.   MRN: [id number]    Location:  [facility]   Place of Service: Clinic (12)    Allergies  Allergen Reactions  . Codeine Other (See Comments)    Runny eyes and nose,  Teary eyes  . Glyburide Other (See Comments)    Unknown  . Uroxatral [Alfuzosin] Other (See Comments)    Heart races    Chief Complaint  Patient presents with  . Medical Management of Chronic Issues    blood pressure, blood sugar, CKD, reflux  . Gastrophageal Reflux    saw Dr. Hilarie Fredrickson 11/17/13 for hiccups, back on Omeprazole, better.    HPI:   Pharmacogenetic testing completed 09/25/13. Suggests sertraline may interact with metoprolol and have a higher than usual risk for side effects.  Type II or unspecified type diabetes mellitus with renal manifestations, not stated as uncontrolled: controlled  Type II or unspecified type diabetes mellitus with peripheral circulatory disorders, not stated as uncontrolled(250.70): controlled  Type II or unspecified type diabetes mellitus with neurological manifestations, not stated as uncontrolled:controlled  DYSLIPIDEMIA: controlled  DEPRESSION: he is willing to substitute another medication for sertraline, but wants to stay on something for his depression.  HYPERTENSION: controlled  PERIPHERAL VASCULAR DISEASE: history of diminished pedal pulses  Gastroesophageal reflux disease without esophagitis: asymptomatic since on omeprazole 40 mg qd.   Hiccups: resolved with increase in strength of omeprazole  Midline low back pain without sciatica: mild and unchanged  Atherosclerosis of native coronary artery of native heart without angina pectoris: unchanged  Heart block: resolved with use of pacemaker  Presence of permanent cardiac pacemaker: working well at this time     Medications: Patient's Medications  New Prescriptions   No medications on file  Previous Medications   AMLODIPINE (NORVASC) 2.5 MG  TABLET    Take 2.5 mg by mouth daily.   ASPIRIN 81 MG TABLET    Take 81 mg by mouth daily.   CHOLECALCIFEROL (VITAMIN D) 1000 UNITS TABLET    Take 1,000 Units by mouth daily.   FINASTERIDE (PROSCAR) 5 MG TABLET    Take 5 mg by mouth daily. One daily to treat enlarged prostate   GLIMEPIRIDE (AMARYL) 1 MG TABLET    Take 1 mg by mouth daily. One daily at breakfast to treat diabetes   IBUPROFEN (ADVIL,MOTRIN) 200 MG TABLET    Take 200 mg by mouth daily as needed for mild pain.    METOPROLOL TARTRATE (LOPRESSOR) 25 MG TABLET    Take 37.5 mg by mouth 2 (two) times daily.   MINERAL OIL LIQUID    Apply to eye daily as needed (for eye).    MULTIPLE VITAMINS-MINERALS (ICAPS PO)    Take 1 capsule by mouth daily.    OMEPRAZOLE (PRILOSEC) 40 MG CAPSULE    Take 1 capsule (40 mg total) by mouth daily.   PRAVASTATIN (PRAVACHOL) 40 MG TABLET    Take one tablet by mouth once daily to control cholesterol   SERTRALINE (ZOLOFT) 50 MG TABLET    Take 50 mg by mouth daily.  Modified Medications   No medications on file  Discontinued Medications   No medications on file     Review of Systems  Constitutional: Negative for fever, chills, diaphoresis, activity change, appetite change, fatigue and unexpected weight change.  HENT: Positive for hearing loss. Negative for congestion and ear pain.        Significant loss of taste ability  Eyes: Negative for pain.       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  Respiratory: Negative for apnea, cough, choking, chest tightness and shortness of breath.   Cardiovascular: Positive for chest pain (episode on 09/22/13 has resolved). Negative for palpitations and leg swelling.  Gastrointestinal: Positive for constipation.       Flatulence. History hiatal hernia, GERD, esoph stricture with dilation, and diverticulosis. History colon polyps  Genitourinary:       Increased frequency of urination. Nocturia x3. Slow, weak urinary  stream. Urinary hesitation. History of BPH.  Musculoskeletal: Negative.   Neurological: Negative for tremors, seizures, syncope, speech difficulty, numbness and headaches.       History TIA  Hematological: Negative.   Psychiatric/Behavioral: Negative.     Filed Vitals:   11/25/13 0945  BP: 148/64  Pulse: 60  Weight: 154 lb (69.854 kg)   Body mass index is 23.42 kg/(m^2).  Physical Exam  Constitutional: He is oriented to person, place, and time. He appears well-developed and well-nourished.  HENT:  Head: Normocephalic and atraumatic.  Mouth/Throat: No oropharyngeal exudate.  Loss of hearing. Aid in right ear.  Eyes:  Artificial left eye. Rx lenses.  Neck: Neck supple. No JVD present. No tracheal deviation present. No thyromegaly present.  Cardiovascular: Normal rate, regular rhythm and normal heart sounds.   Pacemaker Diminished PT and DP bilaterally  Pulmonary/Chest: Effort normal and breath sounds normal. He has no wheezes. He has no rales.  Abdominal: Bowel sounds are normal. He exhibits no distension and no mass. There is no tenderness.  Musculoskeletal: Normal range of motion. He exhibits no edema and no tenderness.  Lymphadenopathy:    He has no cervical adenopathy.  Neurological: He is alert and oriented to person, place, and time. He displays normal reflexes. No cranial nerve deficit. He exhibits normal muscle tone. Coordination normal.  Skin: Skin is warm and dry.  Psychiatric: He has a normal mood and affect. His behavior is normal. Judgment and thought content normal.     Labs reviewed: Nursing Home on 11/25/2013  Component Date Value Ref Range Status  . Glucose 11/17/2013 192   Final  . BUN 11/17/2013 25* 4 - 21 mg/dL Final  . Creatinine 11/17/2013 1.4* 0.6 - 1.3 mg/dL Final  . Potassium 11/17/2013 4.0  3.4 - 5.3 mmol/L Final  . Sodium 11/17/2013 137  137 - 147 mmol/L Final  . Hemoglobin A1C 11/17/2013 6.0  4.0 - 6.0 % Final  Office Visit on 11/11/2013    Component Date Value Ref Range Status  . Date Time Interrogation Session 11/11/2013 [id number]   Final  . Pulse Generator Manufacturer 11/11/2013 [employer]   Final  . Pulse Gen Model 11/11/2013 ADDRL1 Adapta   Final  . Pulse Gen Serial Number 11/11/2013 [id number]   Final  . RV Sense Sensitivity 11/11/2013 4   Final  . RA Pace Amplitude 11/11/2013 2   Final  . RV Pace PulseWidth 11/11/2013 0.4   Final  . RV Pace Amplitude 11/11/2013 2.5   Final  . RA Impedance 11/11/2013 435   Final  . RA Amplitude 11/11/2013 2.8   Final  . RA Pacing Amplitude 11/11/2013 0.75   Final  . RA Pacing PulseWidth 11/11/2013 0.4   Final  . RV IMPEDANCE 11/11/2013 453   Final  . RV Pacing Amplitude 11/11/2013 1.25   Final  . RV Pacing PulseWidth 11/11/2013 0.4   Final  . Battery Status 11/11/2013 Unknown  Final  . Battery Longevity 11/11/2013 119   Final  . Battery Voltage 11/11/2013 2.79   Final  . Battery Impedance 11/11/2013 100   Final  . Loletha Grayer AP VP Percent 11/11/2013 78   Final  . Loletha Grayer AS VP Percent 11/11/2013 22   Final  . Loletha Grayer AP VS Percent 11/11/2013 0   Final  . Loletha Grayer AS VS Percent 11/11/2013 0   Final  . Eval Rhythm 11/11/2013 Vp @ 30 w/ PVCs   Final  . Miscellaneous Comment 11/11/2013    Final                   Value:Pacemaker check in clinic. Normal device function. Thresholds, sensing, impedances consistent with previous measurements. Device programmed to maximize longevity. No mode switch or high ventricular rates noted. Device programmed at appropriate safety                          margins---changed RV output from 3.0 to 2.5V. Histogram distribution flat but pt prefers rate sensor to remain off due to chest tightness when walking. Device programmed to optimize intrinsic conduction. Estimated longevity 67yrs. [facility] 02/10/14 & ROV                          w/ GT in 58mo.      Assessment/Plan  1. Type II or unspecified type diabetes mellitus with renal manifestations, not stated as  uncontrolled - Basic metabolic panel - Hemoglobin A1c  2. Type II or unspecified type diabetes mellitus with peripheral circulatory disorders, not stated as [id number](030.09) - Basic metabolic panel - Hemoglobin A1c  3. Type II or unspecified type diabetes mellitus with neurological manifestations, not stated as uncontrolled - Basic metabolic panel - Hemoglobin A1c  4. DYSLIPIDEMIA - Lipid panel; Future  5. DEPRESSION Stop sertraline based on Pharmacogenetic testing done for treatment guidance - mirtazapine (REMERON SOL-TAB) 15 MG disintegrating tablet; One nightly to help depression  Dispense: 30 tablet; Refill: 3  6. HYPERTENSION - Basic metabolic panel  7. PERIPHERAL VASCULAR DISEASE obsserve  8. Gastroesophageal reflux disease without esophagitis Continue omeprazole  9. Hiccups resolved  10. Midline low back pain without sciatica unchanged  11. Atherosclerosis of native coronary artery of native heart without angina pectoris unchanged - Lipid panel; Future  12. Heart block Resolved with pacemaker  13. Presence of permanent cardiac pacemaker Functioning well

## 2013-12-08 ENCOUNTER — Encounter: Payer: Self-pay | Admitting: Internal Medicine

## 2014-02-09 LAB — HM DIABETES EYE EXAM

## 2014-02-10 ENCOUNTER — Ambulatory Visit (INDEPENDENT_AMBULATORY_CARE_PROVIDER_SITE_OTHER): Payer: Medicare Other | Admitting: *Deleted

## 2014-02-10 ENCOUNTER — Other Ambulatory Visit: Payer: Self-pay | Admitting: *Deleted

## 2014-02-10 DIAGNOSIS — I442 Atrioventricular block, complete: Secondary | ICD-10-CM

## 2014-02-10 LAB — MDC_IDC_ENUM_SESS_TYPE_REMOTE
Battery Impedance: 100 Ohm
Battery Remaining Longevity: 140 mo
Battery Voltage: 2.78 V
Brady Statistic AP VS Percent: 0 %
Brady Statistic AS VP Percent: 21 %
Brady Statistic AS VS Percent: 0 %
Date Time Interrogation Session: 20150922112129
Lead Channel Impedance Value: 498 Ohm
Lead Channel Pacing Threshold Amplitude: 0.875 V
Lead Channel Pacing Threshold Amplitude: 1.125 V
Lead Channel Pacing Threshold Pulse Width: 0.4 ms
Lead Channel Sensing Intrinsic Amplitude: 2.8 mV
Lead Channel Setting Pacing Amplitude: 2 V
Lead Channel Setting Pacing Amplitude: 2.25 V
Lead Channel Setting Pacing Pulse Width: 0.4 ms
MDC IDC MSMT LEADCHNL RA IMPEDANCE VALUE: 453 Ohm
MDC IDC MSMT LEADCHNL RA PACING THRESHOLD PULSEWIDTH: 0.4 ms
MDC IDC SET LEADCHNL RV SENSING SENSITIVITY: 4 mV
MDC IDC STAT BRADY AP VP PERCENT: 79 %

## 2014-02-10 MED ORDER — AMLODIPINE BESYLATE 2.5 MG PO TABS: ORAL_TABLET | ORAL | Status: DC

## 2014-02-10 NOTE — Telephone Encounter (Signed)
[employer]

## 2014-02-10 NOTE — Progress Notes (Signed)
Remote pacemaker transmission.   

## 2014-02-24 ENCOUNTER — Encounter: Payer: Self-pay | Admitting: Internal Medicine

## 2014-03-23 LAB — LIPID PANEL
CHOLESTEROL: 118 mg/dL (ref 0–200)
HDL: 37 mg/dL (ref 35–70)
LDL CALC: 59 mg/dL
LDL/HDL RATIO: 3.2
TRIGLYCERIDES: 110 mg/dL (ref 40–160)

## 2014-03-23 LAB — BASIC METABOLIC PANEL
BUN: 22 mg/dL — AB (ref 4–21)
Creatinine: 1.6 mg/dL — AB (ref 0.6–1.3)
Glucose: 89 mg/dL
Potassium: 4.4 mmol/L (ref 3.4–5.3)
Sodium: 140 mmol/L (ref 137–147)

## 2014-03-23 LAB — HEMOGLOBIN A1C: Hgb A1c MFr Bld: 5.9 % (ref 4.0–6.0)

## 2014-03-31 ENCOUNTER — Encounter: Payer: Self-pay | Admitting: Internal Medicine

## 2014-03-31 ENCOUNTER — Non-Acute Institutional Stay: Payer: Medicare Other | Admitting: Internal Medicine

## 2014-03-31 VITALS — BP 128/56 | HR 68 | Wt 155.0 lb

## 2014-03-31 DIAGNOSIS — R059 Cough, unspecified: Secondary | ICD-10-CM

## 2014-03-31 DIAGNOSIS — F329 Major depressive disorder, single episode, unspecified: Secondary | ICD-10-CM

## 2014-03-31 DIAGNOSIS — F32A Depression, unspecified: Secondary | ICD-10-CM

## 2014-03-31 DIAGNOSIS — E1159 Type 2 diabetes mellitus with other circulatory complications: Secondary | ICD-10-CM

## 2014-03-31 DIAGNOSIS — E1151 Type 2 diabetes mellitus with diabetic peripheral angiopathy without gangrene: Secondary | ICD-10-CM

## 2014-03-31 DIAGNOSIS — E1142 Type 2 diabetes mellitus with diabetic polyneuropathy: Secondary | ICD-10-CM

## 2014-03-31 DIAGNOSIS — Z950 Presence of cardiac pacemaker: Secondary | ICD-10-CM

## 2014-03-31 DIAGNOSIS — R002 Palpitations: Secondary | ICD-10-CM

## 2014-03-31 DIAGNOSIS — N189 Chronic kidney disease, unspecified: Secondary | ICD-10-CM

## 2014-03-31 DIAGNOSIS — E1122 Type 2 diabetes mellitus with diabetic chronic kidney disease: Secondary | ICD-10-CM

## 2014-03-31 DIAGNOSIS — R05 Cough: Secondary | ICD-10-CM | POA: Insufficient documentation

## 2014-03-31 DIAGNOSIS — N182 Chronic kidney disease, stage 2 (mild): Secondary | ICD-10-CM

## 2014-03-31 DIAGNOSIS — E785 Hyperlipidemia, unspecified: Secondary | ICD-10-CM

## 2014-03-31 DIAGNOSIS — G629 Polyneuropathy, unspecified: Secondary | ICD-10-CM

## 2014-03-31 DIAGNOSIS — I10 Essential (primary) hypertension: Secondary | ICD-10-CM

## 2014-03-31 NOTE — Progress Notes (Signed)
Patient ID: Eugene Long, male   DOB: [birth year], 80 y.o.   MRN: [id number]    [facility]    Place of Service: [facility] ([facility] OFFICE   Allergies  Allergen Reactions  . Codeine Other (See Comments)    Runny eyes and nose,  Teary eyes  . Glyburide Other (See Comments)    Unknown  . Uroxatral [Alfuzosin] Other (See Comments)    Heart races    Chief Complaint  Patient presents with  . Medical Management of Chronic Issues    blood sugar, blood pressure, cholesterol, depression  . Medication Management    having trouble with Mirtazapine, after 3 days, buzzing in head, dizzy, cut it in half, didn't help, he stopped it. Now taking Zoloft 50mg  1/2 tablet once a week.    HPI:  Depression: mirtazapine made him feel odd. Had dizziness. He ultimately stopped it. Resumed sertraline 1/2 tab 1-2 times weekly.  Essential hypertension: controlled  Chronic kidney disease, stage II (mild): unchanged  Cough: improved  Hyperlipidemia: controlled  PACEMAKER-Medtronic: left upper chest. Functioning well  Palpitations: sometimes has what he calls a 'backwards beat". Can't feel his heartbeat at that time.  DM type 2 with diabetic peripheral neuropathy: controlled  Presence of permanent cardiac pacemaker: replaced 08/04/13  Type 2 diabetes mellitus with diabetic chronic kidney disease: controlled  DM (diabetes mellitus), type 2 with peripheral vascular complications: controlled    Medications: Patient's Medications  New Prescriptions   No medications on file  Previous Medications   AMLODIPINE (NORVASC) 2.5 MG TABLET    Take one tablet by mouth once daily to control blood pressure   ASPIRIN 81 MG TABLET    Take 81 mg by mouth daily.   CHOLECALCIFEROL (VITAMIN D) 1000 UNITS TABLET    Take 1,000 Units by mouth daily.   FINASTERIDE (PROSCAR) 5 MG TABLET    Take 5 mg by mouth daily. One daily to treat enlarged prostate   GLIMEPIRIDE (AMARYL) 1 MG TABLET    Take 1 mg by  mouth daily. One daily at breakfast to treat diabetes   IBUPROFEN (ADVIL,MOTRIN) 200 MG TABLET    Take 200 mg by mouth daily as needed for mild pain.    METOPROLOL TARTRATE (LOPRESSOR) 25 MG TABLET    Take 37.5 mg by mouth 2 (two) times daily.   MINERAL OIL LIQUID    Apply to eye daily as needed (for eye).    MIRTAZAPINE (REMERON SOL-TAB) 15 MG DISINTEGRATING TABLET    One nightly to help depression   MULTIPLE VITAMINS-MINERALS (ICAPS PO)    Take 1 capsule by mouth daily.    OMEPRAZOLE (PRILOSEC) 40 MG CAPSULE    Take 1 capsule (40 mg total) by mouth daily.   PRAVASTATIN (PRAVACHOL) 40 MG TABLET    Take one tablet by mouth once daily to control cholesterol   SERTRALINE (ZOLOFT) 50 MG TABLET    Take 50 mg by mouth. 1/2 tablet once a week  Modified Medications   No medications on file  Discontinued Medications   No medications on file     Review of Systems  Constitutional: Negative for fever, chills, diaphoresis, activity change, appetite change, fatigue and unexpected weight change.  HENT: Positive for hearing loss. Negative for congestion and ear pain.        Significant loss of taste ability  Eyes: Negative for pain.       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  Respiratory: Negative for apnea, cough, choking, chest tightness and shortness of breath.   Cardiovascular: Positive for chest pain (episode on 09/22/13 has resolved). Negative for palpitations and leg swelling.  Gastrointestinal: Positive for constipation.       Flatulence. History hiatal hernia, GERD, esoph stricture with dilation, and diverticulosis. History colon polyps  Genitourinary:       Increased frequency of urination. Nocturia x3. Slow, weak urinary stream. Urinary hesitation. History of BPH.  Musculoskeletal: Negative.   Neurological: Negative for tremors, seizures, syncope, speech difficulty, numbness and headaches.       History TIA  Hematological: Negative.     Psychiatric/Behavioral: Negative.     Filed Vitals:   03/31/14 0949  BP: 128/56  Pulse: 68  Weight: 155 lb (70.308 kg)  SpO2: 97%   Body mass index is 23.57 kg/(m^2).  Physical Exam  Constitutional: He is oriented to person, place, and time. He appears well-developed and well-nourished.  HENT:  Head: Normocephalic and atraumatic.  Mouth/Throat: No oropharyngeal exudate.  Loss of hearing. Aid in right ear.  Eyes:  Artificial left eye. Rx lenses.  Neck: Neck supple. No JVD present. No tracheal deviation present. No thyromegaly present.  Cardiovascular: Normal rate, regular rhythm and normal heart sounds.   Pacemaker Diminished PT and DP bilaterally  Pulmonary/Chest: Effort normal and breath sounds normal. He has no wheezes. He has no rales.  Abdominal: Bowel sounds are normal. He exhibits no distension and no mass. There is no tenderness.  Musculoskeletal: Normal range of motion. He exhibits no edema or tenderness.  Lymphadenopathy:    He has no cervical adenopathy.  Neurological: He is alert and oriented to person, place, and time. He displays normal reflexes. No cranial nerve deficit. He exhibits normal muscle tone. Coordination normal.  Skin: Skin is warm and dry.  Psychiatric: He has a normal mood and affect. His behavior is normal. Judgment and thought content normal.     Labs reviewed: Nursing Home on 03/31/2014  Component Date Value Ref Range Status  . Glucose 03/23/2014 89   Final  . BUN 03/23/2014 22* 4 - 21 mg/dL Final  . Creatinine 03/23/2014 1.6* 0.6 - 1.3 mg/dL Final  . Potassium 03/23/2014 4.4  3.4 - 5.3 mmol/L Final  . Sodium 03/23/2014 140  137 - 147 mmol/L Final  . LDl/HDL Ratio 03/23/2014 3.2   Final  . Triglycerides 03/23/2014 110  40 - 160 mg/dL Final  . Cholesterol 03/23/2014 118  0 - 200 mg/dL Final  . HDL 03/23/2014 37  35 - 70 mg/dL Final  . LDL Cholesterol 03/23/2014 59   Final  . Hgb A1c MFr Bld 03/23/2014 5.9  4.0 - 6.0 % Final  Scanned  Document on 02/24/2014  Component Date Value Ref Range Status  . HM Diabetic Eye Exam 02/09/2014 No Retinopathy  No Retinopathy Final  Clinical Support on 02/10/2014  Component Date Value Ref Range Status  . Date Time Interrogation Session 02/10/2014 [phone]   Final  . Pulse Generator Manufacturer 02/10/2014 Medtronic   Final  . Pulse Gen Model 02/10/2014 ADDRL1 Adapta   Final  . Pulse Gen Serial Number 02/10/2014 [id number]   Final  . RV Sense Sensitivity 02/10/2014 4   Final  . RA Pace Amplitude 02/10/2014 2   Final  . RV Pace PulseWidth 02/10/2014 0.4   Final  . RV Pace Amplitude 02/10/2014 2.25   Final  . RA Impedance 02/10/2014 453   Final  . RA Amplitude 02/10/2014 2.8   Final  .  RA Pacing Amplitude 02/10/2014 0.875   Final  . RA Pacing PulseWidth 02/10/2014 0.4   Final  . RV IMPEDANCE 02/10/2014 498   Final  . RV Pacing Amplitude 02/10/2014 1.125   Final  . RV Pacing PulseWidth 02/10/2014 0.4   Final  . Battery Status 02/10/2014 Unknown   Final  . Battery Longevity 02/10/2014 140   Final  . Battery Voltage 02/10/2014 2.78   Final  . Battery Impedance 02/10/2014 100   Final  . Loletha Grayer AP VP Percent 02/10/2014 79   Final  . Loletha Grayer AS VP Percent 02/10/2014 21   Final  . Loletha Grayer AP VS Percent 02/10/2014 0   Final  . Loletha Grayer AS VS Percent 02/10/2014 0   Final  . Eval Rhythm 02/10/2014 Ap/Vs w/ PVCs   Final  . Miscellaneous Comment 02/10/2014    Final                   Value:Pacemaker remote check. Device function reviewed. Impedance, sensing, auto capture thresholds consistent with previous measurements. Histograms blunted---sensor not on. All other diagnostic data reviewed and is appropriate and stable for patient. Real                          time/magnet EGM shows appropriate sensing and capture. 1 mode switch--- <0.1%. No ventricular high rate episodes. Estimated longevity 11.86yrs. [facility]link 05/13/14 & ROV w/ GT in 34mo.     Assessment/Plan  1. Depression Continue sertraline  25 mg up to 3 times weekly  2. Essential hypertension controlled  3. Chronic kidney disease, stage II (mild) unchanged  4. Cough improved  5. Hyperlipidemia controlled  6. PACEMAKER-Medtronic functioning  7. Palpitations observe  8. DM type 2 with diabetic peripheral neuropathy controlled  9. Presence of permanent cardiac pacemaker functioning  10. Type 2 diabetes mellitus with diabetic chronic kidney disease controlled  11. DM (diabetes mellitus), type 2 with peripheral vascular complications -W4M and BMP, future

## 2014-04-30 ENCOUNTER — Encounter (HOSPITAL_COMMUNITY): Payer: Self-pay | Admitting: Internal Medicine

## 2014-05-13 ENCOUNTER — Ambulatory Visit (INDEPENDENT_AMBULATORY_CARE_PROVIDER_SITE_OTHER): Payer: Medicare Other | Admitting: *Deleted

## 2014-05-13 ENCOUNTER — Encounter: Payer: Self-pay | Admitting: Internal Medicine

## 2014-05-13 DIAGNOSIS — Z950 Presence of cardiac pacemaker: Secondary | ICD-10-CM

## 2014-05-13 DIAGNOSIS — I442 Atrioventricular block, complete: Secondary | ICD-10-CM

## 2014-05-13 LAB — MDC_IDC_ENUM_SESS_TYPE_REMOTE
Battery Remaining Longevity: 144 mo
Brady Statistic AS VP Percent: 20.8 %
Brady Statistic AS VS Percent: 0.1 %
Lead Channel Impedance Value: 453 Ohm
Lead Channel Pacing Threshold Amplitude: 0.875 V
Lead Channel Pacing Threshold Pulse Width: 0.4 ms
Lead Channel Pacing Threshold Pulse Width: 0.4 ms
Lead Channel Setting Pacing Amplitude: 2 V
Lead Channel Setting Pacing Pulse Width: 0.4 ms
Lead Channel Setting Sensing Sensitivity: 4 mV
MDC IDC MSMT BATTERY VOLTAGE: 2.78 V
MDC IDC MSMT LEADCHNL RA IMPEDANCE VALUE: 460 Ohm
MDC IDC MSMT LEADCHNL RA SENSING INTR AMPL: 2.8 mV
MDC IDC MSMT LEADCHNL RV PACING THRESHOLD AMPLITUDE: 1 V
MDC IDC SET LEADCHNL RV PACING AMPLITUDE: 2.25 V
MDC IDC STAT BRADY AP VP PERCENT: 79.1 %
MDC IDC STAT BRADY AP VS PERCENT: 0.1 %

## 2014-05-13 NOTE — Progress Notes (Signed)
Remote pacemaker transmission.   

## 2014-05-19 ENCOUNTER — Encounter: Payer: Self-pay | Admitting: Cardiology

## 2014-06-24 ENCOUNTER — Other Ambulatory Visit: Payer: Self-pay | Admitting: *Deleted

## 2014-06-24 MED ORDER — FINASTERIDE 5 MG PO TABS: ORAL_TABLET | ORAL | Status: DC

## 2014-06-24 NOTE — Telephone Encounter (Signed)
[employer]

## 2014-07-27 LAB — BASIC METABOLIC PANEL
BUN: 25 mg/dL — AB (ref 4–21)
Creatinine: 1.6 mg/dL — AB (ref 0.6–1.3)
GLUCOSE: 94 mg/dL
Potassium: 4.3 mmol/L (ref 3.4–5.3)
Sodium: 136 mmol/L — AB (ref 137–147)

## 2014-07-27 LAB — HEMOGLOBIN A1C: Hgb A1c MFr Bld: 5.8 % (ref 4.0–6.0)

## 2014-08-04 ENCOUNTER — Non-Acute Institutional Stay: Payer: Medicare Other | Admitting: Internal Medicine

## 2014-08-04 ENCOUNTER — Encounter: Payer: Self-pay | Admitting: Internal Medicine

## 2014-08-04 VITALS — BP 122/58 | HR 62 | Temp 97.6°F | Wt 156.0 lb

## 2014-08-04 DIAGNOSIS — I10 Essential (primary) hypertension: Secondary | ICD-10-CM | POA: Diagnosis not present

## 2014-08-04 DIAGNOSIS — N189 Chronic kidney disease, unspecified: Secondary | ICD-10-CM

## 2014-08-04 DIAGNOSIS — R002 Palpitations: Secondary | ICD-10-CM

## 2014-08-04 DIAGNOSIS — H3530 Unspecified macular degeneration: Secondary | ICD-10-CM

## 2014-08-04 DIAGNOSIS — E1122 Type 2 diabetes mellitus with diabetic chronic kidney disease: Secondary | ICD-10-CM

## 2014-08-04 DIAGNOSIS — F329 Major depressive disorder, single episode, unspecified: Secondary | ICD-10-CM

## 2014-08-04 DIAGNOSIS — N182 Chronic kidney disease, stage 2 (mild): Secondary | ICD-10-CM | POA: Diagnosis not present

## 2014-08-04 DIAGNOSIS — Z45018 Encounter for adjustment and management of other part of cardiac pacemaker: Secondary | ICD-10-CM | POA: Diagnosis not present

## 2014-08-04 DIAGNOSIS — M75120 Complete rotator cuff tear or rupture of unspecified shoulder, not specified as traumatic: Secondary | ICD-10-CM

## 2014-08-04 DIAGNOSIS — Z9889 Other specified postprocedural states: Secondary | ICD-10-CM | POA: Diagnosis not present

## 2014-08-04 DIAGNOSIS — F32A Depression, unspecified: Secondary | ICD-10-CM

## 2014-08-04 NOTE — Progress Notes (Signed)
Patient ID: Eugene Long, male   DOB: 12-Oct-1917, 81 y.o.   MRN: [id number]    [facility]     Place of Service: Clinic (12)    Allergies  Allergen Reactions  . Codeine Other (See Comments)    Runny eyes and nose,  Teary eyes  . Glyburide Other (See Comments)    Unknown  . Uroxatral [Alfuzosin] Other (See Comments)    Heart races    Chief Complaint  Patient presents with  . Medical Management of Chronic Issues    blood pressure, blood sugar, depression . Checks blood sugar once a day, this morning was 98    HPI:  Macular degeneration: Vision in right eye getting worse. Followed regularly by ophthalmologist  CAROTID ENDARTERECTOMY, RIGHT, HX OF: Repaired in 1987. No problems since then. Has not had an ultrasound done recently.  Essential hypertension: Controlled  Type 2 diabetes mellitus with diabetic chronic kidney disease: Controlled  Fitting and adjustment of cardiac pacemaker: Followed regularly by Dr. Lovena Le  Palpitations: Occasional and mild. Not accompanied by chest pain, dizziness, or shortness of breath.  Complete rupture of rotator cuff, unspecified laterality: Bilateral discomfort. Left side is the worst. Right side was injured by repetitive use of a crosscut saw.  Chronic kidney disease, stage II (mild): Stable  Depression: Improved on sertraline    Medications: Patient's Medications  New Prescriptions   No medications on file  Previous Medications   AMLODIPINE (NORVASC) 2.5 MG TABLET    Take one tablet by mouth once daily to control blood pressure   ASPIRIN 81 MG TABLET    Take 81 mg by mouth daily.   CHOLECALCIFEROL (VITAMIN D) 1000 UNITS TABLET    Take 1,000 Units by mouth daily.   FINASTERIDE (PROSCAR) 5 MG TABLET    Take One tablet by mouth once daily to treat enlarged prostate   GLIMEPIRIDE (AMARYL) 1 MG TABLET    Take 1 mg by mouth daily. One daily at breakfast to treat diabetes   IBUPROFEN (ADVIL,MOTRIN) 200 MG TABLET    Take 200  mg by mouth daily as needed for mild pain.    LORATADINE (CLARITIN) 10 MG TABLET    Take 10 mg by mouth. Take one at bedtime for allergies   METOPROLOL TARTRATE (LOPRESSOR) 25 MG TABLET    Take 37.5 mg by mouth 2 (two) times daily.   MINERAL OIL LIQUID    Apply to eye daily as needed (for eye).    MULTIPLE VITAMINS-MINERALS (ICAPS PO)    Take 1 capsule by mouth daily.    OMEPRAZOLE (PRILOSEC) 40 MG CAPSULE    Take 1 capsule (40 mg total) by mouth daily.   PRAVASTATIN (PRAVACHOL) 40 MG TABLET    Take one tablet by mouth once daily to control cholesterol   SERTRALINE (ZOLOFT) 50 MG TABLET    Take 50 mg by mouth. 1/2 tablet once a week  Modified Medications   No medications on file  Discontinued Medications   MIRTAZAPINE (REMERON SOL-TAB) 15 MG DISINTEGRATING TABLET    One nightly to help depression     Review of Systems  Constitutional: Negative for fever, chills, diaphoresis, activity change, appetite change, fatigue and unexpected weight change.  HENT: Positive for hearing loss. Negative for congestion and ear pain.        Significant loss of taste ability  Eyes: Negative for pain.       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  Respiratory: Negative for apnea, cough, choking, chest tightness and shortness of breath.   Cardiovascular: Positive for chest pain (episode on 09/22/13 has resolved). Negative for palpitations and leg swelling.       Hx right carotid endarterectomy.  Gastrointestinal: Positive for constipation.       Flatulence. History hiatal hernia, GERD, esoph stricture with dilation, and diverticulosis. History colon polyps  Genitourinary:       Increased frequency of urination. Nocturia x3. Slow, weak urinary stream. Urinary hesitation. History of BPH.  Musculoskeletal: Negative.   Neurological: Negative for tremors, seizures, syncope, speech difficulty, numbness and headaches.       History TIA  Hematological: Negative.     Psychiatric/Behavioral: Negative.     Filed Vitals:   08/04/14 0915  BP: 122/58  Pulse: 62  Temp: 97.6 F (36.4 C)  TempSrc: Oral  Weight: 156 lb (70.761 kg)   Body mass index is 23.73 kg/(m^2).  Physical Exam  Constitutional: He is oriented to person, place, and time. He appears well-developed and well-nourished.  HENT:  Head: Normocephalic and atraumatic.  Mouth/Throat: No oropharyngeal exudate.  Loss of hearing. Aid in right ear.  Eyes:  Artificial left eye. Rx lenses.  Neck: Neck supple. No JVD present. No tracheal deviation present. No thyromegaly present.  Cardiovascular: Normal rate, regular rhythm and normal heart sounds.   Pacemaker Diminished PT and DP bilaterally  Pulmonary/Chest: Effort normal and breath sounds normal. He has no wheezes. He has no rales.  Abdominal: Bowel sounds are normal. He exhibits no distension and no mass. There is no tenderness.  Musculoskeletal: Normal range of motion. He exhibits no edema or tenderness.  Lymphadenopathy:    He has no cervical adenopathy.  Neurological: He is alert and oriented to person, place, and time. He displays normal reflexes. No cranial nerve deficit. He exhibits normal muscle tone. Coordination normal.  Skin: Skin is warm and dry.  Psychiatric: He has a normal mood and affect. His behavior is normal. Judgment and thought content normal.     Labs reviewed: Abstract on 07/29/2014  Component Date Value Ref Range Status  . Glucose 07/27/2014 94   Final  . BUN 07/27/2014 25* 4 - 21 mg/dL Final  . Creatinine 07/27/2014 1.6* 0.6 - 1.3 mg/dL Final  . Potassium 07/27/2014 4.3  3.4 - 5.3 mmol/L Final  . Sodium 07/27/2014 136* 137 - 147 mmol/L Final  . Hgb A1c MFr Bld 07/27/2014 5.8  4.0 - 6.0 % Final  Clinical Support on 05/13/2014  Component Date Value Ref Range Status  . Pulse Generator Manufacturer 05/13/2014 Medtronic   Final  . Pulse Gen Model 05/13/2014 ADDRL1 Adapta   Final  . Pulse Gen Serial Number  05/13/2014 [id number]   Final  . RV Sense Sensitivity 05/13/2014 4   Final  . RA Pace Amplitude 05/13/2014 2   Final  . RV Pace PulseWidth 05/13/2014 0.4   Final  . RV Pace Amplitude 05/13/2014 2.25   Final  . RA Impedance 05/13/2014 460   Final  . RA Amplitude 05/13/2014 2.8   Final  . RA Pacing Amplitude 05/13/2014 0.875   Final  . RA Pacing PulseWidth 05/13/2014 0.4   Final  . RV IMPEDANCE 05/13/2014 453   Final  . RV Pacing Amplitude 05/13/2014 1   Final  . RV Pacing PulseWidth 05/13/2014 0.4   Final  . Battery Longevity 05/13/2014 144   Final  . Battery Voltage 05/13/2014 2.78   Final  . Loletha Grayer AP VP Percent  05/13/2014 79.1   Final  . Loletha Grayer AS VP Percent 05/13/2014 20.8   Final  . Loletha Grayer AP VS Percent 05/13/2014 0.1   Final  . Loletha Grayer AS VS Percent 05/13/2014 0.1   Final  . Eval Rhythm 05/13/2014 As-Vp   Final  . Miscellaneous Comment 05/13/2014    Final                   Value:Pacemaker remote check. Device function reviewed. Impedance, sensing, auto capture thresholds consistent with previous measurements. Histograms appropriate for patient and level of activity. All other diagnostic data reviewed and is appropriate and  stable for patient. Real time/magnet EGM shows appropriate sensing and capture. 7 mode switches all < 1 minute.  No ventricular high rate episodes. Estimated longevity 12 years. Plan to follow in 3 months remotely, to see in office annually.  ROV  08/11/14@11 :15am with Dr. Lovena Le.      Assessment/Plan  1. Macular degeneration Continue with ophthalmology visits  2. CAROTID ENDARTERECTOMY, RIGHT, HX OF Consider follow-up ultrasound  3. Essential hypertension Controlled  4. Type 2 diabetes mellitus with diabetic chronic kidney disease Controlled  5. Fitting and adjustment of cardiac pacemaker Continue see cardiologist  6. Palpitations Scott cardiologist  7. Complete rupture of rotator cuff, unspecified laterality Offered physical therapy or orthopedic  consultation, but patient did not feel that he would benefit from either at this point.  8. Chronic kidney disease, stage II (mild) Stable  9. Depression Improved on sertraline

## 2014-08-11 ENCOUNTER — Encounter: Payer: Self-pay | Admitting: Internal Medicine

## 2014-08-11 ENCOUNTER — Ambulatory Visit (INDEPENDENT_AMBULATORY_CARE_PROVIDER_SITE_OTHER): Payer: Medicare Other | Admitting: Internal Medicine

## 2014-08-11 VITALS — BP 110/54 | HR 60 | Ht 68.0 in | Wt 154.0 lb

## 2014-08-11 DIAGNOSIS — I459 Conduction disorder, unspecified: Secondary | ICD-10-CM

## 2014-08-11 DIAGNOSIS — I10 Essential (primary) hypertension: Secondary | ICD-10-CM

## 2014-08-11 DIAGNOSIS — Z950 Presence of cardiac pacemaker: Secondary | ICD-10-CM

## 2014-08-11 LAB — MDC_IDC_ENUM_SESS_TYPE_INCLINIC
Battery Impedance: 111 Ohm
Battery Voltage: 2.79 V
Brady Statistic AS VP Percent: 21 %
Brady Statistic AS VS Percent: 0 %
Lead Channel Pacing Threshold Amplitude: 1 V
Lead Channel Pacing Threshold Pulse Width: 0.4 ms
Lead Channel Pacing Threshold Pulse Width: 0.4 ms
Lead Channel Sensing Intrinsic Amplitude: 2.8 mV
MDC IDC MSMT BATTERY REMAINING LONGEVITY: 137 mo
MDC IDC MSMT LEADCHNL RA IMPEDANCE VALUE: 467 Ohm
MDC IDC MSMT LEADCHNL RA PACING THRESHOLD AMPLITUDE: 1 V
MDC IDC MSMT LEADCHNL RV IMPEDANCE VALUE: 476 Ohm
MDC IDC SESS DTM: 20160322142935
MDC IDC SET LEADCHNL RA PACING AMPLITUDE: 2 V
MDC IDC SET LEADCHNL RV PACING AMPLITUDE: 2.25 V
MDC IDC SET LEADCHNL RV PACING PULSEWIDTH: 0.4 ms
MDC IDC SET LEADCHNL RV SENSING SENSITIVITY: 4 mV
MDC IDC STAT BRADY AP VP PERCENT: 79 %
MDC IDC STAT BRADY AP VS PERCENT: 0 %

## 2014-08-11 NOTE — Progress Notes (Signed)
HPI [name] returns today for followup. He is a very pleasant 81 year old man with complete heart block, status post permanent pacemaker insertion. He also is a history of hypertension, diabetes, and dyslipidemia. Despite his advanced age, he continues to do well. He denies chest pain, shortness of breath, or peripheral edema.  Allergies  Allergen Reactions  . Codeine Other (See Comments)    Runny eyes and nose,  Teary eyes  . Glyburide Other (See Comments)    Unknown  . Uroxatral [Alfuzosin] Other (See Comments)    Heart races     Current Outpatient Prescriptions  Medication Sig Dispense Refill  . amLODipine (NORVASC) 2.5 MG tablet Take one tablet by mouth once daily to control blood pressure 90 tablet 3  . aspirin 81 MG tablet Take 81 mg by mouth daily.    . cholecalciferol (VITAMIN D) 1000 UNITS tablet Take 1,000 Units by mouth daily.    . finasteride (PROSCAR) 5 MG tablet Take One tablet by mouth once daily to treat enlarged prostate 90 tablet 3  . glimepiride (AMARYL) 1 MG tablet Take 1 mg by mouth daily. One daily at breakfast to treat diabetes    . ibuprofen (ADVIL,MOTRIN) 200 MG tablet Take 200 mg by mouth daily as needed for mild pain.     Marland Kitchen loratadine (CLARITIN) 10 MG tablet Take one tablet by mouth at bedtime for allergies    . metoprolol tartrate (LOPRESSOR) 25 MG tablet Take 37.5 mg by mouth 2 (two) times daily.    . mineral oil liquid Apply 15 mLs to eye daily as needed (for eye).     . Multiple Vitamins-Minerals (ICAPS PO) Take 1 capsule by mouth daily.     Marland Kitchen omeprazole (PRILOSEC) 40 MG capsule Take 1 capsule (40 mg total) by mouth daily. 90 capsule 3  . pravastatin (PRAVACHOL) 40 MG tablet Take one tablet by mouth once daily to control cholesterol 90 tablet 3  . sertraline (ZOLOFT) 50 MG tablet Take 50 mg by mouth. 1/2 tablet once a week     No current facility-administered medications for this visit.     Past Medical History  Diagnosis Date  . Benign neoplasm of  colon     Polyp  . Depressive disorder, not elsewhere classified   . Osteoarthrosis, unspecified whether generalized or localized, unspecified site   . Complete rupture of rotator cuff   . Peripheral vascular disease, unspecified   . Acute venous embolism and thrombosis of unspecified deep vessels of lower extremity   . Esophageal reflux   . Other and unspecified hyperlipidemia   . Diabetes mellitus   . BPH (benign prostatic hypertrophy)   . Diverticulosis     colon, Hx of  . Hypertension   . 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 Anxiety   . Unspecified hearing loss   . Coronary atherosclerosis of native coronary artery   . Atrioventricular block, complete   . Stricture and stenosis of esophagus   . Diaphragmatic hernia without mention of obstruction or gangrene   . Diverticulosis of colon (without mention of hemorrhage)   . Unspecified constipation   . Hypertrophy of prostate with urinary obstruction and other lower urinary tract symptoms (LUTS)   . Other specified disorder of male genital organs(608.89)     right epididymal cyst  . Unspecified pruritic disorder   . Actinic keratosis   . Lumbago   . Contracture of palmar fascia   . Insomnia,  unspecified   . Dysphagia, unspecified(787.20)   . Chronic kidney disease, stage II (mild) 12/10/2012    ROS:   All systems reviewed and negative except as noted in the HPI.   Past Surgical History  Procedure Laterality Date  . Knee arthroscopy Left 1993  . Appendectomy  1942  . Pacemaker insertion  2005; 08/04/2013    Dual chamber MDT pacemaker implanted by Dr Lovena Le in 2005 for CHB; gen change by Dr Lovena Le 07/2013 - MDT ADDRL1   . Tonsillectomy  1931  . Arthoscopy right knee  1980  . Carotid endarterectomy  1987    right  . Cataract extraction  [id number]  . 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 Colonoscopy    . Colonoscopy  2005    Dr. Sharlett Iles  . Pacemaker generator  change  08/04/13    Dr. Lovena Le  . Permanent pacemaker generator change N/A 08/04/2013    Procedure: PERMANENT PACEMAKER GENERATOR CHANGE;  Surgeon: Evans Lance, MD;  [facility] CATH LAB;  Service: Cardiovascular;  Laterality: N/A;     Family History  Problem Relation Age of Onset  . Liver cancer Father   . Pancreatic cancer Father   . Heart disease Sister     CHF  . Cancer Father   . Diabetes Paternal Uncle      History   Social History  . Marital Status: Married    Spouse Name: N/A  . Number of Children: 3  . Years of Education: N/A   Occupational History  . retired Teacher, English as a foreign language    Social History Main Topics  . Smoking status: Former Smoker -- 0.50 packs/day for 17 years    Types: Cigarettes    Quit date: 05/22/1960  . Smokeless tobacco: Never Used  . Alcohol Use: No  . Drug Use: No  . Sexual Activity: No   Other Topics Concern  . Not on file   Social History Narrative   Patient [facility] at [facility] since 2003.   Widowed   Living will   In [place]   Pacemaker   Previously worked in Research officer, trade [employer]   Exercise walks 15-20 minutes twice a day most days     BP 110/54 mmHg  Pulse 60  Ht 5\' 8"  (1.727 m)  Wt 154 lb (69.854 kg)  BMI 23.42 kg/m2  Physical Exam:  Well appearing elderly man, NAD HEENT: Unremarkable Neck:  7 cm JVD, no thyromegally Lungs:  Clear with no wheezes, rales, or rhonchi. Well-healed pacemaker incision. HEART:  Regular rate rhythm, no murmurs, no rubs, no clicks Abd:  soft, positive bowel sounds, no organomegally, no rebound, no guarding Ext:  2 plus pulses, no edema, no cyanosis, no clubbing Skin:  No rashes no nodules Neuro:  CN II through XII intact, motor grossly intact   DEVICE  Normal device function.  See PaceArt for details. He is approaching elective replacement  Assess/Plan:

## 2014-08-11 NOTE — Assessment & Plan Note (Signed)
He has had no recurrent syncope since his PM was placed. Will follow.

## 2014-08-11 NOTE — Assessment & Plan Note (Signed)
His blood pressure is well controlled. Will follow. He will maintain a low sodium diet and continue his current meds.

## 2014-08-11 NOTE — Assessment & Plan Note (Signed)
His medtronic DDD PM is working normally. Will recheck in several months. 

## 2014-08-11 NOTE — Patient Instructions (Signed)
Your physician wants you to follow-up in: 12 months with Dr. Taylor. You will receive a reminder letter in the mail two months in advance. If you don't receive a letter, please call our office to schedule the follow-up appointment.    

## 2014-08-21 ENCOUNTER — Encounter: Payer: Self-pay | Admitting: Internal Medicine

## 2014-09-01 ENCOUNTER — Other Ambulatory Visit: Payer: Self-pay | Admitting: *Deleted

## 2014-09-01 MED ORDER — GLIMEPIRIDE 1 MG PO TABS: ORAL_TABLET | ORAL | Status: DC

## 2014-09-01 NOTE — Telephone Encounter (Signed)
[facility] 

## 2014-11-10 ENCOUNTER — Ambulatory Visit (INDEPENDENT_AMBULATORY_CARE_PROVIDER_SITE_OTHER): Payer: Medicare Other | Admitting: *Deleted

## 2014-11-10 DIAGNOSIS — I442 Atrioventricular block, complete: Secondary | ICD-10-CM | POA: Diagnosis not present

## 2014-11-10 NOTE — Progress Notes (Signed)
Remote pacemaker transmission.   

## 2014-11-11 LAB — CUP PACEART REMOTE DEVICE CHECK
Battery Impedance: 111 Ohm
Battery Voltage: 2.78 V
Brady Statistic AP VP Percent: 76 %
Brady Statistic AS VP Percent: 24 %
Brady Statistic AS VS Percent: 0 %
Date Time Interrogation Session: 20160621121641
Lead Channel Impedance Value: 456 Ohm
Lead Channel Impedance Value: 461 Ohm
Lead Channel Pacing Threshold Pulse Width: 0.4 ms
Lead Channel Pacing Threshold Pulse Width: 0.4 ms
Lead Channel Sensing Intrinsic Amplitude: 2.8 mV
Lead Channel Setting Pacing Amplitude: 2.25 V
Lead Channel Setting Sensing Sensitivity: 4 mV
MDC IDC MSMT BATTERY REMAINING LONGEVITY: 135 mo
MDC IDC MSMT LEADCHNL RA PACING THRESHOLD AMPLITUDE: 0.875 V
MDC IDC MSMT LEADCHNL RV PACING THRESHOLD AMPLITUDE: 1.125 V
MDC IDC SET LEADCHNL RA PACING AMPLITUDE: 2 V
MDC IDC SET LEADCHNL RV PACING PULSEWIDTH: 0.4 ms
MDC IDC STAT BRADY AP VS PERCENT: 0 %

## 2014-11-24 ENCOUNTER — Encounter: Payer: Self-pay | Admitting: Cardiology

## 2014-11-26 ENCOUNTER — Other Ambulatory Visit: Payer: Self-pay | Admitting: *Deleted

## 2014-11-27 ENCOUNTER — Encounter: Payer: Self-pay | Admitting: Internal Medicine

## 2014-12-07 LAB — HM DIABETES EYE EXAM

## 2014-12-08 ENCOUNTER — Encounter: Payer: Self-pay | Admitting: *Deleted

## 2014-12-23 ENCOUNTER — Other Ambulatory Visit: Payer: Self-pay | Admitting: *Deleted

## 2014-12-23 MED ORDER — OMEPRAZOLE 40 MG PO CPDR: 40.0000 mg | DELAYED_RELEASE_CAPSULE | Freq: Every day | ORAL | Status: DC

## 2014-12-23 MED ORDER — PRAVASTATIN SODIUM 40 MG PO TABS: ORAL_TABLET | ORAL | Status: DC

## 2015-01-21 LAB — BASIC METABOLIC PANEL
BUN: 23 mg/dL — AB (ref 4–21)
Creatinine: 1.6 mg/dL — AB (ref <=1.3)
Glucose: 92 mg/dL
Potassium: 4.2 mmol/L (ref 3.4–5.3)
Sodium: 139 mmol/L (ref 137–147)

## 2015-01-21 LAB — HEMOGLOBIN A1C: Hgb A1c MFr Bld: 5.7 % (ref 4.0–6.0)

## 2015-01-21 LAB — LIPID PANEL
Cholesterol: 141 mg/dL (ref 0–200)
HDL: 46 mg/dL (ref 35–70)
LDL CALC: 76 mg/dL
TRIGLYCERIDES: 93 mg/dL (ref 40–160)

## 2015-01-22 ENCOUNTER — Encounter: Payer: Self-pay | Admitting: *Deleted

## 2015-02-02 ENCOUNTER — Encounter: Payer: Self-pay | Admitting: Internal Medicine

## 2015-02-02 ENCOUNTER — Non-Acute Institutional Stay: Payer: Medicare Other | Admitting: Internal Medicine

## 2015-02-02 VITALS — BP 130/62 | HR 60 | Temp 97.6°F | Wt 152.0 lb

## 2015-02-02 DIAGNOSIS — I10 Essential (primary) hypertension: Secondary | ICD-10-CM

## 2015-02-02 DIAGNOSIS — E785 Hyperlipidemia, unspecified: Secondary | ICD-10-CM | POA: Diagnosis not present

## 2015-02-02 DIAGNOSIS — R002 Palpitations: Secondary | ICD-10-CM | POA: Diagnosis not present

## 2015-02-02 DIAGNOSIS — N189 Chronic kidney disease, unspecified: Secondary | ICD-10-CM | POA: Diagnosis not present

## 2015-02-02 DIAGNOSIS — I429 Cardiomyopathy, unspecified: Secondary | ICD-10-CM | POA: Diagnosis not present

## 2015-02-02 DIAGNOSIS — I428 Other cardiomyopathies: Secondary | ICD-10-CM

## 2015-02-02 DIAGNOSIS — E1159 Type 2 diabetes mellitus with other circulatory complications: Secondary | ICD-10-CM

## 2015-02-02 DIAGNOSIS — F32A Depression, unspecified: Secondary | ICD-10-CM

## 2015-02-02 DIAGNOSIS — E1151 Type 2 diabetes mellitus with diabetic peripheral angiopathy without gangrene: Secondary | ICD-10-CM

## 2015-02-02 DIAGNOSIS — F329 Major depressive disorder, single episode, unspecified: Secondary | ICD-10-CM | POA: Diagnosis not present

## 2015-02-02 DIAGNOSIS — E1122 Type 2 diabetes mellitus with diabetic chronic kidney disease: Secondary | ICD-10-CM | POA: Diagnosis not present

## 2015-02-02 MED ORDER — SERTRALINE HCL 50 MG PO TABS: ORAL_TABLET | ORAL | Status: DC

## 2015-02-02 NOTE — Progress Notes (Signed)
Patient ID: Eugene Long, male   DOB: 21-Mar-1918, 81 y.o.   MRN: [id number]    [facility]     Place of Service: Clinic (12)     Allergies  Allergen Reactions  . Codeine Other (See Comments)    Runny eyes and nose,  Teary eyes  . Glyburide Other (See Comments)    Unknown  . Uroxatral [Alfuzosin] Other (See Comments)    Heart races    Chief Complaint  Patient presents with  . Medical Management of Chronic Issues    blood pressure, blood sugar, depression. Blood Sugar last night 97    HPI:  DM (diabetes mellitus), type 2 with peripheral vascular complications -controlled   Type 2 diabetes mellitus with diabetic chronic kidney disease - Controlled with normal BUN 23 and mild elevation of creatinine at 1.6  Depression - Currently on very low-dose of Zoloft using it about 25 mg every fifth day. He feels that he still needs his medication all anxiety and lightheaded feelings.  Essential hypertension - controlled  Hyperlipidemia - Controlled  Non-ischemic cardiomyopathy - Mild shortness of breath walking  Palpitations - None in several months    Medications: Patient's Medications  New Prescriptions   No medications on file  Previous Medications   AMLODIPINE (NORVASC) 2.5 MG TABLET    Take one tablet by mouth once daily to control blood pressure   ASPIRIN 81 MG TABLET    Take 81 mg by mouth daily.   CHOLECALCIFEROL (VITAMIN D) 1000 UNITS TABLET    Take 1,000 Units by mouth daily.   FINASTERIDE (PROSCAR) 5 MG TABLET    Take One tablet by mouth once daily to treat enlarged prostate   GLIMEPIRIDE (AMARYL) 1 MG TABLET    Take One tablet by mouth once daily at breakfast to treat diabetes   IBUPROFEN (ADVIL,MOTRIN) 200 MG TABLET    Take 200 mg by mouth daily as needed for mild pain.    LORATADINE (CLARITIN) 10 MG TABLET    Take one tablet by mouth at bedtime for allergies   METOPROLOL TARTRATE (LOPRESSOR) 25 MG TABLET    Take 37.5 mg by mouth 2 (two) times daily.    MINERAL OIL LIQUID    Apply 15 mLs to eye daily as needed (for eye).    MULTIPLE VITAMINS-MINERALS (ICAPS PO)    Take 1 capsule by mouth daily.    OMEPRAZOLE (PRILOSEC) 40 MG CAPSULE    Take 1 capsule (40 mg total) by mouth daily.   PRAVASTATIN (PRAVACHOL) 40 MG TABLET    Take one tablet by mouth once daily to control cholesterol   SERTRALINE (ZOLOFT) 50 MG TABLET    Take 50 mg by mouth. 1/2 tablet once a week  Modified Medications   No medications on file  Discontinued Medications   No medications on file     Review of Systems  Constitutional: Negative for fever, chills, diaphoresis, activity change, appetite change, fatigue and unexpected weight change.  HENT: Positive for hearing loss. Negative for congestion and ear pain.        Significant loss of taste ability  Eyes: Negative for pain.       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  Respiratory: Negative for apnea, cough, choking, chest tightness and shortness of breath.   Cardiovascular: Positive for chest pain (episode on 09/22/13 has resolved). Negative for palpitations and leg swelling.       Hx right carotid endarterectomy.  Gastrointestinal: Positive for constipation.       Flatulence. History hiatal hernia, GERD, esoph stricture with dilation, and diverticulosis. History colon polyps  Genitourinary:       Increased frequency of urination. Nocturia x3. Slow, weak urinary stream. Urinary hesitation. History of BPH.  Musculoskeletal: Negative.   Neurological: Negative for tremors, seizures, syncope, speech difficulty, numbness and headaches.       History TIA  Hematological: Negative.   Psychiatric/Behavioral: Negative.     Filed Vitals:   02/02/15 0915  BP: 130/62  Pulse: 60  Temp: 97.6 F (36.4 C)  TempSrc: Oral  Weight: 152 lb (68.947 kg)  SpO2: 99%   Body mass index is 23.12 kg/(m^2).  Physical Exam  Constitutional: He is oriented to person, place, and time.  He appears well-developed and well-nourished.  HENT:  Head: Normocephalic and atraumatic.  Mouth/Throat: No oropharyngeal exudate.  Loss of hearing. Aid in right ear.  Eyes:  Artificial left eye. Rx lenses.  Neck: Neck supple. No JVD present. No tracheal deviation present. No thyromegaly present.  Cardiovascular: Normal rate, regular rhythm and normal heart sounds.   Pacemaker Diminished PT and DP bilaterally  Pulmonary/Chest: Effort normal and breath sounds normal. He has no wheezes. He has no rales.  Abdominal: Bowel sounds are normal. He exhibits no distension and no mass. There is no tenderness.  Musculoskeletal: Normal range of motion. He exhibits no edema or tenderness.  Lymphadenopathy:    He has no cervical adenopathy.  Neurological: He is alert and oriented to person, place, and time. He displays normal reflexes. No cranial nerve deficit. He exhibits normal muscle tone. Coordination normal.  Skin: Skin is warm and dry.  Psychiatric: He has a normal mood and affect. His behavior is normal. Judgment and thought content normal.     Labs reviewed: Lab Summary Latest Ref Rng 01/21/2015 07/27/2014 03/23/2014 11/17/2013 07/29/2013  Hemoglobin 13.0 - 17.0 g/dL (None) (None) (None) (None) 12.6(L)  Hematocrit 39.0 - 52.0 % (None) (None) (None) (None) 37.8(L)  White count 4.5 - 10.5 K/uL (None) (None) (None) (None) 7.6  Platelet count 150.0 - 400.0 K/uL (None) (None) (None) (None) 228.0  Sodium 137 - 147 mmol/L 139 136(A) 140 137 138  Potassium 3.4 - 5.3 mmol/L 4.2 4.3 4.4 4.0 4.4  Calcium 8.4 - 10.5 mg/dL (None) (None) (None) (None) 8.9  Phosphorus - (None) (None) (None) (None) (None)  Creatinine .6 - 1.3 mg/dL 1.6(A) 1.6(A) 1.6(A) 1.4(A) 1.6(H)  AST - (None) (None) (None) (None) (None)  Alk Phos - (None) (None) (None) (None) (None)  Bilirubin - (None) (None) (None) (None) (None)  Glucose - 92 94 89 192 207(H)  Cholesterol 0 - 200 mg/dL 141 (None) 118 (None) (None)  HDL cholesterol 35 -  70 mg/dL 46 (None) 37 (None) (None)  Triglycerides 40 - 160 mg/dL 93 (None) 110 (None) (None)  LDL Direct - (None) (None) (None) (None) (None)  LDL Calc - 76 (None) 59 (None) (None)  Total protein - (None) (None) (None) (None) (None)  Albumin - (None) (None) (None) (None) (None)   No results found for: TSH, T3TOTAL, T4TOTAL, THYROIDAB Lab Results  Component Value Date   BUN 23* 01/21/2015   Lab Results  Component Value Date   HGBA1C 5.7 01/21/2015       Assessment/Plan  1. DM (diabetes mellitus), type 2 with peripheral vascular complications Current medication  2. Type 2 diabetes mellitus with diabetic chronic kidney disease Continue current medication  3. Depression - sertraline (ZOLOFT) 50 MG tablet;  1/2 tablet every 5 days to help anxiety and depression  Dispense: 45 tablet; Refill: 3  4. Essential hypertension Continue current medication  5. Hyperlipidemia Continue pravastatin  6. Non-ischemic cardiomyopathy Continue current medications   7. Palpitations Resolved

## 2015-02-03 ENCOUNTER — Other Ambulatory Visit: Payer: Self-pay | Admitting: *Deleted

## 2015-02-03 DIAGNOSIS — F32A Depression, unspecified: Secondary | ICD-10-CM

## 2015-02-03 DIAGNOSIS — F329 Major depressive disorder, single episode, unspecified: Secondary | ICD-10-CM

## 2015-02-03 MED ORDER — SERTRALINE HCL 50 MG PO TABS: ORAL_TABLET | ORAL | Status: DC

## 2015-02-03 NOTE — Telephone Encounter (Signed)
[employer]

## 2015-02-04 ENCOUNTER — Other Ambulatory Visit: Payer: Self-pay | Admitting: *Deleted

## 2015-02-04 DIAGNOSIS — F329 Major depressive disorder, single episode, unspecified: Secondary | ICD-10-CM

## 2015-02-04 DIAGNOSIS — F32A Depression, unspecified: Secondary | ICD-10-CM

## 2015-02-04 MED ORDER — SERTRALINE HCL 50 MG PO TABS: ORAL_TABLET | ORAL | Status: DC

## 2015-02-04 NOTE — Telephone Encounter (Signed)
[employer]

## 2015-02-05 ENCOUNTER — Other Ambulatory Visit: Payer: Self-pay | Admitting: *Deleted

## 2015-02-05 DIAGNOSIS — F329 Major depressive disorder, single episode, unspecified: Secondary | ICD-10-CM

## 2015-02-05 DIAGNOSIS — F32A Depression, unspecified: Secondary | ICD-10-CM

## 2015-02-05 MED ORDER — SERTRALINE HCL 50 MG PO TABS: ORAL_TABLET | ORAL | Status: DC

## 2015-02-05 NOTE — Telephone Encounter (Signed)
[employer]

## 2015-02-08 ENCOUNTER — Other Ambulatory Visit: Payer: Self-pay | Admitting: *Deleted

## 2015-02-08 DIAGNOSIS — F32A Depression, unspecified: Secondary | ICD-10-CM

## 2015-02-08 DIAGNOSIS — F329 Major depressive disorder, single episode, unspecified: Secondary | ICD-10-CM

## 2015-02-08 MED ORDER — SERTRALINE HCL 50 MG PO TABS: ORAL_TABLET | ORAL | Status: DC

## 2015-02-08 NOTE — Telephone Encounter (Signed)
[employer]

## 2015-02-09 ENCOUNTER — Ambulatory Visit (INDEPENDENT_AMBULATORY_CARE_PROVIDER_SITE_OTHER): Payer: Medicare Other | Admitting: *Deleted

## 2015-02-09 ENCOUNTER — Telehealth: Payer: Self-pay | Admitting: Cardiology

## 2015-02-09 DIAGNOSIS — I442 Atrioventricular block, complete: Secondary | ICD-10-CM | POA: Diagnosis not present

## 2015-02-09 NOTE — Telephone Encounter (Signed)
LMOVM reminding pt to send remote transmission.   

## 2015-02-09 NOTE — Progress Notes (Signed)
Remote pacemaker transmission.   

## 2015-02-15 LAB — CUP PACEART REMOTE DEVICE CHECK
Battery Impedance: 135 Ohm
Battery Remaining Longevity: 130 mo
Battery Voltage: 2.78 V
Brady Statistic AP VS Percent: 0 %
Brady Statistic AS VP Percent: 25 %
Lead Channel Impedance Value: 473 Ohm
Lead Channel Setting Pacing Pulse Width: 0.4 ms
Lead Channel Setting Sensing Sensitivity: 4 mV
MDC IDC MSMT LEADCHNL RV IMPEDANCE VALUE: 485 Ohm
MDC IDC SESS DTM: 20160920142416
MDC IDC SET LEADCHNL RA PACING AMPLITUDE: 2 V
MDC IDC SET LEADCHNL RV PACING AMPLITUDE: 2.25 V
MDC IDC STAT BRADY AP VP PERCENT: 75 %
MDC IDC STAT BRADY AS VS PERCENT: 0 %

## 2015-02-16 ENCOUNTER — Other Ambulatory Visit: Payer: Self-pay | Admitting: *Deleted

## 2015-02-16 MED ORDER — AMLODIPINE BESYLATE 2.5 MG PO TABS: ORAL_TABLET | ORAL | Status: DC

## 2015-02-18 ENCOUNTER — Other Ambulatory Visit: Payer: Self-pay | Admitting: *Deleted

## 2015-02-18 MED ORDER — METOPROLOL TARTRATE 25 MG PO TABS: 37.5000 mg | ORAL_TABLET | Freq: Two times a day (BID) | ORAL | Status: DC

## 2015-02-18 NOTE — Telephone Encounter (Signed)
[employer]

## 2015-03-02 ENCOUNTER — Encounter: Payer: Self-pay | Admitting: Cardiology

## 2015-03-11 ENCOUNTER — Encounter: Payer: Self-pay | Admitting: Internal Medicine

## 2015-05-18 ENCOUNTER — Ambulatory Visit (INDEPENDENT_AMBULATORY_CARE_PROVIDER_SITE_OTHER): Payer: Medicare Other | Admitting: *Deleted

## 2015-05-18 DIAGNOSIS — I442 Atrioventricular block, complete: Secondary | ICD-10-CM

## 2015-05-18 NOTE — Progress Notes (Signed)
Remote pacemaker transmission.   

## 2015-05-19 LAB — CUP PACEART REMOTE DEVICE CHECK
Battery Impedance: 135 Ohm
Battery Remaining Longevity: 120 mo
Battery Voltage: 2.78 V
Brady Statistic AP VP Percent: 73 %
Brady Statistic AS VS Percent: 0 %
Implantable Lead Implant Date: 20050825
Implantable Lead Implant Date: 20050825
Implantable Lead Location: 753860
Implantable Lead Model: 5076
Lead Channel Impedance Value: 463 Ohm
Lead Channel Impedance Value: 466 Ohm
Lead Channel Sensing Intrinsic Amplitude: 2.8 mV
Lead Channel Setting Pacing Amplitude: 2 V
Lead Channel Setting Pacing Amplitude: 2.5 V
Lead Channel Setting Pacing Pulse Width: 0.4 ms
MDC IDC LEAD LOCATION: 753859
MDC IDC MSMT LEADCHNL RA PACING THRESHOLD AMPLITUDE: 1 V
MDC IDC MSMT LEADCHNL RA PACING THRESHOLD PULSEWIDTH: 0.4 ms
MDC IDC MSMT LEADCHNL RV PACING THRESHOLD AMPLITUDE: 1.25 V
MDC IDC MSMT LEADCHNL RV PACING THRESHOLD PULSEWIDTH: 0.4 ms
MDC IDC SESS DTM: 20161227141733
MDC IDC SET LEADCHNL RV SENSING SENSITIVITY: 4 mV
MDC IDC STAT BRADY AP VS PERCENT: 0 %
MDC IDC STAT BRADY AS VP PERCENT: 27 %

## 2015-05-20 ENCOUNTER — Encounter: Payer: Self-pay | Admitting: Cardiology

## 2015-06-03 LAB — HEPATIC FUNCTION PANEL
ALK PHOS: 48 U/L (ref 25–125)
ALT: 10 U/L (ref 10–40)
AST: 15 U/L (ref 14–40)
BILIRUBIN, TOTAL: 0.7 mg/dL

## 2015-06-03 LAB — BASIC METABOLIC PANEL
BUN: 22 mg/dL — AB (ref 4–21)
Creatinine: 1.5 mg/dL — AB (ref <=1.3)
Glucose: 93 mg/dL
Potassium: 4.3 mmol/L (ref 3.4–5.3)
SODIUM: 140 mmol/L (ref 137–147)

## 2015-06-03 LAB — HEMOGLOBIN A1C: Hemoglobin A1C: 5.7

## 2015-06-03 LAB — LIPID PANEL
CHOLESTEROL: 122 mg/dL (ref 0–200)
HDL: 47 mg/dL (ref 35–70)
LDL CALC: 57 mg/dL
TRIGLYCERIDES: 88 mg/dL (ref 40–160)

## 2015-06-04 ENCOUNTER — Encounter: Payer: Self-pay | Admitting: *Deleted

## 2015-06-08 ENCOUNTER — Non-Acute Institutional Stay: Payer: Medicare Other | Admitting: Internal Medicine

## 2015-06-08 ENCOUNTER — Encounter: Payer: Self-pay | Admitting: Internal Medicine

## 2015-06-08 VITALS — BP 120/60 | HR 60 | Temp 97.4°F | Resp 20 | Ht 68.0 in | Wt 153.4 lb

## 2015-06-08 DIAGNOSIS — E785 Hyperlipidemia, unspecified: Secondary | ICD-10-CM

## 2015-06-08 DIAGNOSIS — M2550 Pain in unspecified joint: Secondary | ICD-10-CM | POA: Diagnosis not present

## 2015-06-08 DIAGNOSIS — L299 Pruritus, unspecified: Secondary | ICD-10-CM | POA: Diagnosis not present

## 2015-06-08 DIAGNOSIS — E1151 Type 2 diabetes mellitus with diabetic peripheral angiopathy without gangrene: Secondary | ICD-10-CM

## 2015-06-08 DIAGNOSIS — I10 Essential (primary) hypertension: Secondary | ICD-10-CM | POA: Diagnosis not present

## 2015-06-08 DIAGNOSIS — R002 Palpitations: Secondary | ICD-10-CM | POA: Diagnosis not present

## 2015-06-08 MED ORDER — FINASTERIDE 5 MG PO TABS: ORAL_TABLET | ORAL | Status: DC

## 2015-06-08 NOTE — Progress Notes (Signed)
Patient ID: Eugene Long, male   DOB: 01/08/1918, 82 y.o.   MRN: [id number]    [facility]     Place of Service: Clinic (12)     Allergies  Allergen Reactions  . Codeine Other (See Comments)    Runny eyes and nose,  Teary eyes  . Glyburide Other (See Comments)    Unknown  . Lisinopril Itching  . Uroxatral [Alfuzosin] Other (See Comments)    Heart races  . Epinephrine Palpitations  . Hctz [Hydrochlorothiazide] Rash    Chief Complaint  Patient presents with  . Medical Management of Chronic Issues    4 mo f/u- labs    HPI:  DM (diabetes mellitus), type 2 with peripheral vascular complications ([place]) - controlled  Essential hypertension - controlled  Hyperlipidemia - controlled  Palpitations - resolved  Pruritus -generalized itching related to dry skin. Uses 1% hydrocortisone cream. Worse in the lower back and behind the knees.   Arthralgia - Generalized. Particularly bad in the shoulders and lower back. Worse with cold and rainy weather. Uses Tylenol when needed.    Medications: Patient's Medications  New Prescriptions   No medications on file  Previous Medications   AMLODIPINE (NORVASC) 2.5 MG TABLET    Take one tablet by mouth once daily to control blood pressure   ASPIRIN 81 MG TABLET    Take 81 mg by mouth daily.   CHOLECALCIFEROL (VITAMIN D) 1000 UNITS TABLET    Take 1,000 Units by mouth daily.   GLIMEPIRIDE (AMARYL) 1 MG TABLET    Take One tablet by mouth once daily at breakfast to treat diabetes   IBUPROFEN (ADVIL,MOTRIN) 200 MG TABLET    Take 200 mg by mouth daily as needed for mild pain.    LORATADINE (CLARITIN) 10 MG TABLET    Take one tablet by mouth at bedtime for allergies   METOPROLOL TARTRATE (LOPRESSOR) 25 MG TABLET    Take 1.5 tablets (37.5 mg total) by mouth 2 (two) times daily.   MINERAL OIL LIQUID    Apply 15 mLs to eye daily as needed (for eye).    MULTIPLE VITAMINS-MINERALS (ICAPS PO)    Take 1 capsule by mouth daily.    OMEPRAZOLE (PRILOSEC) 40 MG CAPSULE    Take 1 capsule (40 mg total) by mouth daily.   PRAVASTATIN (PRAVACHOL) 40 MG TABLET    Take one tablet by mouth once daily to control cholesterol   SERTRALINE (ZOLOFT) 50 MG TABLET    Take 1/2 tablet by mouth every 5 days to help anxiety and depression  Modified Medications   Modified Medication Previous Medication   FINASTERIDE (PROSCAR) 5 MG TABLET finasteride (PROSCAR) 5 MG tablet      Take One tablet by mouth once daily to treat enlarged prostate    Take One tablet by mouth once daily to treat enlarged prostate  Discontinued Medications   No medications on file     Review of Systems  Constitutional: Negative for fever, chills, diaphoresis, activity change, appetite change, fatigue and unexpected weight change.  HENT: Positive for hearing loss. Negative for congestion and ear pain.        Significant loss of taste ability  Eyes: Negative for pain.       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  Respiratory: Negative for apnea, cough, choking, chest tightness and shortness of breath.   Cardiovascular: Positive for chest pain (episode on 09/22/13 has resolved). Negative for palpitations  and leg swelling.       Hx right carotid endarterectomy.  Gastrointestinal: Positive for constipation.       Flatulence. History hiatal hernia, GERD, esoph stricture with dilation, and diverticulosis. History colon polyps  Genitourinary:       Increased frequency of urination. Nocturia x3. Slow, weak urinary stream. Urinary hesitation. History of BPH.  Musculoskeletal: Positive for arthralgias (Shoulders and back).  Skin:       Generalized itching. Worse in the back and behind the knees. Uses over-the-counter cortisone cream.  Neurological: Negative for tremors, seizures, syncope, speech difficulty, numbness and headaches.       History TIA  Hematological: Negative.   Psychiatric/Behavioral: Negative.     Filed  Vitals:   06/08/15 0927  BP: 120/60  Pulse: 60  Temp: 97.4 F (36.3 C)  TempSrc: Oral  Resp: 20  Height: 5' 8"  (1.727 m)  Weight: 153 lb 6.4 oz (69.582 kg)  SpO2: 99%   Wt Readings from Last 3 Encounters:  06/08/15 153 lb 6.4 oz (69.582 kg)  02/02/15 152 lb (68.947 kg)  08/11/14 154 lb (69.854 kg)    Body mass index is 23.33 kg/(m^2).  Physical Exam  Constitutional: He is oriented to person, place, and time. He appears well-developed and well-nourished.  HENT:  Head: Normocephalic and atraumatic.  Mouth/Throat: No oropharyngeal exudate.  Loss of hearing. Aid in right ear.  Eyes:  Artificial left eye. Rx lenses.  Neck: Neck supple. No JVD present. No tracheal deviation present. No thyromegaly present.  Cardiovascular: Normal rate, regular rhythm and normal heart sounds.   Pacemaker Diminished PT and DP bilaterally  Pulmonary/Chest: Effort normal and breath sounds normal. He has no wheezes. He has no rales.  Abdominal: Bowel sounds are normal. He exhibits no distension and no mass. There is no tenderness.  Musculoskeletal: Normal range of motion. He exhibits no edema or tenderness.  Lymphadenopathy:    He has no cervical adenopathy.  Neurological: He is alert and oriented to person, place, and time. He displays normal reflexes. No cranial nerve deficit. He exhibits normal muscle tone. Coordination normal.  Skin: Skin is warm and dry.  Psychiatric: He has a normal mood and affect. His behavior is normal. Judgment and thought content normal.     Labs reviewed: Lab Summary Latest Ref Rng 06/03/2015 01/21/2015 07/27/2014 03/23/2014  Hemoglobin 13.0-17.0 g/dL (None) (None) (None) (None)  Hematocrit 39.0-52.0 % (None) (None) (None) (None)  White count - (None) (None) (None) (None)  Platelet count - (None) (None) (None) (None)  Sodium 137 - 147 mmol/L 140 139 136(A) 140  Potassium 3.4 - 5.3 mmol/L 4.3 4.2 4.3 4.4  Calcium - (None) (None) (None) (None)  Phosphorus - (None) (None)  (None) (None)  Creatinine .6 - 1.3 mg/dL 1.5(A) 1.6(A) 1.6(A) 1.6(A)  AST 14 - 40 U/L 15 (None) (None) (None)  Alk Phos 25 - 125 U/L 48 (None) (None) (None)  Bilirubin - (None) (None) (None) (None)  Glucose - 93 92 94 89  Cholesterol 0 - 200 mg/dL 122 141 (None) 118  HDL cholesterol 35 - 70 mg/dL 47 46 (None) 37  Triglycerides 40 - 160 mg/dL 88 93 (None) 110  LDL Direct - (None) (None) (None) (None)  LDL Calc - 57 76 (None) 59  Total protein - (None) (None) (None) (None)  Albumin - (None) (None) (None) (None)   No results found for: TSH Lab Results  Component Value Date   BUN 22* 06/03/2015   BUN 23* 01/21/2015  BUN 25* 07/27/2014   Lab Results  Component Value Date   CREATININE 1.5* 06/03/2015   CREATININE 1.6* 01/21/2015   CREATININE 1.6* 07/27/2014   Lab Results  Component Value Date   HGBA1C 5.7 06/03/2015   HGBA1C 5.7 01/21/2015   HGBA1C 5.8 07/27/2014       Assessment/Plan  1. DM (diabetes mellitus), type 2 with peripheral vascular complications (HCC) -C0I, CMP, future  2. Essential hypertension -CMP, future  3. Hyperlipidemia -Lipids, future  4. Palpitations Improved  5. Pruritus Continue 1% hydrocortisone cream as needed  6. Arthralgia Continue Tylenol as needed

## 2015-06-24 ENCOUNTER — Encounter: Payer: Self-pay | Admitting: Internal Medicine

## 2015-07-27 ENCOUNTER — Other Ambulatory Visit: Payer: Self-pay | Admitting: *Deleted

## 2015-07-27 MED ORDER — GLUCOSE BLOOD VI STRP: ORAL_STRIP | Status: DC

## 2015-08-09 ENCOUNTER — Encounter: Payer: Self-pay | Admitting: *Deleted

## 2015-08-12 ENCOUNTER — Encounter: Payer: Self-pay | Admitting: Internal Medicine

## 2015-08-12 ENCOUNTER — Ambulatory Visit (INDEPENDENT_AMBULATORY_CARE_PROVIDER_SITE_OTHER): Payer: Medicare Other | Admitting: Internal Medicine

## 2015-08-12 VITALS — BP 138/62 | HR 60 | Ht 68.0 in | Wt 152.2 lb

## 2015-08-12 DIAGNOSIS — I442 Atrioventricular block, complete: Secondary | ICD-10-CM | POA: Diagnosis not present

## 2015-08-12 LAB — CUP PACEART INCLINIC DEVICE CHECK
Brady Statistic AP VS Percent: 0 %
Brady Statistic AS VP Percent: 27 %
Date Time Interrogation Session: 20170323140434
Implantable Lead Location: 753859
Implantable Lead Model: 5076
Lead Channel Pacing Threshold Amplitude: 0.75 V
Lead Channel Pacing Threshold Amplitude: 1 V
Lead Channel Pacing Threshold Pulse Width: 0.4 ms
Lead Channel Setting Sensing Sensitivity: 4 mV
MDC IDC LEAD IMPLANT DT: 20050825
MDC IDC LEAD IMPLANT DT: 20050825
MDC IDC LEAD LOCATION: 753860
MDC IDC MSMT BATTERY IMPEDANCE: 159 Ohm
MDC IDC MSMT BATTERY REMAINING LONGEVITY: 109 mo
MDC IDC MSMT BATTERY VOLTAGE: 2.78 V
MDC IDC MSMT LEADCHNL RA IMPEDANCE VALUE: 466 Ohm
MDC IDC MSMT LEADCHNL RA PACING THRESHOLD PULSEWIDTH: 0.4 ms
MDC IDC MSMT LEADCHNL RA SENSING INTR AMPL: 2.8 mV
MDC IDC MSMT LEADCHNL RV IMPEDANCE VALUE: 453 Ohm
MDC IDC SET LEADCHNL RA PACING AMPLITUDE: 2 V
MDC IDC SET LEADCHNL RV PACING AMPLITUDE: 2.75 V
MDC IDC SET LEADCHNL RV PACING PULSEWIDTH: 0.4 ms
MDC IDC STAT BRADY AP VP PERCENT: 73 %
MDC IDC STAT BRADY AS VS PERCENT: 0 %

## 2015-08-12 NOTE — Patient Instructions (Signed)
Medication Instructions:  Your physician recommends that you continue on your current medications as directed. Please refer to the Current Medication list given to you today.   Labwork: None ordered   Testing/Procedures: None ordered   Follow-Up: Your physician wants you to follow-up in: 12 months with Dr Knox Saliva will receive a reminder letter in the mail two months in advance. If you don't receive a letter, please call our office to schedule the follow-up appointment.  Remote monitoring is used to monitor your Pacemaker from home. This monitoring reduces the number of office visits required to check your device to one time per year. It allows [place] to keep an eye on the functioning of your device to ensure it is working properly. You are scheduled for a device check from home on 11/11/15. You may send your transmission at any time that day. If you have a wireless device, the transmission will be sent automatically. After your physician reviews your transmission, you will receive a postcard with your next transmission date.     Any Other Special Instructions Will Be Listed Below (If Applicable).     If you need a refill on your cardiac medications before your next appointment, please call your pharmacy.

## 2015-08-12 NOTE — Progress Notes (Signed)
HPI Mr. Imbert returns today for followup. He is a very pleasant 82 year old man with complete heart block, status post permanent pacemaker insertion. He also is a history of hypertension, diabetes, and dyslipidemia. Despite his advanced age, he continues to do well. He denies chest pain, shortness of breath, or peripheral edema. He has not been in the hospital in the past year.  Allergies  Allergen Reactions  . Codeine Other (See Comments)    Runny eyes and nose,  Teary eyes  . Glyburide Other (See Comments)    Unknown  . Lisinopril Itching  . Uroxatral [Alfuzosin] Other (See Comments)    Heart races  . Epinephrine Palpitations  . Hctz [Hydrochlorothiazide] Rash     Current Outpatient Prescriptions  Medication Sig Dispense Refill  . amLODipine (NORVASC) 2.5 MG tablet Take one tablet by mouth once daily to control blood pressure 90 tablet 3  . aspirin 81 MG tablet Take 81 mg by mouth daily.    . cholecalciferol (VITAMIN D) 1000 UNITS tablet Take 1,000 Units by mouth daily.    . finasteride (PROSCAR) 5 MG tablet Take One tablet by mouth once daily to treat enlarged prostate 90 tablet 3  . glimepiride (AMARYL) 1 MG tablet Take One tablet by mouth once daily at breakfast to treat diabetes 90 tablet 3  . glucose blood test strip Check blood sugar once daily. 100 each 12  . ibuprofen (ADVIL,MOTRIN) 200 MG tablet Take 200 mg by mouth daily as needed for mild pain.     Marland Kitchen loratadine (CLARITIN) 10 MG tablet Take one tablet by mouth at bedtime for allergies    . metoprolol tartrate (LOPRESSOR) 25 MG tablet Take 1.5 tablets (37.5 mg total) by mouth 2 (two) times daily. 270 tablet 3  . mineral oil liquid Apply 15 mLs to eye daily as needed (for eye).     . Multiple Vitamins-Minerals (ICAPS PO) Take 1 capsule by mouth daily.     Marland Kitchen omeprazole (PRILOSEC) 40 MG capsule Take 1 capsule (40 mg total) by mouth daily. 90 capsule 3  . pravastatin (PRAVACHOL) 40 MG tablet Take one tablet by mouth once daily to  control cholesterol 90 tablet 3  . sertraline (ZOLOFT) 50 MG tablet Take 1/2 tablet by mouth every 5 days to help anxiety and depression 45 tablet 3   No current facility-administered medications for this visit.     Past Medical History  Diagnosis Date  . Benign neoplasm of colon     Polyp  . Depressive disorder, not elsewhere classified   . Osteoarthrosis, unspecified whether generalized or localized, unspecified site   . Complete rupture of rotator cuff   . Peripheral vascular disease, unspecified ([place])   . Acute venous embolism and thrombosis of unspecified deep vessels of lower extremity   . Esophageal reflux   . Other and unspecified hyperlipidemia   . Diabetes mellitus ([place])   . BPH (benign prostatic hypertrophy)   . Diverticulosis     colon, Hx of  . Hypertension   . 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 Anxiety   . Unspecified hearing loss   . Coronary atherosclerosis of native coronary artery   . Atrioventricular block, complete ([place])   . Stricture and stenosis of esophagus   . Diaphragmatic hernia without mention of obstruction or gangrene   . Diverticulosis of colon (without mention of hemorrhage)   . Unspecified constipation   . Hypertrophy of prostate with urinary obstruction  and other lower urinary tract symptoms (LUTS)   . Other specified disorder of male genital organs(608.89)     right epididymal cyst  . Unspecified pruritic disorder   . Actinic keratosis   . Lumbago   . Contracture of palmar fascia   . Insomnia, unspecified   . Dysphagia, unspecified(787.20)   . Chronic kidney disease, stage II (mild) 12/10/2012    ROS:   All systems reviewed and negative except as noted in the HPI.   Past Surgical History  Procedure Laterality Date  . Knee arthroscopy Left 1993  . Appendectomy  1942  . Pacemaker insertion  2005; 08/04/2013    Dual chamber MDT pacemaker implanted by Dr Lovena Le in 2005 for CHB; gen change by Dr  Lovena Le 07/2013 - MDT ADDRL1   . Tonsillectomy  1931  . Arthoscopy right knee  1980  . Carotid endarterectomy  1987    right  . Cataract extraction  [id number]  . 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 Colonoscopy    . Colonoscopy  2005    Dr. Sharlett Iles  . Pacemaker generator change  08/04/13    Dr. Lovena Le  . Permanent pacemaker generator change N/A 08/04/2013    Procedure: PERMANENT PACEMAKER GENERATOR CHANGE;  Surgeon: Evans Lance, MD;  Location: [facility] CATH LAB;  Service: Cardiovascular;  Laterality: N/A;     Family History  Problem Relation Age of Onset  . Liver cancer Father   . Pancreatic cancer Father   . Heart disease Sister     CHF  . Cancer Father   . Diabetes Paternal Uncle      Social History   Social History  . Marital Status: Married    Spouse Name: N/A  . Number of Children: 3  . Years of Education: N/A   Occupational History  . retired Teacher, English as a foreign language    Social History Main Topics  . Smoking status: Former Smoker -- 0.50 packs/day for 17 years    Types: Cigarettes    Quit date: 05/22/1960  . Smokeless tobacco: Never Used  . Alcohol Use: No  . Drug Use: No  . Sexual Activity: No   Other Topics Concern  . Not on file   Social History Narrative   Patient lives at [facility] since 2003.   Widowed   Living will   In [place]   Pacemaker   Previously worked in Research officer, trade [employer]   Exercise walks 15-20 minutes twice a day most days     BP 138/62 mmHg  Pulse 60  Ht 5\' 8"  (1.727 m)  Wt 152 lb 3.2 oz (69.037 kg)  BMI 23.15 kg/m2  Physical Exam:  Well appearing elderly man, looks younger than his stated age, NAD HEENT: Unremarkable Neck:  7 cm JVD, no thyromegally Lungs:  Clear with no wheezes, rales, or rhonchi. Well-healed pacemaker incision. HEART:  Regular rate rhythm, no murmurs, no rubs, no clicks Abd:  soft, positive bowel sounds, no organomegally, no rebound, no guarding Ext:  2 plus pulses, no  edema, no cyanosis, no clubbing Skin:  No rashes no nodules Neuro:  CN II through XII intact, motor grossly intact  ECG - NSR with AV pacing  DEVICE  Normal device function.  See PaceArt for details.   Assess/Plan: 1. Complete heart block - he is doing well, s/p PPM insertion 2. HTN - his blood pressure is reasonably well controlled. Will follow. 3. PM - his medtronic DDD PM  is working normally. Will recheck in several months.  Mikle Bosworth.D.

## 2015-09-28 ENCOUNTER — Other Ambulatory Visit: Payer: Self-pay

## 2015-09-28 MED ORDER — PRAVASTATIN SODIUM 40 MG PO TABS: ORAL_TABLET | ORAL | Status: DC

## 2015-09-28 NOTE — Telephone Encounter (Signed)
Patient came by [facility] clinic to get Rx for Pravastatin 40 mg mail order, e- prescribe.

## 2015-10-26 ENCOUNTER — Other Ambulatory Visit: Payer: Self-pay

## 2015-10-26 MED ORDER — GLIMEPIRIDE 1 MG PO TABS: ORAL_TABLET | ORAL | Status: DC

## 2015-10-26 NOTE — Telephone Encounter (Signed)
Patient came by [facility] clinic and asked for refill for Glimepiride 1 mg one daily. Faxed.

## 2015-11-11 ENCOUNTER — Ambulatory Visit (INDEPENDENT_AMBULATORY_CARE_PROVIDER_SITE_OTHER): Payer: Medicare Other | Admitting: *Deleted

## 2015-11-11 DIAGNOSIS — I442 Atrioventricular block, complete: Secondary | ICD-10-CM | POA: Diagnosis not present

## 2015-11-11 LAB — CUP PACEART REMOTE DEVICE CHECK
Battery Voltage: 2.77 V
Brady Statistic AP VS Percent: 0 %
Brady Statistic AS VP Percent: 23 %
Brady Statistic AS VS Percent: 0 %
Date Time Interrogation Session: 20170622105434
Implantable Lead Location: 753859
Implantable Lead Model: 5076
Implantable Lead Model: 5076
Lead Channel Impedance Value: 467 Ohm
Lead Channel Pacing Threshold Amplitude: 0.875 V
Lead Channel Pacing Threshold Amplitude: 1.75 V
Lead Channel Pacing Threshold Pulse Width: 0.4 ms
Lead Channel Pacing Threshold Pulse Width: 0.4 ms
Lead Channel Setting Sensing Sensitivity: 4 mV
MDC IDC LEAD IMPLANT DT: 20050825
MDC IDC LEAD IMPLANT DT: 20050825
MDC IDC LEAD LOCATION: 753860
MDC IDC MSMT BATTERY IMPEDANCE: 183 Ohm
MDC IDC MSMT BATTERY REMAINING LONGEVITY: 97 mo
MDC IDC MSMT LEADCHNL RA IMPEDANCE VALUE: 466 Ohm
MDC IDC MSMT LEADCHNL RA SENSING INTR AMPL: 2.8 mV
MDC IDC SET LEADCHNL RA PACING AMPLITUDE: 2 V
MDC IDC SET LEADCHNL RV PACING AMPLITUDE: 3.5 V
MDC IDC SET LEADCHNL RV PACING PULSEWIDTH: 0.4 ms
MDC IDC STAT BRADY AP VP PERCENT: 77 %

## 2015-11-11 NOTE — Progress Notes (Signed)
Remote pacemaker transmission.   

## 2015-12-02 LAB — BASIC METABOLIC PANEL
BUN: 23 mg/dL — AB (ref 4–21)
Creatinine: 1.6 mg/dL — AB (ref 0.6–1.3)
Glucose: 79 mg/dL
Potassium: 4.4 mmol/L (ref 3.4–5.3)
Sodium: 139 mmol/L (ref 137–147)

## 2015-12-02 LAB — HEMOGLOBIN A1C: HEMOGLOBIN A1C: 5.7

## 2015-12-02 LAB — LIPID PANEL
CHOLESTEROL: 119 mg/dL (ref 0–200)
HDL: 50 mg/dL (ref 35–70)
LDL CALC: 52 mg/dL
TRIGLYCERIDES: 85 mg/dL (ref 40–160)

## 2015-12-06 ENCOUNTER — Encounter: Payer: Self-pay | Admitting: *Deleted

## 2015-12-07 ENCOUNTER — Encounter: Payer: Self-pay | Admitting: Internal Medicine

## 2015-12-07 ENCOUNTER — Non-Acute Institutional Stay: Payer: Medicare Other | Admitting: Internal Medicine

## 2015-12-07 VITALS — BP 126/78 | HR 60 | Temp 97.3°F | Ht 68.0 in | Wt 146.0 lb

## 2015-12-07 DIAGNOSIS — H3530 Unspecified macular degeneration: Secondary | ICD-10-CM | POA: Diagnosis not present

## 2015-12-07 DIAGNOSIS — E1151 Type 2 diabetes mellitus with diabetic peripheral angiopathy without gangrene: Secondary | ICD-10-CM | POA: Diagnosis not present

## 2015-12-07 DIAGNOSIS — E785 Hyperlipidemia, unspecified: Secondary | ICD-10-CM

## 2015-12-07 DIAGNOSIS — N182 Chronic kidney disease, stage 2 (mild): Secondary | ICD-10-CM

## 2015-12-07 DIAGNOSIS — C439 Malignant melanoma of skin, unspecified: Secondary | ICD-10-CM

## 2015-12-07 DIAGNOSIS — I10 Essential (primary) hypertension: Secondary | ICD-10-CM

## 2015-12-07 MED ORDER — PRAVASTATIN SODIUM 20 MG PO TABS: ORAL_TABLET | ORAL | Status: DC

## 2015-12-07 NOTE — Progress Notes (Signed)
Patient ID: Eugene Long, male   DOB: Dec 29, 1917, 82 y.o.   MRN: [id number]    Facility      Place of Service: [facility] (12)     Allergies  Allergen Reactions  . Codeine Other (See Comments)    Runny eyes and nose,  Teary eyes  . Glyburide Other (See Comments)    Unknown  . Lisinopril Itching  . Uroxatral [Alfuzosin] Other (See Comments)    Heart races  . Epinephrine Palpitations  . Hctz [Hydrochlorothiazide] Rash    Chief Complaint  Patient presents with  . 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    Medications: Patient's Medications  New Prescriptions   No medications on file  Previous Medications   AMLODIPINE (NORVASC) 2.5 MG TABLET    Take one tablet by mouth once daily to control blood pressure   ASPIRIN 81 MG TABLET    Take 81 mg by mouth daily.   CHOLECALCIFEROL (VITAMIN D) 1000 UNITS TABLET    Take 1,000 Units by mouth daily.   FINASTERIDE (PROSCAR) 5 MG TABLET    Take One tablet by mouth once daily to treat enlarged prostate   GLIMEPIRIDE (AMARYL) 1 MG TABLET    Take One tablet by mouth once daily at breakfast to treat diabetes   GLUCOSE BLOOD TEST STRIP    Check blood sugar once daily.   IBUPROFEN (ADVIL,MOTRIN) 200 MG TABLET    Take 200 mg by mouth daily as needed for mild pain.    LORATADINE (CLARITIN) 10 MG TABLET    Take one tablet by mouth at bedtime for allergies   METOPROLOL TARTRATE (LOPRESSOR) 25 MG TABLET    Take 1.5 tablets (37.5 mg total) by mouth 2 (two) times daily.   MINERAL OIL LIQUID     Apply 15 mLs to eye daily as needed (for eye).    MULTIPLE VITAMINS-MINERALS (ICAPS PO)    Take 1 capsule by mouth daily.    OMEPRAZOLE (PRILOSEC) 40 MG CAPSULE    Take 1 capsule (40 mg total) by mouth daily.   PRAVASTATIN (PRAVACHOL) 40 MG TABLET    Take one tablet by mouth once daily to control cholesterol   SERTRALINE (ZOLOFT) 50 MG TABLET    Take 1/2 tablet by mouth every 5 days to help anxiety and depression  Modified Medications   No medications on file  Discontinued Medications   No medications on file     Review of Systems  Constitutional: Negative for fever, chills, diaphoresis, activity change, appetite change, fatigue and unexpected weight change.  HENT: Positive for hearing loss. Negative for congestion and ear pain.        Significant loss of taste ability  Eyes: Negative for pain.       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  Respiratory: Negative for apnea, cough, choking, chest tightness and shortness of breath.   Cardiovascular: Positive for chest pain (episode on 09/22/13 has resolved). Negative for palpitations and leg swelling.  Hx right carotid endarterectomy.  Gastrointestinal: Positive for constipation.       Flatulence. History hiatal hernia, GERD, esoph stricture with dilation, and diverticulosis. History colon polyps  Genitourinary:       Increased frequency of urination. Nocturia x3. Slow, weak urinary stream. Urinary hesitation. History of BPH.  Musculoskeletal: Positive for arthralgias (Shoulders and back).  Skin:       Generalized itching. Worse in the back and behind the knees. Uses over-the-counter cortisone cream.  Neurological: Negative for tremors, seizures, syncope, speech difficulty, numbness and headaches.       History TIA  Hematological: Negative.   Psychiatric/Behavioral: Negative.     Filed Vitals:   12/07/15 0929  BP: 126/78  Pulse: 60  Temp: 97.3 F (36.3 C)  TempSrc: Oral    Height: 5' 8"  (1.727 m)  Weight: 146 lb (66.225 kg)  SpO2: 99%   Wt Readings from Last 3 Encounters:  12/07/15 146 lb (66.225 kg)  08/12/15 152 lb 3.2 oz (69.037 kg)  06/08/15 153 lb 6.4 oz (69.582 kg)    Body mass index is 22.2 kg/(m^2).  Physical Exam  Constitutional: He is oriented to person, place, and time. He appears well-developed and well-nourished.  HENT:  Head: Normocephalic and atraumatic.  Mouth/Throat: No oropharyngeal exudate.  Loss of hearing. Aid in right ear.  Eyes:  Artificial left eye. Rx lenses.  Neck: Neck supple. No JVD present. No tracheal deviation present. No thyromegaly present.  Cardiovascular: Normal rate, regular rhythm and normal heart sounds.   Pacemaker Diminished PT and DP bilaterally  Pulmonary/Chest: Effort normal and breath sounds normal. He has no wheezes. He has no rales.  Abdominal: Bowel sounds are normal. He exhibits no distension and no mass. There is no tenderness.  Musculoskeletal: Normal range of motion. He exhibits no edema or tenderness.  Lymphadenopathy:    He has no cervical adenopathy.  Neurological: He is alert and oriented to person, place, and time. He displays normal reflexes. No cranial nerve deficit. He exhibits normal muscle tone. Coordination normal.  Skin: Skin is warm and dry.  Psychiatric: He has a normal mood and affect. His behavior is normal. Judgment and thought content normal.     Labs reviewed: Lab Summary Latest Ref Rng 12/02/2015 06/03/2015 01/21/2015 07/27/2014  Hemoglobin 13.0-17.0 g/dL (None) (None) (None) (None)  Hematocrit 39.0-52.0 % (None) (None) (None) (None)  White count - (None) (None) (None) (None)  Platelet count - (None) (None) (None) (None)  Sodium 137 - 147 mmol/L 139 140 139 136(A)  Potassium 3.4 - 5.3 mmol/L 4.4 4.3 4.2 4.3  Calcium - (None) (None) (None) (None)  Phosphorus - (None) (None) (None) (None)  Creatinine 0.6 - 1.3 mg/dL 1.6(A) 1.5(A) 1.6(A) 1.6(A)  AST 14 - 40 U/L (None) 15  (None) (None)  Alk Phos 25 - 125 U/L (None) 48 (None) (None)  Bilirubin - (None) (None) (None) (None)  Glucose - 79 93 92 94  Cholesterol 0 - 200 mg/dL 119 122 141 (None)  HDL cholesterol 35 - 70 mg/dL 50 47 46 (None)  Triglycerides 40 - 160 mg/dL 85 88 93 (None)  LDL Direct - (None) (None) (None) (None)  LDL Calc - 52 57 76 (None)  Total protein - (None) (None) (None) (None)  Albumin - (None) (None) (None) (None)   No results found for: TSH Lab Results  Component Value Date   BUN 23* 12/02/2015   BUN 22* 06/03/2015   BUN 23* 01/21/2015   Lab Results  Component Value Date  CREATININE 1.6* 12/02/2015   CREATININE 1.5* 06/03/2015   CREATININE 1.6* 01/21/2015   Lab Results  Component Value Date   HGBA1C 5.7 12/02/2015   HGBA1C 5.7 06/03/2015   HGBA1C 5.7 01/21/2015       Assessment/Plan 1. Essential hypertension controlled  2. DM (diabetes mellitus), type 2 with peripheral vascular complications (HCC) controlled  3. Hyperlipidemia Patient was to cut back on the pravastatin due to leg cramps. -Reduce pravastatin 20 mg nightly  4. Chronic kidney disease, stage II (mild) Stable  5. Macular degeneration Patient anticipates he will have to quit driving  6. Melanoma of skin ([place]) No relapse of the melanoma of the eye.

## 2015-12-22 LAB — HM DIABETES EYE EXAM

## 2015-12-31 ENCOUNTER — Encounter: Payer: Self-pay | Admitting: Internal Medicine

## 2016-02-10 ENCOUNTER — Telehealth: Payer: Self-pay | Admitting: Cardiology

## 2016-02-10 ENCOUNTER — Ambulatory Visit (INDEPENDENT_AMBULATORY_CARE_PROVIDER_SITE_OTHER): Payer: Medicare Other | Admitting: *Deleted

## 2016-02-10 DIAGNOSIS — I442 Atrioventricular block, complete: Secondary | ICD-10-CM | POA: Diagnosis not present

## 2016-02-10 NOTE — Telephone Encounter (Signed)
LMOVM reminding pt to send remote transmission.   

## 2016-02-10 NOTE — Progress Notes (Signed)
Remote pacemaker transmission.   

## 2016-02-11 ENCOUNTER — Encounter: Payer: Self-pay | Admitting: Cardiology

## 2016-02-13 ENCOUNTER — Other Ambulatory Visit: Payer: Self-pay | Admitting: Internal Medicine

## 2016-03-02 LAB — CUP PACEART REMOTE DEVICE CHECK
Battery Remaining Longevity: 93 mo
Brady Statistic AS VS Percent: 0 %
Date Time Interrogation Session: 20170921160611
Implantable Lead Implant Date: 20050825
Implantable Lead Location: 753859
Implantable Lead Location: 753860
Lead Channel Impedance Value: 458 Ohm
Lead Channel Pacing Threshold Amplitude: 1.75 V
Lead Channel Setting Pacing Amplitude: 2 V
Lead Channel Setting Pacing Pulse Width: 0.4 ms
MDC IDC LEAD IMPLANT DT: 20050825
MDC IDC MSMT BATTERY IMPEDANCE: 207 Ohm
MDC IDC MSMT BATTERY VOLTAGE: 2.77 V
MDC IDC MSMT LEADCHNL RA IMPEDANCE VALUE: 460 Ohm
MDC IDC MSMT LEADCHNL RA PACING THRESHOLD AMPLITUDE: 1 V
MDC IDC MSMT LEADCHNL RA PACING THRESHOLD PULSEWIDTH: 0.4 ms
MDC IDC MSMT LEADCHNL RA SENSING INTR AMPL: 2.8 mV
MDC IDC MSMT LEADCHNL RV PACING THRESHOLD PULSEWIDTH: 0.4 ms
MDC IDC SET LEADCHNL RV PACING AMPLITUDE: 3.5 V
MDC IDC SET LEADCHNL RV SENSING SENSITIVITY: 4 mV
MDC IDC STAT BRADY AP VP PERCENT: 76 %
MDC IDC STAT BRADY AP VS PERCENT: 0 %
MDC IDC STAT BRADY AS VP PERCENT: 24 %

## 2016-03-14 ENCOUNTER — Other Ambulatory Visit: Payer: Self-pay

## 2016-03-14 DIAGNOSIS — N182 Chronic kidney disease, stage 2 (mild): Secondary | ICD-10-CM

## 2016-03-14 DIAGNOSIS — E1151 Type 2 diabetes mellitus with diabetic peripheral angiopathy without gangrene: Secondary | ICD-10-CM

## 2016-03-14 DIAGNOSIS — I10 Essential (primary) hypertension: Secondary | ICD-10-CM

## 2016-03-14 DIAGNOSIS — E785 Hyperlipidemia, unspecified: Secondary | ICD-10-CM

## 2016-03-15 ENCOUNTER — Other Ambulatory Visit: Payer: Self-pay

## 2016-03-15 DIAGNOSIS — E1151 Type 2 diabetes mellitus with diabetic peripheral angiopathy without gangrene: Secondary | ICD-10-CM

## 2016-03-15 DIAGNOSIS — I10 Essential (primary) hypertension: Secondary | ICD-10-CM

## 2016-03-23 ENCOUNTER — Other Ambulatory Visit: Payer: Self-pay | Admitting: Internal Medicine

## 2016-04-03 ENCOUNTER — Other Ambulatory Visit: Payer: Self-pay | Admitting: Internal Medicine

## 2016-04-03 LAB — MICROALBUMIN / CREATININE URINE RATIO
Creatinine, Urine: 72 mg/dL (ref 20–370)
Microalb, Ur: <0.2 mg/dL

## 2016-04-03 LAB — COMPLETE METABOLIC PANEL WITH GFR
ALT: 9 U/L (ref 9–46)
AST: 16 U/L (ref 10–35)
Albumin: 4 g/dL (ref 3.6–5.1)
Alkaline Phosphatase: 59 U/L (ref 40–115)
BUN: 26 mg/dL — AB (ref 7–25)
CHLORIDE: 103 mmol/L (ref 98–110)
CO2: 30 mmol/L (ref 20–31)
Calcium: 9.2 mg/dL (ref 8.6–10.3)
Creat: 1.63 mg/dL — ABNORMAL HIGH (ref 0.70–1.11)
GFR, Est African American: 41 mL/min — ABNORMAL LOW (ref >=60)
GFR, Est Non African American: 35 mL/min — ABNORMAL LOW (ref >=60)
GLUCOSE: 95 mg/dL (ref 65–99)
POTASSIUM: 4.3 mmol/L (ref 3.5–5.3)
SODIUM: 139 mmol/L (ref 135–146)
Total Bilirubin: 0.6 mg/dL (ref 0.2–1.2)
Total Protein: 6.1 g/dL (ref 6.1–8.1)

## 2016-04-03 LAB — HEMOGLOBIN A1C
HEMOGLOBIN A1C: 5.3 % (ref <5.7)
MEAN PLASMA GLUCOSE: 105 mg/dL

## 2016-04-11 ENCOUNTER — Encounter: Payer: Self-pay | Admitting: Internal Medicine

## 2016-04-11 ENCOUNTER — Non-Acute Institutional Stay: Payer: Medicare Other | Admitting: Internal Medicine

## 2016-04-11 VITALS — BP 132/60 | HR 59 | Temp 97.6°F | Ht 68.0 in | Wt 147.0 lb

## 2016-04-11 DIAGNOSIS — N182 Chronic kidney disease, stage 2 (mild): Secondary | ICD-10-CM | POA: Diagnosis not present

## 2016-04-11 DIAGNOSIS — K5900 Constipation, unspecified: Secondary | ICD-10-CM | POA: Insufficient documentation

## 2016-04-11 DIAGNOSIS — I10 Essential (primary) hypertension: Secondary | ICD-10-CM | POA: Diagnosis not present

## 2016-04-11 DIAGNOSIS — E1151 Type 2 diabetes mellitus with diabetic peripheral angiopathy without gangrene: Secondary | ICD-10-CM | POA: Diagnosis not present

## 2016-04-11 DIAGNOSIS — K5901 Slow transit constipation: Secondary | ICD-10-CM

## 2016-04-11 DIAGNOSIS — E1122 Type 2 diabetes mellitus with diabetic chronic kidney disease: Secondary | ICD-10-CM | POA: Diagnosis not present

## 2016-04-11 DIAGNOSIS — E785 Hyperlipidemia, unspecified: Secondary | ICD-10-CM

## 2016-04-11 DIAGNOSIS — R252 Cramp and spasm: Secondary | ICD-10-CM | POA: Diagnosis not present

## 2016-04-11 MED ORDER — MAGNESIUM OXIDE 400 MG PO TABS: ORAL_TABLET | ORAL | 4 refills | Status: DC

## 2016-04-11 MED ORDER — LOSARTAN POTASSIUM 50 MG PO TABS: ORAL_TABLET | ORAL | 4 refills | Status: DC

## 2016-04-11 NOTE — Progress Notes (Signed)
Facility  [facility]    Place of Service: Clinic (12)     Allergies  Allergen Reactions  . Codeine Other (See Comments)    Runny eyes and nose,  Teary eyes  . Glyburide Other (See Comments)    Unknown  . Lisinopril Itching  . Uroxatral [Alfuzosin] Other (See Comments)    Heart races  . Epinephrine Palpitations  . Hctz [Hydrochlorothiazide] Rash    Chief Complaint  Patient presents with  . Medical Management of Chronic Issues    4 month medication management blood sugar, blood pressure, cholesterol, review labs. Checks blood sugar once a day, in evening. Last night it was 114.   . Medication Management    letter from [facility] suggest an ACE inhibitor for him because of diabetes.   . Knee Problem    left knee pops  . legs    restless legs at night, keeps him awake    HPI:  DM (diabetes mellitus), type 2 with peripheral vascular complications ([place]) - controlled  Type 2 diabetes mellitus with stage 2 chronic kidney disease, without long-term current use of insulin ([place]) - controlled  Essential hypertension - controlled. Should be able to stop amlodipine when he starts losartan.  Hyperlipidemia, unspecified hyperlipidemia type - follow up needed  Slow transit constipation - using glycerin suppositories  Leg cramp - bilateral and nightly    Medications: Patient's Medications  New Prescriptions   No medications on file  Previous Medications   AMLODIPINE (NORVASC) 2.5 MG TABLET    TAKE 1 TABLET DAILY TO CONTROL BLOOD PRESSURE   ASPIRIN 81 MG TABLET    Take 81 mg by mouth daily.   CHOLECALCIFEROL (VITAMIN D) 1000 UNITS TABLET    Take 1,000 Units by mouth daily.   FINASTERIDE (PROSCAR) 5 MG TABLET    Take One tablet by mouth once daily to treat enlarged prostate   GLIMEPIRIDE (AMARYL) 1 MG TABLET    Take One tablet by mouth once daily at breakfast to treat diabetes   GLUCOSE BLOOD TEST STRIP    Check blood sugar once daily.   IBUPROFEN (ADVIL,MOTRIN) 200 MG  TABLET    Take 200 mg by mouth daily as needed for mild pain.    LORATADINE (CLARITIN) 10 MG TABLET    Take one tablet by mouth at bedtime for allergies   METOPROLOL TARTRATE (LOPRESSOR) 25 MG TABLET    TAKE ONE AND ONE-HALF TABLETS TWICE A DAY   MINERAL OIL LIQUID    Apply 15 mLs to eye daily as needed (for eye).    MULTIPLE VITAMINS-MINERALS (ICAPS PO)    Take 1 capsule by mouth daily.    OMEPRAZOLE (PRILOSEC) 40 MG CAPSULE    TAKE 1 CAPSULE DAILY   PRAVASTATIN (PRAVACHOL) 20 MG TABLET    One nightly to lower cholesterol   SERTRALINE (ZOLOFT) 50 MG TABLET    Take 1/2 tablet by mouth every 5 days to help anxiety and depression  Modified Medications   No medications on file  Discontinued Medications   No medications on file     Review of Systems  Constitutional: Negative for activity change, appetite change, chills, diaphoresis, fatigue, fever and unexpected weight change.  HENT: Positive for hearing loss. Negative for congestion and ear pain.        Significant loss of taste ability  Eyes: Negative for pain.       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  Respiratory: Negative  for apnea, cough, choking, chest tightness and shortness of breath.   Cardiovascular: Positive for chest pain (episode on 09/22/13 has resolved). Negative for palpitations and leg swelling.       Hx right carotid endarterectomy.  Gastrointestinal: Positive for constipation.       Flatulence. History hiatal hernia, GERD, esoph stricture with dilation, and diverticulosis. History colon polyps  Genitourinary:       Increased frequency of urination. Nocturia x3. Slow, weak urinary stream. Urinary hesitation. History of BPH.  Musculoskeletal: Positive for arthralgias (Shoulders and back).       Bilateral leg cramps. Snappin of a tendon over a boney prominence at the medial left knee. Denies pain or any impact on walking.  Skin:       Generalized itching. Worse in the  back and behind the knees. Uses over-the-counter cortisone cream.  Neurological: Negative for tremors, seizures, syncope, speech difficulty, numbness and headaches.       History TIA  Hematological: Negative.   Psychiatric/Behavioral: Negative.     Vitals:   04/11/16 0847  BP: 132/60  Pulse: (!) 59  Temp: 97.6 F (36.4 C)  TempSrc: Oral  SpO2: 99%  Weight: 147 lb (66.7 kg)  Height: 5' 8"  (1.727 m)   Wt Readings from Last 3 Encounters:  04/11/16 147 lb (66.7 kg)  12/07/15 146 lb (66.2 kg)  08/12/15 152 lb 3.2 oz (69 kg)    Body mass index is 22.35 kg/m.  Physical Exam  Constitutional: He is oriented to person, place, and time. He appears well-developed and well-nourished.  HENT:  Head: Normocephalic and atraumatic.  Mouth/Throat: No oropharyngeal exudate.  Loss of hearing. Aid in right ear.  Eyes:  Artificial left eye. Rx lenses.  Neck: Neck supple. No JVD present. No tracheal deviation present. No thyromegaly present.  Cardiovascular: Normal rate, regular rhythm and normal heart sounds.   Pacemaker Diminished PT and DP bilaterally  Pulmonary/Chest: Effort normal and breath sounds normal. He has no wheezes. He has no rales.  Abdominal: Bowel sounds are normal. He exhibits no distension and no mass. There is no tenderness.  Musculoskeletal: Normal range of motion. He exhibits no edema or tenderness.  Snapping at the left medial knee where the tendoin passes over a bony prominence  Lymphadenopathy:    He has no cervical adenopathy.  Neurological: He is alert and oriented to person, place, and time. He displays normal reflexes. No cranial nerve deficit. He exhibits normal muscle tone. Coordination normal.  Skin: Skin is warm and dry.  Psychiatric: He has a normal mood and affect. His behavior is normal. Judgment and thought content normal.     Labs reviewed: Lab Summary Latest Ref Rng & Units 04/03/2016 12/02/2015 06/03/2015 01/21/2015  Hemoglobin 13.0-17.0 g/dL (None)  (None) (None) (None)  Hematocrit 39.0-52.0 % (None) (None) (None) (None)  White count - (None) (None) (None) (None)  Platelet count - (None) (None) (None) (None)  Sodium 135 - 146 mmol/L 139 139 140 139  Potassium 3.5 - 5.3 mmol/L 4.3 4.4 4.3 4.2  Calcium 8.6 - 10.3 mg/dL 9.2 (None) (None) (None)  Phosphorus - (None) (None) (None) (None)  Creatinine 0.70 - 1.11 mg/dL 1.63(H) 1.6(A) 1.5(A) 1.6(A)  AST 10 - 35 U/L 16 (None) 15 (None)  Alk Phos 40 - 115 U/L 59 (None) 48 (None)  Bilirubin 0.2 - 1.2 mg/dL 0.6 (None) (None) (None)  Glucose 65 - 99 mg/dL 95 79 93 92  Cholesterol 0 - 200 mg/dL (None) 119 122 141  HDL  cholesterol 35 - 70 mg/dL (None) 50 47 46  Triglycerides 40 - 160 mg/dL (None) 85 88 93  LDL Direct - (None) (None) (None) (None)  LDL Calc mg/dL (None) 52 57 76  Total protein 6.1 - 8.1 g/dL 6.1 (None) (None) (None)  Albumin 3.6 - 5.1 g/dL 4.0 (None) (None) (None)  Some recent data might be hidden   No results found for: TSH Lab Results  Component Value Date   BUN 26 (H) 04/03/2016   BUN 23 (A) 12/02/2015   BUN 22 (A) 06/03/2015   Lab Results  Component Value Date   CREATININE 1.63 (H) 04/03/2016   CREATININE 1.6 (A) 12/02/2015   CREATININE 1.5 (A) 06/03/2015   Lab Results  Component Value Date   HGBA1C 5.3 04/03/2016   HGBA1C 5.7 12/02/2015   HGBA1C 5.7 06/03/2015       Assessment/Plan  1. DM (diabetes mellitus), type 2 with peripheral vascular complications (HCC) controlled  2. Type 2 diabetes mellitus with stage 2 chronic kidney disease, without long-term current use of insulin (HCC) controlled - Hemoglobin A1c; Future - Basic metabolic panel; Future  3. Essential hypertension controlled - losartan (COZAAR) 50 MG tablet; One daily to control BP and to help protect the kidneys  Dispense: 90 tablet; Refill: 4 - Basic metabolic panel; Future  4. Hyperlipidemia, unspecified hyperlipidemia - Lipid panel; Future  5. Slow transit constipation Try  magnesium oxide  6. Leg cramp - magnesium oxide (MAG-OX) 400 MG tablet; One daily to help leg cramps  Dispense: 90 tablet; Refill: 4

## 2016-05-06 ENCOUNTER — Other Ambulatory Visit: Payer: Self-pay | Admitting: Internal Medicine

## 2016-05-06 DIAGNOSIS — F329 Major depressive disorder, single episode, unspecified: Secondary | ICD-10-CM

## 2016-05-06 DIAGNOSIS — F32A Depression, unspecified: Secondary | ICD-10-CM

## 2016-05-11 ENCOUNTER — Ambulatory Visit (INDEPENDENT_AMBULATORY_CARE_PROVIDER_SITE_OTHER): Payer: Medicare Other | Admitting: *Deleted

## 2016-05-11 DIAGNOSIS — I442 Atrioventricular block, complete: Secondary | ICD-10-CM

## 2016-05-11 NOTE — Progress Notes (Signed)
Remote pacemaker transmission.   

## 2016-05-12 LAB — CUP PACEART REMOTE DEVICE CHECK
Battery Impedance: 232 Ohm
Battery Remaining Longevity: 113 mo
Battery Voltage: 2.78 V
Date Time Interrogation Session: 20171221140824
Implantable Lead Location: 753860
Implantable Lead Model: 5076
Lead Channel Setting Pacing Amplitude: 2 V
Lead Channel Setting Pacing Pulse Width: 0.4 ms
Lead Channel Setting Sensing Sensitivity: 4 mV
MDC IDC LEAD IMPLANT DT: 20050825
MDC IDC LEAD IMPLANT DT: 20050825
MDC IDC LEAD LOCATION: 753859
MDC IDC MSMT LEADCHNL RA IMPEDANCE VALUE: 480 Ohm
MDC IDC MSMT LEADCHNL RA PACING THRESHOLD AMPLITUDE: 0.875 V
MDC IDC MSMT LEADCHNL RA PACING THRESHOLD PULSEWIDTH: 0.4 ms
MDC IDC MSMT LEADCHNL RV IMPEDANCE VALUE: 452 Ohm
MDC IDC MSMT LEADCHNL RV PACING THRESHOLD AMPLITUDE: 1 V
MDC IDC MSMT LEADCHNL RV PACING THRESHOLD PULSEWIDTH: 0.4 ms
MDC IDC PG IMPLANT DT: 20150316
MDC IDC SET LEADCHNL RA PACING AMPLITUDE: 2 V
MDC IDC STAT BRADY AP VP PERCENT: 77 %
MDC IDC STAT BRADY AP VS PERCENT: 0 %
MDC IDC STAT BRADY AS VP PERCENT: 23 %
MDC IDC STAT BRADY AS VS PERCENT: 0 %

## 2016-06-24 ENCOUNTER — Other Ambulatory Visit: Payer: Self-pay | Admitting: Internal Medicine

## 2016-07-28 ENCOUNTER — Other Ambulatory Visit: Payer: Self-pay

## 2016-07-28 DIAGNOSIS — E785 Hyperlipidemia, unspecified: Secondary | ICD-10-CM

## 2016-07-28 DIAGNOSIS — N182 Chronic kidney disease, stage 2 (mild): Principal | ICD-10-CM

## 2016-07-28 DIAGNOSIS — E1122 Type 2 diabetes mellitus with diabetic chronic kidney disease: Secondary | ICD-10-CM

## 2016-07-28 DIAGNOSIS — I10 Essential (primary) hypertension: Secondary | ICD-10-CM

## 2016-07-31 ENCOUNTER — Other Ambulatory Visit: Payer: Medicare Other

## 2016-07-31 LAB — HEMOGLOBIN A1C
HEMOGLOBIN A1C: 5.2 % (ref <5.7)
MEAN PLASMA GLUCOSE: 103 mg/dL

## 2016-08-01 LAB — BASIC METABOLIC PANEL
BUN: 30 mg/dL — AB (ref 7–25)
CHLORIDE: 103 mmol/L (ref 98–110)
CO2: 28 mmol/L (ref 20–31)
Calcium: 9.2 mg/dL (ref 8.6–10.3)
Creat: 1.74 mg/dL — ABNORMAL HIGH (ref 0.70–1.11)
Glucose, Bld: 85 mg/dL (ref 65–99)
POTASSIUM: 4.5 mmol/L (ref 3.5–5.3)
Sodium: 139 mmol/L (ref 135–146)

## 2016-08-01 LAB — LIPID PANEL
CHOLESTEROL: 127 mg/dL (ref <200)
HDL: 49 mg/dL (ref >40)
LDL Cholesterol: 62 mg/dL (ref <100)
TRIGLYCERIDES: 81 mg/dL (ref <150)
Total CHOL/HDL Ratio: 2.6 Ratio (ref <5.0)
VLDL: 16 mg/dL (ref <30)

## 2016-08-08 ENCOUNTER — Non-Acute Institutional Stay: Payer: Medicare Other | Admitting: Internal Medicine

## 2016-08-08 ENCOUNTER — Encounter: Payer: Self-pay | Admitting: Internal Medicine

## 2016-08-08 VITALS — BP 120/58 | HR 60 | Temp 97.7°F | Ht 68.0 in | Wt 149.8 lb

## 2016-08-08 DIAGNOSIS — I10 Essential (primary) hypertension: Secondary | ICD-10-CM | POA: Diagnosis not present

## 2016-08-08 DIAGNOSIS — E785 Hyperlipidemia, unspecified: Secondary | ICD-10-CM

## 2016-08-08 DIAGNOSIS — E1151 Type 2 diabetes mellitus with diabetic peripheral angiopathy without gangrene: Secondary | ICD-10-CM

## 2016-08-08 MED ORDER — PRAVASTATIN SODIUM 10 MG PO TABS: ORAL_TABLET | ORAL | 4 refills | Status: AC

## 2016-08-08 NOTE — Progress Notes (Signed)
Facility  [facility]    Place of Service: Clinic (12)     Allergies  Allergen Reactions  . Codeine Other (See Comments)    Runny eyes and nose,  Teary eyes  . Glyburide Other (See Comments)    Unknown  . Lisinopril Itching  . Uroxatral [Alfuzosin] Other (See Comments)    Heart races  . Epinephrine Palpitations  . Hctz [Hydrochlorothiazide] Rash    Chief Complaint  Patient presents with  . Medical Management of Chronic Issues    4 month medication management blood sugar, blood pressure, cholesterol, review labs. Checks BS at home once daily range 98-110 at bedtime    HPI:  DM (diabetes mellitus), type 2 with peripheral vascular complications ([place]) - controlled  Essential hypertension - controlled  Hyperlipidemia, unspecified hyperlipidemia type - LDL running low on 20 mg pravastatin.   Medications: Patient's Medications  New Prescriptions   No medications on file  Previous Medications   ASPIRIN 81 MG TABLET    Take 81 mg by mouth daily.   CHOLECALCIFEROL (VITAMIN D) 1000 UNITS TABLET    Take 1,000 Units by mouth daily.   FINASTERIDE (PROSCAR) 5 MG TABLET    TAKE 1 TABLET ONCE DAILY TO TREAT ENLARGED PROSTATE   GLIMEPIRIDE (AMARYL) 1 MG TABLET    Take One tablet by mouth once daily at breakfast to treat diabetes   GLUCOSE BLOOD TEST STRIP    Check blood sugar once daily.   IBUPROFEN (ADVIL,MOTRIN) 200 MG TABLET    Take 200 mg by mouth daily as needed for mild pain.    LORATADINE (CLARITIN) 10 MG TABLET    Take one tablet by mouth at bedtime for allergies   LOSARTAN (COZAAR) 50 MG TABLET    One daily to control BP and to help protect the kidneys   MAGNESIUM OXIDE (MAG-OX) 400 MG TABLET    One daily to help leg cramps   METOPROLOL TARTRATE (LOPRESSOR) 25 MG TABLET    TAKE ONE AND ONE-HALF TABLETS TWICE A DAY   MINERAL OIL LIQUID    Apply 15 mLs to eye daily as needed (for eye).    MULTIPLE VITAMINS-MINERALS (ICAPS PO)    Take 1 capsule by mouth daily.    OMEPRAZOLE  (PRILOSEC) 40 MG CAPSULE    TAKE 1 CAPSULE DAILY   PRAVASTATIN (PRAVACHOL) 20 MG TABLET    One nightly to lower cholesterol   SERTRALINE (ZOLOFT) 50 MG TABLET    TAKE ONE-HALF (1/2) TABLET EVERY FIVE DAYS TO HELP ANXIETY AND DEPRESSION  Modified Medications   No medications on file  Discontinued Medications   No medications on file     Review of Systems  Constitutional: Negative for activity change, appetite change, chills, diaphoresis, fatigue, fever and unexpected weight change.  HENT: Positive for hearing loss. Negative for congestion and ear pain.        Significant loss of taste ability  Eyes: Negative for pain.       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  Respiratory: Negative for apnea, cough, choking, chest tightness and shortness of breath.   Cardiovascular: Positive for chest pain (episode on 09/22/13 has resolved). Negative for palpitations and leg swelling.       Hx right carotid endarterectomy.  Gastrointestinal: Positive for constipation.       Flatulence. History hiatal hernia, GERD, esoph stricture with dilation, and diverticulosis. History colon polyps  Genitourinary:       Increased  frequency of urination. Nocturia x3. Slow, weak urinary stream. Urinary hesitation. History of BPH.  Musculoskeletal: Positive for arthralgias (Shoulders and back).       Bilateral leg cramps. Snappin of a tendon over a boney prominence at the medial left knee. Denies pain or any impact on walking.  Skin:       Generalized itching. Worse in the back and behind the knees. Uses over-the-counter cortisone cream.  Neurological: Negative for tremors, seizures, syncope, speech difficulty, numbness and headaches.       History TIA  Hematological: Negative.   Psychiatric/Behavioral: Negative.     Vitals:   08/08/16 0946  BP: (!) 120/58  Pulse: 60  Temp: 97.7 F (36.5 C)  TempSrc: Oral  SpO2: 99%  Weight: 149 lb 12.8 oz (67.9 kg)    Height: _0  (1.727 m)   Wt Readings from Last 3 Encounters:  08/08/16 149 lb 12.8 oz (67.9 kg)  04/11/16 147 lb (66.7 kg)  12/07/15 146 lb (66.2 kg)    Body mass index is 22.78 kg/m.  Physical Exam  Constitutional: He is oriented to person, place, and time. He appears well-developed and well-nourished.  HENT:  Head: Normocephalic and atraumatic.  Mouth/Throat: No oropharyngeal exudate.  Loss of hearing. Aid in right ear.  Eyes:  Artificial left eye. Rx lenses.  Neck: Neck supple. No JVD present. No tracheal deviation present. No thyromegaly present.  Cardiovascular: Normal rate, regular rhythm and normal heart sounds.   Pacemaker Diminished PT and DP bilaterally  Pulmonary/Chest: Effort normal and breath sounds normal. He has no wheezes. He has no rales.  Abdominal: Bowel sounds are normal. He exhibits no distension and no mass. There is no tenderness.  Musculoskeletal: Normal range of motion. He exhibits no edema or tenderness.  Snapping at the left medial knee where the tendon passes over a bony prominence  Lymphadenopathy:    He has no cervical adenopathy.  Neurological: He is alert and oriented to person, place, and time. He displays normal reflexes. No cranial nerve deficit. He exhibits normal muscle tone. Coordination normal.  Skin: Skin is warm and dry.  Psychiatric: He has a normal mood and affect. His behavior is normal. Judgment and thought content normal.     Labs reviewed: Lab Summary Latest Ref Rng & Units 07/31/2016 04/03/2016 12/02/2015 06/03/2015  Hemoglobin 13.0-17.0 g/dL (None) (None) (None) (None)  Hematocrit 39.0-52.0 % (None) (None) (None) (None)  White count - (None) (None) (None) (None)  Platelet count - (None) (None) (None) (None)  Sodium 135 - 146 mmol/L 139 139 139 140  Potassium 3.5 - 5.3 mmol/L 4.5 4.3 4.4 4.3  Calcium 8.6 - 10.3 mg/dL 9.2 9.2 (None) (None)  Phosphorus - (None) (None) (None) (None)  Creatinine 0.70 - 1.11 mg/dL 1.74(H) 1.63(H)  1.6(A) 1.5(A)  AST 10 - 35 U/L (None) 16 (None) 15  Alk Phos 40 - 115 U/L (None) 59 (None) 48  Bilirubin 0.2 - 1.2 mg/dL (None) 0.6 (None) (None)  Glucose 65 - 99 mg/dL 85 95 79 93  Cholesterol <200 mg/dL 127 (None) 119 122  HDL cholesterol >40 mg/dL 49 (None) 50 47  Triglycerides <150 mg/dL 81 (None) 85 88  LDL Direct - (None) (None) (None) (None)  LDL Calc <100 mg/dL 62 (None) 52 57  Total protein 6.1 - 8.1 g/dL (None) 6.1 (None) (None)  Albumin 3.6 - 5.1 g/dL (None) 4.0 (None) (None)  Some recent data might be hidden   No results found for: TSH Lab Results  Component  Value Date   BUN 30 (H) 07/31/2016   BUN 26 (H) 04/03/2016   BUN 23 (A) 12/02/2015   Lab Results  Component Value Date   CREATININE 1.74 (H) 07/31/2016   CREATININE 1.63 (H) 04/03/2016   CREATININE 1.6 (A) 12/02/2015   Lab Results  Component Value Date   HGBA1C 5.2 07/31/2016   HGBA1C 5.3 04/03/2016   HGBA1C 5.7 12/02/2015       Assessment/Plan  1. DM (diabetes mellitus), type 2 with peripheral vascular complications (HCC) The current medical regimen is effective;  continue present plan and medications. - Hemoglobin A1c; Future - Basic metabolic panel; Future  2. Essential hypertension The current medical regimen is effective;  continue present plan and medications. - Basic metabolic panel; Future  3. Hyperlipidemia, unspecified hyperlipidemia type Reduce pravastatin to 10 MG tablet - Lipid panel; Future

## 2016-08-09 ENCOUNTER — Other Ambulatory Visit: Payer: Self-pay | Admitting: Internal Medicine

## 2016-08-09 ENCOUNTER — Ambulatory Visit (INDEPENDENT_AMBULATORY_CARE_PROVIDER_SITE_OTHER): Payer: Medicare Other | Admitting: Internal Medicine

## 2016-08-09 ENCOUNTER — Encounter (INDEPENDENT_AMBULATORY_CARE_PROVIDER_SITE_OTHER): Payer: Self-pay

## 2016-08-09 ENCOUNTER — Encounter: Payer: Self-pay | Admitting: Internal Medicine

## 2016-08-09 ENCOUNTER — Encounter: Payer: Medicare Other | Admitting: Internal Medicine

## 2016-08-09 VITALS — BP 140/48 | HR 66 | Ht 68.0 in | Wt 149.4 lb

## 2016-08-09 DIAGNOSIS — I442 Atrioventricular block, complete: Secondary | ICD-10-CM

## 2016-08-09 LAB — CUP PACEART INCLINIC DEVICE CHECK
Battery Impedance: 232 Ohm
Battery Voltage: 2.78 V
Brady Statistic AP VP Percent: 78 %
Brady Statistic AP VS Percent: 0 %
Implantable Lead Implant Date: 20050825
Implantable Lead Location: 753860
Implantable Lead Model: 5076
Lead Channel Impedance Value: 468 Ohm
Lead Channel Pacing Threshold Amplitude: 0.75 V
Lead Channel Pacing Threshold Amplitude: 0.75 V
Lead Channel Pacing Threshold Pulse Width: 0.4 ms
Lead Channel Pacing Threshold Pulse Width: 0.4 ms
Lead Channel Pacing Threshold Pulse Width: 0.4 ms
Lead Channel Sensing Intrinsic Amplitude: 4 mV
MDC IDC LEAD IMPLANT DT: 20050825
MDC IDC LEAD LOCATION: 753859
MDC IDC MSMT BATTERY REMAINING LONGEVITY: 111 mo
MDC IDC MSMT LEADCHNL RA IMPEDANCE VALUE: 466 Ohm
MDC IDC MSMT LEADCHNL RA PACING THRESHOLD AMPLITUDE: 0.75 V
MDC IDC MSMT LEADCHNL RV PACING THRESHOLD AMPLITUDE: 1 V
MDC IDC MSMT LEADCHNL RV PACING THRESHOLD PULSEWIDTH: 0.46 ms
MDC IDC PG IMPLANT DT: 20150316
MDC IDC SESS DTM: 20180321121802
MDC IDC SET LEADCHNL RA PACING AMPLITUDE: 2 V
MDC IDC SET LEADCHNL RV PACING AMPLITUDE: 2 V
MDC IDC SET LEADCHNL RV PACING PULSEWIDTH: 0.46 ms
MDC IDC SET LEADCHNL RV SENSING SENSITIVITY: 4 mV
MDC IDC STAT BRADY AS VP PERCENT: 22 %
MDC IDC STAT BRADY AS VS PERCENT: 0 %

## 2016-08-09 NOTE — Progress Notes (Signed)
HPI Eugene Long returns today for followup. He is a very pleasant 83 year old man with complete heart block, status post permanent pacemaker insertion. He also is a history of hypertension, diabetes, and dyslipidemia. Despite his advanced age, he continues to do well. He denies chest pain, shortness of breath, or peripheral edema. He has not been in the hospital in the past year.  Allergies  Allergen Reactions  . Codeine Other (See Comments)    Runny eyes and nose,  Teary eyes  . Glyburide Other (See Comments)    Unknown  . Lisinopril Itching  . Magnesium Oxide     Rash, itching  . Uroxatral [Alfuzosin] Other (See Comments)    Heart races  . Epinephrine Palpitations  . Hctz [Hydrochlorothiazide] Rash     Current Outpatient Prescriptions  Medication Sig Dispense Refill  . aspirin 81 MG tablet Take 81 mg by mouth daily.    . cholecalciferol (VITAMIN D) 1000 UNITS tablet Take 1,000 Units by mouth daily.    . finasteride (PROSCAR) 5 MG tablet TAKE 1 TABLET BY MOUTH ONCE DAILY TO TREAT ENLARGED PROSTATE    . glimepiride (AMARYL) 1 MG tablet Take One tablet by mouth once daily at breakfast to treat diabetes 90 tablet 3  . ibuprofen (ADVIL,MOTRIN) 200 MG tablet Take 200 mg by mouth daily as needed for mild pain.     Marland Kitchen loratadine (CLARITIN) 10 MG tablet Take one tablet by mouth at bedtime for allergies    . losartan (COZAAR) 50 MG tablet One daily to control BP and to help protect the kidneys 90 tablet 4  . metoprolol tartrate (LOPRESSOR) 25 MG tablet TAKE ONE AND ONE-HALF TABLETS TWICE A DAY 270 tablet 3  . mineral oil liquid Apply 15 mLs to eye daily as needed (for eye).     . Multiple Vitamins-Minerals (ICAPS PO) Take 1 capsule by mouth daily.     Marland Kitchen omeprazole (PRILOSEC) 40 MG capsule TAKE 1 CAPSULE DAILY 90 capsule 3  . ONE TOUCH ULTRA TEST test strip CHECK BLOOD SUGAR ONCE DAILY. 100 each 10  . pravastatin (PRAVACHOL) 10 MG tablet One daily to lower cholesterol 90 tablet 4  . sertraline  (ZOLOFT) 50 MG tablet TAKE ONE-HALF (1/2) TABLET EVERY FIVE DAYS TO HELP ANXIETY AND DEPRESSION 45 tablet 3   No current facility-administered medications for this visit.      Past Medical History:  Diagnosis Date  . Actinic keratosis   . Acute venous embolism and thrombosis of unspecified deep vessels of lower extremity   . Anxiety   . Atrioventricular block, complete ([place])   . Benign neoplasm of colon    Polyp  . BPH (benign prostatic hypertrophy)   . Chronic kidney disease, stage II (mild) 12/10/2012  . Complete rupture of rotator cuff   . Contracture of palmar fascia   . Coronary atherosclerosis of native coronary artery   . Depressive disorder, not elsewhere classified   . Diabetes mellitus ([place])   . Diaphragmatic hernia without mention of obstruction or gangrene   . Diverticulosis    colon, Hx of  . Diverticulosis of colon (without mention of hemorrhage)   . Dysphagia, unspecified(787.20)   . Esophageal reflux   . Hypertension   . Hypertrophy of prostate with urinary obstruction and other lower urinary tract symptoms (LUTS)   . Insomnia, unspecified   . Lumbago   . 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 Osteoarthrosis, unspecified whether generalized or localized,  unspecified site   . Other and unspecified hyperlipidemia   . Other specified disorder of male genital organs(608.89)    right epididymal cyst  . Peripheral vascular disease, unspecified   . Stricture and stenosis of esophagus   . Unspecified constipation   . Unspecified hearing loss   . Unspecified pruritic disorder     ROS:   All systems reviewed and negative except as noted in the HPI.   Past Surgical History:  Procedure Laterality Date  . APPENDECTOMY  1942  . arthoscopy right knee  1980  . CAROTID ENDARTERECTOMY  1987   right  . CATARACT EXTRACTION  [id number]  . COLONOSCOPY    . COLONOSCOPY  2005   Dr. Sharlett Iles  . ENUCLEATION  02/26/2009   left eye  choroidal melanoma  . KNEE ARTHROSCOPY Left 1993  . PACEMAKER GENERATOR CHANGE  08/04/13   Dr. Lovena Le  . PACEMAKER INSERTION  2005; 08/04/2013   Dual chamber MDT pacemaker implanted by Dr Lovena Le in 2005 for CHB; gen change by Dr Lovena Le 07/2013 - MDT ADDRL1   . PERMANENT PACEMAKER GENERATOR CHANGE N/A 08/04/2013   Procedure: PERMANENT PACEMAKER GENERATOR CHANGE;  Surgeon: Evans Lance, MD;  Location: [facility] CATH LAB;  Service: Cardiovascular;  Laterality: N/A;  . TONSILLECTOMY  1931     Family History  Problem Relation Age of Onset  . Liver cancer Father   . Pancreatic cancer Father   . Cancer Father   . Heart disease Sister     CHF  . Diabetes Paternal Uncle      Social History   Social History  . Marital status: Married    Spouse name: N/A  . Number of children: 3  . Years of education: N/A   Occupational History  . retired Teacher, English as a foreign language Retired   Social History Main Topics  . Smoking status: Former Smoker    Packs/day: 0.50    Years: 17.00    Types: Cigarettes    Quit date: 05/22/1960  . Smokeless tobacco: Never Used  . Alcohol use No  . Drug use: No  . Sexual activity: No   Other Topics Concern  . Not on file   Social History Narrative   Patient [facility] at [facility] since 2003.   Widowed   Living will, POA   In [place]   Pacemaker   Previously worked in Research officer, trade union   Exercise walks 15-20 minutes twice a day most days     BP (!) 140/48   Pulse 66   Ht 5\' 8"  (1.727 m)   Wt 149 lb 6.4 oz (67.8 kg)   BMI 22.72 kg/m   Physical Exam:  Well appearing elderly man, looks younger than his stated age, NAD HEENT: Unremarkable Neck:  7 cm JVD, no thyromegally Lungs:  Clear with no wheezes, rales, or rhonchi. Well-healed pacemaker incision. HEART:  Regular rate rhythm, no murmurs, no rubs, no clicks Abd:  soft, positive bowel sounds, no organomegally, no rebound, no guarding Ext:  2 plus pulses, no edema, no cyanosis, no  clubbing Skin:  No rashes no nodules Neuro:  CN II through XII intact, motor grossly intact  ECG - NSR with AV pacing  DEVICE  Normal device function.  See PaceArt for details.   Assess/Plan: 1. Complete heart block - he is doing well, s/p PPM insertion 2. HTN - his blood pressure is reasonably well controlled. Will follow. 3. PM - his medtronic DDD PM is working normally. Will  recheck in several months.  Eugene Long.D.

## 2016-08-09 NOTE — Patient Instructions (Signed)
Medication Instructions:  Your physician recommends that you continue on your current medications as directed. Please refer to the Current Medication list given to you today.   Labwork: None ordered  Testing/Procedures: None ordered  Follow-Up: Remote monitoring is used to monitor your Pacemaker from home. This monitoring reduces the number of office visits required to check your device to one time per year. It allows [place] to keep an eye on the functioning of your device to ensure it is working properly. You are scheduled for a device check from home on 11/08/16. You may send your transmission at any time that day. If you have a wireless device, the transmission will be sent automatically. After your physician reviews your transmission, you will receive a postcard with your next transmission date.  Your physician wants you to follow-up in: 12 months with Dr. Lovena Le. You will receive a reminder letter in the mail two months in advance. If you don't receive a letter, please call our office to schedule the follow-up appointment.   Any Other Special Instructions Will Be Listed Below (If Applicable).     If you need a refill on your cardiac medications before your next appointment, please call your pharmacy.

## 2016-08-11 ENCOUNTER — Other Ambulatory Visit: Payer: Self-pay | Admitting: Internal Medicine

## 2016-10-14 ENCOUNTER — Other Ambulatory Visit: Payer: Self-pay | Admitting: Internal Medicine

## 2016-10-31 ENCOUNTER — Encounter: Payer: Self-pay | Admitting: Internal Medicine

## 2016-11-08 ENCOUNTER — Encounter: Payer: Medicare Other | Admitting: *Deleted

## 2016-11-10 ENCOUNTER — Encounter: Payer: Self-pay | Admitting: Cardiology

## 2016-11-16 NOTE — Addendum Note (Signed)
Addended by: Royann Shivers A on: 11/16/2016 03:55 PM   Modules accepted: Orders

## 2016-11-17 ENCOUNTER — Telehealth: Payer: Self-pay | Admitting: *Deleted

## 2016-11-17 NOTE — Telephone Encounter (Signed)
Spoke w/ pt and informed him that we did not receive his remote transmission on 11-08-2016. Pt is going to send again on 11-21-16. He is aware that if he needs help to call the office for help.

## 2016-11-17 NOTE — Telephone Encounter (Signed)
New message    Pt states that he got a letter to r/s his remote transmission because we did not receive it. He states he sent it in and if he did not he needs instructions for his appt on the 3rd of July of how to properly send it in.

## 2016-11-21 ENCOUNTER — Encounter: Payer: Medicare Other | Admitting: *Deleted

## 2016-11-24 ENCOUNTER — Encounter: Payer: Self-pay | Admitting: Cardiology

## 2016-12-11 ENCOUNTER — Ambulatory Visit (INDEPENDENT_AMBULATORY_CARE_PROVIDER_SITE_OTHER): Payer: Medicare Other | Admitting: *Deleted

## 2016-12-11 DIAGNOSIS — I442 Atrioventricular block, complete: Secondary | ICD-10-CM

## 2016-12-12 NOTE — Progress Notes (Signed)
Remote pacemaker transmission.   

## 2016-12-14 ENCOUNTER — Encounter: Payer: Self-pay | Admitting: Cardiology

## 2016-12-21 LAB — HM DIABETES EYE EXAM

## 2016-12-29 ENCOUNTER — Other Ambulatory Visit: Payer: Self-pay

## 2016-12-29 DIAGNOSIS — I10 Essential (primary) hypertension: Secondary | ICD-10-CM

## 2016-12-29 DIAGNOSIS — E785 Hyperlipidemia, unspecified: Secondary | ICD-10-CM

## 2017-01-09 LAB — CUP PACEART REMOTE DEVICE CHECK
Battery Remaining Longevity: 100 mo
Date Time Interrogation Session: 20180723170532
Implantable Lead Implant Date: 20050825
Implantable Lead Location: 753859
Implantable Pulse Generator Implant Date: 20150316
Lead Channel Impedance Value: 494 Ohm
Lead Channel Pacing Threshold Amplitude: 1.25 V
Lead Channel Pacing Threshold Pulse Width: 0.4 ms
Lead Channel Setting Pacing Amplitude: 2 V
Lead Channel Setting Sensing Sensitivity: 4 mV
MDC IDC LEAD IMPLANT DT: 20050825
MDC IDC LEAD LOCATION: 753860
MDC IDC MSMT BATTERY IMPEDANCE: 256 Ohm
MDC IDC MSMT BATTERY VOLTAGE: 2.78 V
MDC IDC MSMT LEADCHNL RA PACING THRESHOLD AMPLITUDE: 0.875 V
MDC IDC MSMT LEADCHNL RA PACING THRESHOLD PULSEWIDTH: 0.4 ms
MDC IDC MSMT LEADCHNL RV IMPEDANCE VALUE: 475 Ohm
MDC IDC SET LEADCHNL RV PACING AMPLITUDE: 2.5 V
MDC IDC SET LEADCHNL RV PACING PULSEWIDTH: 0.4 ms
MDC IDC STAT BRADY AP VP PERCENT: 81 %
MDC IDC STAT BRADY AP VS PERCENT: 0 %
MDC IDC STAT BRADY AS VP PERCENT: 19 %
MDC IDC STAT BRADY AS VS PERCENT: 0 %

## 2017-01-12 ENCOUNTER — Telehealth: Payer: Self-pay | Admitting: Internal Medicine

## 2017-01-12 NOTE — Telephone Encounter (Signed)
Left msg asking pt to call and schedule AWV-I at [facility] clinic on afternoon of 01/17/17. VDM (DD)

## 2017-02-03 ENCOUNTER — Other Ambulatory Visit: Payer: Self-pay | Admitting: Internal Medicine

## 2017-02-05 LAB — COMPREHENSIVE METABOLIC PANEL
AG RATIO: 2 (calc) (ref 1.0–2.5)
ALT: 10 U/L (ref 9–46)
AST: 16 U/L (ref 10–35)
Albumin: 4.2 g/dL (ref 3.6–5.1)
Alkaline phosphatase (APISO): 62 U/L (ref 40–115)
BUN / CREAT RATIO: 19 (calc) (ref 6–22)
BUN: 31 mg/dL — ABNORMAL HIGH (ref 7–25)
CHLORIDE: 103 mmol/L (ref 98–110)
CO2: 28 mmol/L (ref 20–32)
Calcium: 9.5 mg/dL (ref 8.6–10.3)
Creat: 1.62 mg/dL — ABNORMAL HIGH (ref 0.70–1.11)
GLOBULIN: 2.1 g/dL (ref 1.9–3.7)
GLUCOSE: 87 mg/dL (ref 65–99)
Potassium: 4.5 mmol/L (ref 3.5–5.3)
SODIUM: 138 mmol/L (ref 135–146)
Total Bilirubin: 0.7 mg/dL (ref 0.2–1.2)
Total Protein: 6.3 g/dL (ref 6.1–8.1)

## 2017-02-05 LAB — LIPID PANEL
CHOL/HDL RATIO: 2.8 (calc) (ref <5.0)
Cholesterol: 135 mg/dL (ref <200)
HDL: 49 mg/dL (ref >40)
LDL CHOLESTEROL (CALC): 69 mg/dL
NON-HDL CHOLESTEROL (CALC): 86 mg/dL (ref <130)
Triglycerides: 89 mg/dL (ref <150)

## 2017-02-05 LAB — HEMOGLOBIN A1C
Hgb A1c MFr Bld: 5.2 % of total Hgb (ref <5.7)
Mean Plasma Glucose: 103 (calc)
eAG (mmol/L): 5.7 (calc)

## 2017-02-13 ENCOUNTER — Encounter: Payer: Medicare Other | Admitting: Internal Medicine

## 2017-02-14 ENCOUNTER — Non-Acute Institutional Stay: Payer: Medicare Other | Admitting: Internal Medicine

## 2017-02-14 ENCOUNTER — Encounter: Payer: Self-pay | Admitting: Internal Medicine

## 2017-02-14 VITALS — BP 134/70 | HR 62 | Temp 97.8°F | Resp 16 | Ht 68.0 in | Wt 149.6 lb

## 2017-02-14 DIAGNOSIS — J3089 Other allergic rhinitis: Secondary | ICD-10-CM

## 2017-02-14 DIAGNOSIS — F329 Major depressive disorder, single episode, unspecified: Secondary | ICD-10-CM | POA: Diagnosis not present

## 2017-02-14 DIAGNOSIS — K219 Gastro-esophageal reflux disease without esophagitis: Secondary | ICD-10-CM | POA: Diagnosis not present

## 2017-02-14 DIAGNOSIS — R3912 Poor urinary stream: Secondary | ICD-10-CM

## 2017-02-14 DIAGNOSIS — M2550 Pain in unspecified joint: Secondary | ICD-10-CM

## 2017-02-14 DIAGNOSIS — E782 Mixed hyperlipidemia: Secondary | ICD-10-CM

## 2017-02-14 DIAGNOSIS — F32A Depression, unspecified: Secondary | ICD-10-CM

## 2017-02-14 DIAGNOSIS — N183 Chronic kidney disease, stage 3 unspecified: Secondary | ICD-10-CM

## 2017-02-14 DIAGNOSIS — N401 Enlarged prostate with lower urinary tract symptoms: Secondary | ICD-10-CM | POA: Diagnosis not present

## 2017-02-14 DIAGNOSIS — I10 Essential (primary) hypertension: Secondary | ICD-10-CM

## 2017-02-14 DIAGNOSIS — E1122 Type 2 diabetes mellitus with diabetic chronic kidney disease: Secondary | ICD-10-CM | POA: Diagnosis not present

## 2017-02-14 DIAGNOSIS — I129 Hypertensive chronic kidney disease with stage 1 through stage 4 chronic kidney disease, or unspecified chronic kidney disease: Secondary | ICD-10-CM

## 2017-02-14 MED ORDER — OMEPRAZOLE 40 MG PO CPDR: 40.0000 mg | DELAYED_RELEASE_CAPSULE | Freq: Every day | ORAL | 3 refills | Status: DC

## 2017-02-14 NOTE — Progress Notes (Signed)
[facility]  Provider: Blanchie Serve MD   Location:  [place]   Place of Service:  Clinic (12)  PCP: Blanchie Serve, MD Patient Care Team: Blanchie Serve, MD as PCP - General (Internal Medicine) Shon Hough, MD as Consulting Physician (Ophthalmology) Irine Seal, MD as Consulting Physician (Urology) Sable Feil, MD as Consulting Physician (Gastroenterology) Gerarda Fraction, MD as Consulting Physician (Ophthalmology) Floyd, [facility] Evans Lance, MD (Cardiology) Sable Feil, MD as Consulting Physician (Gastroenterology) Rosendo Gros, Marnee Spring (Inactive) as Consulting Physician (Ophthalmology) Ngetich, Nelda Bucks, NP as Nurse Practitioner (Family Medicine)  Extended Emergency Contact Information Primary Emergency Contact: Carmell [place], [place] Phone: [phone] Relation: Daughter   Goals of Care: Advanced Directive information Advanced Directives 08/08/2016  Does Patient Have a Medical Advance Directive? Yes  Type of Paramedic of [place];Living will  Does patient want to make changes to medical advance directive? -  Copy of [place] in Chart? Yes    Chief Complaint  Patient presents with  . Medical Management of Chronic Issues    6 month follow up. No cocncerns at this time.   . Medication Refill    Omeprazole has no more refills   . Results    Discuss labs    HPI: Patient is a 83 y.o. male seen today for routine visit.   Chronic depression- currently on sertraline 25 mg every 5 days.  Allergic rhinitis- currently taking loratadine on daily basis and this has been helpful. Has clear nasal discharge  Hypertension- on lopressor 37.5 mg bid, losartan 50 mg daily and baby aspirin. Denies dizziness, fall, headache, chest pain or dyspnea. Tolerating med well.   Dm type 2 with ckd- currently on glimepiride 1 mg daily with statin and  aspirin. Denies hypoglycemic symptom.  GERD- on omeprazole 40 mg daily. Has history of hiatal hernia. Denies symptom at present. Has failed reduction of dose in past.   Hyperlipidemia- currently on pravastatin 10 mg daily. No muscle aches or soreness reported.   BPH- slow flow of urine. Taking proscar. Has to wake up 2-3 times at night to urinate. Denies dysuria or hematuria.     Past Medical History:  Diagnosis Date  . Actinic keratosis   . Acute venous embolism and thrombosis of unspecified deep vessels of lower extremity   . Anxiety   . Atrioventricular block, complete ([place])   . Benign neoplasm of colon    Polyp  . BPH (benign prostatic hypertrophy)   . Chronic kidney disease, stage II (mild) 12/10/2012  . Complete rupture of rotator cuff   . Contracture of palmar fascia   . Coronary atherosclerosis of native coronary artery   . Depressive disorder, not elsewhere classified   . Diabetes mellitus ([place])   . Diaphragmatic hernia without mention of obstruction or gangrene   . Diverticulosis    colon, Hx of  . Diverticulosis of colon (without mention of hemorrhage)   . Dysphagia, unspecified(787.20)   . Esophageal reflux   . Hypertension   . Hypertrophy of prostate with urinary obstruction and other lower urinary tract symptoms (LUTS)   . Insomnia, unspecified   . Lumbago   . 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 Osteoarthrosis, unspecified whether generalized or localized, unspecified site   . Other and unspecified hyperlipidemia   . Other specified disorder  of male genital organs(608.89)    right epididymal cyst  . Peripheral vascular disease, unspecified ([place])   . Stricture and stenosis of esophagus   . Unspecified constipation   . Unspecified hearing loss   . Unspecified pruritic disorder    Past Surgical History:  Procedure Laterality Date  . APPENDECTOMY  1942  . arthoscopy right knee  1980  . CAROTID ENDARTERECTOMY  1987    right  . CATARACT EXTRACTION  [id number]  . COLONOSCOPY    . COLONOSCOPY  2005   Dr. Sharlett Iles  . ENUCLEATION  02/26/2009   left eye choroidal melanoma  . KNEE ARTHROSCOPY Left 1993  . PACEMAKER GENERATOR CHANGE  08/04/13   Dr. Lovena Le  . PACEMAKER INSERTION  2005; 08/04/2013   Dual chamber MDT pacemaker implanted by Dr Lovena Le in 2005 for CHB; gen change by Dr Lovena Le 07/2013 - MDT ADDRL1   . PERMANENT PACEMAKER GENERATOR CHANGE N/A 08/04/2013   Procedure: PERMANENT PACEMAKER GENERATOR CHANGE;  Surgeon: Evans Lance, MD;  Location: [facility] CATH LAB;  Service: Cardiovascular;  Laterality: N/A;  . TONSILLECTOMY  1931    reports that he quit smoking about 56 years ago. His smoking use included Cigarettes. He has a 8.50 pack-year smoking history. He has never used smokeless tobacco. He reports that he does not drink alcohol or use drugs. Social History   Social History  . Marital status: Married    Spouse name: N/A  . Number of children: 3  . Years of education: N/A   Occupational History  . retired Teacher, English as a foreign language Retired   Social History Main Topics  . Smoking status: Former Smoker    Packs/day: 0.50    Years: 17.00    Types: Cigarettes    Quit date: 05/22/1960  . Smokeless tobacco: Never Used  . Alcohol use No  . Drug use: No  . Sexual activity: No   Other Topics Concern  . Not on file   Social History Narrative   Patient lives at [facility] since 2003.   Widowed   Living will, POA   In [place]   Pacemaker   Previously worked in Research officer, trade union   Exercise walks 15-20 minutes twice a day most days    Functional Status Survey:    Family History  Problem Relation Age of Onset  . Liver cancer Father   . Pancreatic cancer Father   . Cancer Father   . Heart disease Sister        CHF  . Diabetes Paternal Uncle     Health Maintenance  Topic Date Due  . PNA vac Low Risk Adult (2 of 2 - PCV13) 05/22/2006  . TETANUS/TDAP  05/23/2007  . FOOT EXAM   11/26/2014  . INFLUENZA VACCINE  12/20/2016  . HEMOGLOBIN A1C  08/05/2017  . OPHTHALMOLOGY EXAM  12/21/2017    Allergies  Allergen Reactions  . Codeine Other (See Comments)    Runny eyes and nose,  Teary eyes  . Glyburide Other (See Comments)    Unknown  . Lisinopril Itching  . Magnesium Oxide     Rash, itching  . Uroxatral [Alfuzosin] Other (See Comments)    Heart races  . Epinephrine Palpitations  . Hctz [Hydrochlorothiazide] Rash    Outpatient Encounter Prescriptions as of 02/14/2017  Medication Sig  . aspirin 81 MG tablet Take 81 mg by mouth daily.  . cholecalciferol (VITAMIN D) 1000 UNITS tablet Take 2,000 Units by mouth daily.   Marland Kitchen [facility]  LANCETS THIN 26G MISC CHECK BLOOD SUGAR ONCE A DAY  . finasteride (PROSCAR) 5 MG tablet TAKE 1 TABLET BY MOUTH ONCE DAILY TO TREAT ENLARGED PROSTATE  . glimepiride (AMARYL) 1 MG tablet TAKE 1 TABLET DAILY AT BREAKFAST TO TREAT DIABETES  . ibuprofen (ADVIL,MOTRIN) 200 MG tablet Take 200 mg by mouth as needed for mild pain.   Marland Kitchen loratadine (CLARITIN) 10 MG tablet Take one tablet by mouth at bedtime for allergies  . losartan (COZAAR) 50 MG tablet One daily to control BP and to help protect the kidneys  . metoprolol tartrate (LOPRESSOR) 25 MG tablet TAKE ONE AND ONE-HALF TABLETS TWICE A DAY  . Multiple Vitamins-Minerals (ICAPS PO) Take 1 capsule by mouth daily.   Marland Kitchen omeprazole (PRILOSEC) 40 MG capsule TAKE 1 CAPSULE DAILY  . ONE TOUCH ULTRA TEST test strip CHECK BLOOD SUGAR ONCE DAILY.  . pravastatin (PRAVACHOL) 10 MG tablet One daily to lower cholesterol  . sertraline (ZOLOFT) 50 MG tablet TAKE ONE-HALF (1/2) TABLET EVERY FIVE DAYS TO HELP ANXIETY AND DEPRESSION  . [DISCONTINUED] mineral oil liquid Apply 15 mLs to eye daily as needed (for eye).    No facility-administered encounter medications on file as of 02/14/2017.     Review of Systems  Constitutional: Negative for appetite change, chills and fever.  HENT: Positive for hearing loss.  Negative for congestion, ear discharge, ear pain, mouth sores, sinus pain, sore throat and voice change.        Has hearing aid  Eyes: Positive for visual disturbance. Negative for photophobia, pain and itching.       Has corrective glasses, follows with eye doctor  Respiratory: Positive for cough. Negative for shortness of breath and wheezing.        Cough with meals  Cardiovascular: Negative for chest pain, palpitations and leg swelling.       Has a pacemaker  Gastrointestinal: Negative for abdominal pain, blood in stool, constipation, diarrhea, nausea and vomiting.  Genitourinary: Positive for frequency. Negative for dysuria and hematuria.  Musculoskeletal: Positive for arthralgias and back pain. Negative for gait problem.       Has OA, no fall reported, no assistive device used  Skin: Negative for rash and wound.  Neurological: Negative for dizziness, tremors, seizures and headaches.  Psychiatric/Behavioral: Negative for behavioral problems, confusion and sleep disturbance.    Vitals:   02/14/17 1055  BP: 134/70  Pulse: 62  Resp: 16  Temp: 97.8 F (36.6 C)  TempSrc: Oral  SpO2: 99%  Weight: 149 lb 9.6 oz (67.9 kg)  Height: 5\' 8"  (1.727 m)   Body mass index is 22.75 kg/m. Physical Exam  Constitutional: He is oriented to person, place, and time. He appears well-developed and well-nourished. No distress.  HENT:  Head: Normocephalic and atraumatic.  Right Ear: External ear normal.  Left Ear: External ear normal.  Nose: Nose normal.  Mouth/Throat: Oropharynx is clear and moist. No oropharyngeal exudate.  Eyes: Pupils are equal, round, and reactive to light. Conjunctivae and EOM are normal. Right eye exhibits no discharge. Left eye exhibits no discharge. No scleral icterus.  Neck: Normal range of motion. Neck supple. No JVD present. No thyromegaly present.  Cardiovascular: Normal rate and regular rhythm.   No murmur heard. Pulmonary/Chest: Effort normal and breath sounds  normal. No respiratory distress. He has no wheezes. He has no rales. He exhibits no tenderness.  Abdominal: Soft. Bowel sounds are normal. He exhibits no distension. There is no tenderness. There is no rebound and  no guarding.  Musculoskeletal: He exhibits no edema.  Can move all 4 extremities  Lymphadenopathy:    He has no cervical adenopathy.  Neurological: He is alert and oriented to person, place, and time. He has normal reflexes.  Skin: Skin is warm and dry. No rash noted. He is not diaphoretic. No erythema.  Psychiatric: He has a normal mood and affect. His behavior is normal.   Diabetic Foot Exam - Simple   Simple Foot Form Visual Inspection No deformities, no ulcerations, no other skin breakdown bilaterally:  Yes Sensation Testing Intact to touch and monofilament testing bilaterally:  Yes Pulse Check Posterior Tibialis and Dorsalis pulse intact bilaterally:  Yes Comments    Depression screen PHQ 2/9 02/14/2017  Decreased Interest 0  Down, Depressed, Hopeless 0  PHQ - 2 Score 0    Labs reviewed: Basic Metabolic Panel:  Recent Labs  04/03/16 0740 07/31/16 0750 02/05/17 0800  NA 139 139 138  K 4.3 4.5 4.5  CL 103 103 103  CO2 30 28 28   GLUCOSE 95 85 87  BUN 26* 30* 31*  CREATININE 1.63* 1.74* 1.62*  CALCIUM 9.2 9.2 9.5   Liver Function Tests:  Recent Labs  04/03/16 0740 02/05/17 0800  AST 16 16  ALT 9 10  ALKPHOS 59  --   BILITOT 0.6 0.7  PROT 6.1 6.3  ALBUMIN 4.0  --    No results for input(s): LIPASE, AMYLASE in the last 8760 hours. No results for input(s): AMMONIA in the last 8760 hours. CBC: No results for input(s): WBC, NEUTROABS, HGB, HCT, MCV, PLT in the last 8760 hours. Cardiac Enzymes: No results for input(s): CKTOTAL, CKMB, CKMBINDEX, TROPONINI in the last 8760 hours. BNP: Invalid input(s): POCBNP Lab Results  Component Value Date   HGBA1C 5.2 02/05/2017   No results found for: TSH No results found for: VITAMINB12 No results found  for: FOLATE No results found for: IRON, TIBC, FERRITIN  Lipid Panel:  Recent Labs  07/31/16 0750 02/05/17 0800  CHOL 127 135  HDL 49 49  LDLCALC 62  --   TRIG 81 89  CHOLHDL 2.6 2.8   Lab Results  Component Value Date   HGBA1C 5.2 02/05/2017    Procedures since last visit: No results found.  Assessment/Plan  1. Essential hypertension Stable. Continue lopressor 37.5 mg bid, losartan 50 mg daily and baby aspirin. Advised to check BP at home and consider reduction of dosage if appropriate.   2. Gastroesophageal reflux disease without esophagitis Failed dose reduction in past. Continue omeprazole 40 mg daily.   3. Type 2 DM with CKD stage 3 and hypertension (HCC) a1c 5.2. Given his age avoid tight control of diabetes. Discontinue glimepiride for now. Check a1c in 6 months. Continue aspirin, statin and ARB.   4. CKD (chronic kidney disease) stage 3, GFR 30-59 ml/min Continue losartan, monitor renal function. D/c NSAIDs.   5. Depression, unspecified depression type Continue sertraline 25 mg every 5 days, PHQ 2 is 0. counselled on stopping sertraline in attempt for GDR. Pt to review this with friends and family and will call the office to update [place]. He does not want to stop med at present.   6. Mixed hyperlipidemia Continue pravastatin, LDL at goal.   7. Arthralgia, unspecified joint Advised to take OTC tylenol as needed. Avoid NNSAID with his history of GERD and ckd  8. Allergic rhinitis Continue loratadine   9. BPH with urinary symptom Continue proscar and monitor  Labs/tests ordered:  @ORDERS @  Next appointment:  Communication:    Blanchie Serve, MD Internal Medicine [place], [place] [address] Cell Phone (Monday-Friday 8 am - 5 pm): [phone] On Call: [phone] and follow prompts after 5 pm and on weekends Office Phone: [phone] Office Fax: [phone]

## 2017-02-15 ENCOUNTER — Encounter: Payer: Medicare Other | Admitting: Internal Medicine

## 2017-03-05 ENCOUNTER — Other Ambulatory Visit: Payer: Self-pay | Admitting: Internal Medicine

## 2017-03-06 ENCOUNTER — Telehealth: Payer: Self-pay | Admitting: Internal Medicine

## 2017-03-06 NOTE — Telephone Encounter (Signed)
Left message asking pt to call and schedule AWV-I at [facility] clinic. VDM (DD)

## 2017-03-12 ENCOUNTER — Ambulatory Visit (INDEPENDENT_AMBULATORY_CARE_PROVIDER_SITE_OTHER): Payer: Medicare Other | Admitting: *Deleted

## 2017-03-12 DIAGNOSIS — I442 Atrioventricular block, complete: Secondary | ICD-10-CM

## 2017-03-12 NOTE — Progress Notes (Signed)
Remote pacemaker transmission.   

## 2017-03-14 ENCOUNTER — Non-Acute Institutional Stay: Payer: Medicare Other

## 2017-03-14 VITALS — BP 124/60 | HR 71 | Temp 98.0°F | Ht 68.0 in | Wt 150.0 lb

## 2017-03-14 DIAGNOSIS — Z0000 Encounter for general adult medical examination without abnormal findings: Secondary | ICD-10-CM | POA: Diagnosis not present

## 2017-03-14 LAB — CUP PACEART REMOTE DEVICE CHECK
Battery Remaining Longevity: 88 mo
Battery Voltage: 2.77 V
Brady Statistic AP VP Percent: 80 %
Brady Statistic AS VP Percent: 20 %
Implantable Lead Implant Date: 20050825
Implantable Lead Model: 5076
Implantable Pulse Generator Implant Date: 20150316
Lead Channel Impedance Value: 510 Ohm
Lead Channel Pacing Threshold Amplitude: 1 V
Lead Channel Setting Pacing Amplitude: 2 V
Lead Channel Setting Pacing Amplitude: 3.25 V
Lead Channel Setting Pacing Pulse Width: 0.4 ms
Lead Channel Setting Sensing Sensitivity: 4 mV
MDC IDC LEAD IMPLANT DT: 20050825
MDC IDC LEAD LOCATION: 753859
MDC IDC LEAD LOCATION: 753860
MDC IDC MSMT BATTERY IMPEDANCE: 305 Ohm
MDC IDC MSMT LEADCHNL RA PACING THRESHOLD PULSEWIDTH: 0.4 ms
MDC IDC MSMT LEADCHNL RV IMPEDANCE VALUE: 517 Ohm
MDC IDC MSMT LEADCHNL RV PACING THRESHOLD AMPLITUDE: 1.625 V
MDC IDC MSMT LEADCHNL RV PACING THRESHOLD PULSEWIDTH: 0.4 ms
MDC IDC SESS DTM: 20181022125425
MDC IDC STAT BRADY AP VS PERCENT: 0 %
MDC IDC STAT BRADY AS VS PERCENT: 0 %

## 2017-03-14 MED ORDER — PNEUMOCOCCAL 13-VAL CONJ VACC IM SUSP: 0.5000 mL | INTRAMUSCULAR | 0 refills | Status: AC

## 2017-03-14 NOTE — Progress Notes (Signed)
Subjective:   Eugene Long is a 83 y.o. male who presents for an Initial Medicare Annual Wellness Visit at [facility]   Objective:    Today's Vitals   03/14/17 1550  BP: 124/60  Pulse: 71  Temp: 98 F (36.7 C)  TempSrc: Oral  Weight: 150 lb (68 kg)  Height: 5\' 8"  (1.727 m)   Body mass index is 22.81 kg/m.  Current Medications (verified) Outpatient Encounter Prescriptions as of 03/14/2017  Medication Sig  . aspirin 81 MG tablet Take 81 mg by mouth daily.  . cholecalciferol (VITAMIN D) 1000 UNITS tablet Take 2,000 Units by mouth daily.   . [employer] LANCETS THIN 26G MISC CHECK BLOOD SUGAR ONCE A DAY  . finasteride (PROSCAR) 5 MG tablet TAKE 1 TABLET BY MOUTH ONCE DAILY TO TREAT ENLARGED PROSTATE  . loratadine (CLARITIN) 10 MG tablet Take one tablet by mouth at bedtime for allergies  . losartan (COZAAR) 50 MG tablet One daily to control BP and to help protect the kidneys  . metoprolol tartrate (LOPRESSOR) 25 MG tablet TAKE ONE AND ONE-HALF TABLETS TWICE A DAY  . Multiple Vitamins-Minerals (ICAPS PO) Take 1 capsule by mouth daily.   Marland Kitchen omeprazole (PRILOSEC) 40 MG capsule Take 40 mg by mouth every other day.  . ONE TOUCH ULTRA TEST test strip CHECK BLOOD SUGAR ONCE DAILY.  . ONE TOUCH ULTRA TEST test strip CHECK BLOOD SUGAR ONCE DAILY.  . pravastatin (PRAVACHOL) 10 MG tablet One daily to lower cholesterol  . sertraline (ZOLOFT) 50 MG tablet TAKE ONE-HALF (1/2) TABLET EVERY FIVE DAYS TO HELP ANXIETY AND DEPRESSION  . [DISCONTINUED] omeprazole (PRILOSEC) 40 MG capsule Take 1 capsule (40 mg total) by mouth daily.   No facility-administered encounter medications on file as of 03/14/2017.     Allergies (verified) Codeine; Glyburide; Lisinopril; Magnesium oxide; Uroxatral [alfuzosin]; Epinephrine; and Hctz [hydrochlorothiazide]   History: Past Medical History:  Diagnosis Date  . Actinic keratosis   . Acute venous embolism and thrombosis of unspecified deep vessels of  lower extremity   . Anxiety   . Atrioventricular block, complete ([place])   . Benign neoplasm of colon    Polyp  . BPH (benign prostatic hypertrophy)   . Chronic kidney disease, stage II (mild) 12/10/2012  . Complete rupture of rotator cuff   . Contracture of palmar fascia   . Coronary atherosclerosis of native coronary artery   . Depressive disorder, not elsewhere classified   . Diabetes mellitus ([place])   . Diaphragmatic hernia without mention of obstruction or gangrene   . Diverticulosis    colon, Hx of  . Diverticulosis of colon (without mention of hemorrhage)   . Dysphagia, unspecified(787.20)   . Esophageal reflux   . Hypertension   . Hypertrophy of prostate with urinary obstruction and other lower urinary tract symptoms (LUTS)   . Insomnia, unspecified   . Lumbago   . 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 Osteoarthrosis, unspecified whether generalized or localized, unspecified site   . Other and unspecified hyperlipidemia   . Other specified disorder of male genital organs(608.89)    right epididymal cyst  . Peripheral vascular disease, unspecified ([place])   . Stricture and stenosis of esophagus   . Unspecified constipation   . Unspecified hearing loss   . Unspecified pruritic disorder    Past Surgical History:  Procedure Laterality Date  . APPENDECTOMY  1942  . arthoscopy right knee  1980  .  CAROTID ENDARTERECTOMY  1987   right  . CATARACT EXTRACTION  [id number]  . COLONOSCOPY    . COLONOSCOPY  2005   Dr. Sharlett Iles  . ENUCLEATION  02/26/2009   left eye choroidal melanoma  . KNEE ARTHROSCOPY Left 1993  . PACEMAKER GENERATOR CHANGE  08/04/13   Dr. Lovena Le  . PACEMAKER INSERTION  2005; 08/04/2013   Dual chamber MDT pacemaker implanted by Dr Lovena Le in 2005 for CHB; gen change by Dr Lovena Le 07/2013 - MDT [id number]   . PERMANENT PACEMAKER GENERATOR CHANGE N/A 08/04/2013   Procedure: PERMANENT PACEMAKER GENERATOR CHANGE;  Surgeon: Evans Lance,  MD;  Location[facility] CATH LAB;  Service: Cardiovascular;  Laterality: N/A;  . TONSILLECTOMY  1931   Family History  Problem Relation Age of Onset  . Liver cancer Father   . Pancreatic cancer Father   . Cancer Father   . Heart disease Sister        CHF  . Diabetes Paternal Uncle    Social History   Occupational History  . retired Teacher, English as a foreign language Retired   Social History Main Topics  . Smoking status: Former Smoker    Packs/day: 0.50    Years: 17.00    Types: Cigarettes    Quit date: 05/22/1960  . Smokeless tobacco: Never Used  . Alcohol use No  . Drug use: No  . Sexual activity: No   Tobacco Counseling Counseling given: Not Answered   Activities of Daily Living In your present state of health, do you have any difficulty performing the following activities: 03/14/2017  Hearing? N  Vision? Y  Difficulty concentrating or making decisions? N  Walking or climbing stairs? N  Dressing or bathing? N  Doing errands, shopping? N  Preparing Food and eating ? N  Using the Toilet? N  In the past six months, have you accidently leaked urine? N  Do you have problems with loss of bowel control? N  Managing your Medications? N  Managing your Finances? N  Housekeeping or managing your Housekeeping? N  Some recent data might be hidden    Immunizations and Health Maintenance Immunization History  Administered Date(s) Administered  . Influenza Whole 02/20/2012, 02/20/2013  . Influenza, High Dose Seasonal PF 02/28/2017  . Influenza-Unspecified 03/05/2014, 02/18/2015, 03/02/2016  . Pneumococcal Polysaccharide-23 05/22/2005  . Td 05/22/1997   Health Maintenance Due  Topic Date Due  . PNA vac Low Risk Adult (2 of 2 - PCV13) 05/22/2006  . TETANUS/TDAP  05/23/2007  . FOOT EXAM  11/26/2014    Patient Care Team: Blanchie Serve, MD as PCP - General (Internal Medicine) Shon Hough, MD as Consulting Physician (Ophthalmology) Irine Seal, MD as Consulting Physician  (Urology) Sable Feil, MD as Consulting Physician (Gastroenterology) Gerarda Fraction, MD as Consulting Physician (Ophthalmology) [place] Evans Lance, MD (Cardiology) Sable Feil, MD as Consulting Physician (Gastroenterology) Rosendo Gros, Marnee Spring (Inactive) as Consulting Physician (Ophthalmology) Ngetich, Nelda Bucks, NP as Nurse Practitioner (Family Medicine)  Indicate any recent Medical Services you may have received from other than [facility] providers in the past year (date may be approximate).    Assessment:   This is a routine wellness examination for Eugene Long.    Hearing/Vision screen No exam data present  Dietary issues and exercise activities discussed: Current Exercise Habits: Home exercise routine, Type of exercise: walking, Time (Minutes): 20, Frequency (Times/Week): 7, Weekly Exercise (Minutes/Week): 140, Intensity: Mild, Exercise limited by: None identified  Goals    . Maintain LIfestyle  Starting today pt will maintain lifestyle.       Depression Screen PHQ 2/9 Scores 03/14/2017 02/14/2017 12/07/2015 11/25/2013  PHQ - 2 Score 0 0 0 0    Fall Risk Fall Risk  03/14/2017 02/14/2017 12/07/2015 06/08/2015 08/04/2014  Falls in the past year? No No No No No  Risk for fall due to : - Medication side effect - - -    Cognitive Function: MMSE - Mini Mental State Exam 03/14/2017  Orientation to time 5  Orientation to Place 5  Registration 3  Attention/ Calculation 5  Recall 2  Language- name 2 objects 2  Language- repeat 1  Language- follow 3 step command 3  Language- read & follow direction 1  Write a sentence 1  Copy design 1  Total score 29        Screening Tests Health Maintenance  Topic Date Due  . PNA vac Low Risk Adult (2 of 2 - PCV13) 05/22/2006  . TETANUS/TDAP  05/23/2007  . FOOT EXAM  11/26/2014  . HEMOGLOBIN A1C  08/05/2017  . OPHTHALMOLOGY EXAM  12/21/2017  . INFLUENZA VACCINE  Completed        Plan:    I have  personally reviewed and addressed the Medicare Annual Wellness questionnaire and have noted the following in the patient's chart:  A. Medical and social history B. Use of alcohol, tobacco or illicit drugs  C. Current medications and supplements D. Functional ability and status E.  Nutritional status F.  Physical activity G. Advance directives H. List of other physicians I.  Hospitalizations, surgeries, and ER visits in previous 12 months J.  [place] to include hearing, vision, cognitive, depression L. Referrals and appointments - none  In addition, I have reviewed and discussed with patient certain preventive protocols, quality metrics, and best practice recommendations. A written personalized care plan for preventive services as well as general preventive health recommendations were provided to patient.  See attached scanned questionnaire for additional information.   Signed,   Rich Reining, RN Nurse Health Advisor   Quick Notes   Health Maintenance: Foot exam due. Prevnar prescription sent to pharmacy     Abnormal Screen: MMSE 29/30. Passed clock drawing     Patient Concerns: C/o back arthritis pain, stinging/burning in feet for a couple months. FYI checks blood sugars every day and when it gets above 140 he takes half a pill of glimepiride.  Patient unsure if he needs to be seen before March 2019.    Nurse Concerns: none

## 2017-03-14 NOTE — Patient Instructions (Signed)
Eugene Long , Thank you for taking time to come for your Medicare Wellness Visit. I appreciate your ongoing commitment to your health goals. Please review the following plan we discussed and let me know if I can assist you in the future.   Screening recommendations/referrals: Colonoscopy excluded, you are over age 81 Recommended yearly ophthalmology/optometry visit for glaucoma screening and checkup Recommended yearly dental visit for hygiene and checkup  Vaccinations: Influenza vaccine up to date. Due 2019 fall season Pneumococcal vaccine  Tdap vaccine due, declined Shingles vaccine due, you have never had chicken pox and do not need this vaccine    Advanced directives: In Chart  Conditions/risks identified: none  Next appointment: Dr. Bubba Camp 07/25/2016 @ 11:30am  Preventive Care 65 Years and Older, Male Preventive care refers to lifestyle choices and visits with your health care provider that can promote health and wellness. What does preventive care include?  A yearly physical exam. This is also called an annual well check.  Dental exams once or twice a year.  Routine eye exams. Ask your health care provider how often you should have your eyes checked.  Personal lifestyle choices, including:  Daily care of your teeth and gums.  Regular physical activity.  Eating a healthy diet.  Avoiding tobacco and drug use.  Limiting alcohol use.  Practicing safe sex.  Taking low doses of aspirin every day.  Taking vitamin and mineral supplements as recommended by your health care provider. What happens during an annual well check? The services and screenings done by your health care provider during your annual well check will depend on your age, overall health, lifestyle risk factors, and family history of disease. Counseling  Your health care provider may ask you questions about your:  Alcohol use.  Tobacco use.  Drug use.  Emotional well-being.  Home and relationship  well-being.  Sexual activity.  Eating habits.  History of falls.  Memory and ability to understand (cognition).  Work and work Statistician. Screening  You may have the following tests or measurements:  Height, weight, and BMI.  Blood pressure.  Lipid and cholesterol levels. These may be checked every 5 years, or more frequently if you are over 32 years old.  Skin check.  Lung cancer screening. You may have this screening every year starting at age 26 if you have a 30-pack-year history of smoking and currently smoke or have quit within the past 15 years.  Fecal occult blood test (FOBT) of the stool. You may have this test every year starting at age 67.  Flexible sigmoidoscopy or colonoscopy. You may have a sigmoidoscopy every 5 years or a colonoscopy every 10 years starting at age 58.  Prostate cancer screening. Recommendations will vary depending on your family history and other risks.  Hepatitis C blood test.  Hepatitis B blood test.  Sexually transmitted disease (STD) testing.  Diabetes screening. This is done by checking your blood sugar (glucose) after you have not eaten for a while (fasting). You may have this done every 1-3 years.  Abdominal aortic aneurysm (AAA) screening. You may need this if you are a current or former smoker.  Osteoporosis. You may be screened starting at age 44 if you are at high risk. Talk with your health care provider about your test results, treatment options, and if necessary, the need for more tests. Vaccines  Your health care provider may recommend certain vaccines, such as:  Influenza vaccine. This is recommended every year.  Tetanus, diphtheria, and acellular pertussis (Tdap,  Td) vaccine. You may need a Td booster every 10 years.  Zoster vaccine. You may need this after age 81.  Pneumococcal 13-valent conjugate (PCV13) vaccine. One dose is recommended after age 74.  Pneumococcal polysaccharide (PPSV23) vaccine. One dose is  recommended after age 40. Talk to your health care provider about which screenings and vaccines you need and how often you need them. This information is not intended to replace advice given to you by your health care provider. Make sure you discuss any questions you have with your health care provider. Document Released: 06/04/2015 Document Revised: 01/26/2016 Document Reviewed: 03/09/2015 Elsevier Interactive Patient Education  2017 [place] Prevention in the Home Falls can cause injuries. They can happen to people of all ages. There are many things you can do to make your home safe and to help prevent falls. What can I do on the outside of my home?  Regularly fix the edges of walkways and driveways and fix any cracks.  Remove anything that might make you trip as you walk through a door, such as a raised step or threshold.  Trim any bushes or trees on the path to your home.  Use bright outdoor lighting.  Clear any walking paths of anything that might make someone trip, such as rocks or tools.  Regularly check to see if handrails are loose or broken. Make sure that both sides of any steps have handrails.  Any raised decks and porches should have guardrails on the edges.  Have any leaves, snow, or ice cleared regularly.  Use sand or salt on walking paths during winter.  Clean up any spills in your garage right away. This includes oil or grease spills. What can I do in the bathroom?  Use night lights.  Install grab bars by the toilet and in the tub and shower. Do not use towel bars as grab bars.  Use non-skid mats or decals in the tub or shower.  If you need to sit down in the shower, use a plastic, non-slip stool.  Keep the floor dry. Clean up any water that spills on the floor as soon as it happens.  Remove soap buildup in the tub or shower regularly.  Attach bath mats securely with double-sided non-slip rug tape.  Do not have throw rugs and other things on  the floor that can make you trip. What can I do in the bedroom?  Use night lights.  Make sure that you have a light by your bed that is easy to reach.  Do not use any sheets or blankets that are too big for your bed. They should not hang down onto the floor.  Have a firm chair that has side arms. You can use this for support while you get dressed.  Do not have throw rugs and other things on the floor that can make you trip. What can I do in the kitchen?  Clean up any spills right away.  Avoid walking on wet floors.  Keep items that you use a lot in easy-to-reach places.  If you need to reach something above you, use a strong step stool that has a grab bar.  Keep electrical cords out of the way.  Do not use floor polish or wax that makes floors slippery. If you must use wax, use non-skid floor wax.  Do not have throw rugs and other things on the floor that can make you trip. What can I do with my stairs?  Do not  leave any items on the stairs.  Make sure that there are handrails on both sides of the stairs and use them. Fix handrails that are broken or loose. Make sure that handrails are as long as the stairways.  Check any carpeting to make sure that it is firmly attached to the stairs. Fix any carpet that is loose or worn.  Avoid having throw rugs at the top or bottom of the stairs. If you do have throw rugs, attach them to the floor with carpet tape.  Make sure that you have a light switch at the top of the stairs and the bottom of the stairs. If you do not have them, ask someone to add them for you. What else can I do to help prevent falls?  Wear shoes that:  Do not have high heels.  Have rubber bottoms.  Are comfortable and fit you well.  Are closed at the toe. Do not wear sandals.  If you use a stepladder:  Make sure that it is fully opened. Do not climb a closed stepladder.  Make sure that both sides of the stepladder are locked into place.  Ask someone to  hold it for you, if possible.  Clearly mark and make sure that you can see:  Any grab bars or handrails.  First and last steps.  Where the edge of each step is.  Use tools that help you move around (mobility aids) if they are needed. These include:  Canes.  Walkers.  Scooters.  Crutches.  Turn on the lights when you go into a dark area. Replace any light bulbs as soon as they burn out.  Set up your furniture so you have a clear path. Avoid moving your furniture around.  If any of your floors are uneven, fix them.  If there are any pets around you, be aware of where they are.  Review your medicines with your doctor. Some medicines can make you feel dizzy. This can increase your chance of falling. Ask your doctor what other things that you can do to help prevent falls. This information is not intended to replace advice given to you by your health care provider. Make sure you discuss any questions you have with your health care provider. Document Released: 03/04/2009 Document Revised: 10/14/2015 Document Reviewed: 06/12/2014 Elsevier Interactive Patient Education  2017 [employer].

## 2017-03-16 ENCOUNTER — Encounter: Payer: Self-pay | Admitting: Cardiology

## 2017-03-24 ENCOUNTER — Other Ambulatory Visit: Payer: Self-pay | Admitting: Internal Medicine

## 2017-04-10 ENCOUNTER — Encounter: Payer: Medicare Other | Admitting: Internal Medicine

## 2017-05-06 ENCOUNTER — Other Ambulatory Visit: Payer: Self-pay | Admitting: Internal Medicine

## 2017-05-19 ENCOUNTER — Other Ambulatory Visit: Payer: Self-pay | Admitting: Internal Medicine

## 2017-05-19 DIAGNOSIS — F32A Depression, unspecified: Secondary | ICD-10-CM

## 2017-05-19 DIAGNOSIS — F329 Major depressive disorder, single episode, unspecified: Secondary | ICD-10-CM

## 2017-06-11 ENCOUNTER — Ambulatory Visit (INDEPENDENT_AMBULATORY_CARE_PROVIDER_SITE_OTHER): Payer: Medicare Other | Admitting: *Deleted

## 2017-06-11 DIAGNOSIS — I442 Atrioventricular block, complete: Secondary | ICD-10-CM | POA: Diagnosis not present

## 2017-06-11 NOTE — Progress Notes (Signed)
Remote pacemaker transmission.   

## 2017-06-12 ENCOUNTER — Encounter: Payer: Self-pay | Admitting: Cardiology

## 2017-06-12 LAB — CUP PACEART REMOTE DEVICE CHECK
Battery Impedance: 354 Ohm
Battery Remaining Longevity: 81 mo
Battery Voltage: 2.77 V
Brady Statistic AP VP Percent: 78 %
Brady Statistic AP VS Percent: 0 %
Brady Statistic AS VP Percent: 22 %
Date Time Interrogation Session: 20190121140420
Implantable Lead Implant Date: 20050825
Implantable Lead Location: 753859
Implantable Lead Model: 5076
Implantable Lead Model: 5076
Implantable Pulse Generator Implant Date: 20150316
Lead Channel Impedance Value: 515 Ohm
Lead Channel Impedance Value: 524 Ohm
Lead Channel Pacing Threshold Amplitude: 0.875 V
Lead Channel Pacing Threshold Amplitude: 1.625 V
Lead Channel Pacing Threshold Pulse Width: 0.4 ms
Lead Channel Pacing Threshold Pulse Width: 0.4 ms
Lead Channel Setting Pacing Amplitude: 2 V
Lead Channel Setting Pacing Pulse Width: 0.46 ms
MDC IDC LEAD IMPLANT DT: 20050825
MDC IDC LEAD LOCATION: 753860
MDC IDC SET LEADCHNL RV PACING AMPLITUDE: 3.25 V
MDC IDC SET LEADCHNL RV SENSING SENSITIVITY: 4 mV
MDC IDC STAT BRADY AS VS PERCENT: 0 %

## 2017-06-16 ENCOUNTER — Other Ambulatory Visit: Payer: Self-pay | Admitting: Internal Medicine

## 2017-06-16 DIAGNOSIS — I10 Essential (primary) hypertension: Secondary | ICD-10-CM

## 2017-06-18 ENCOUNTER — Other Ambulatory Visit: Payer: Self-pay | Admitting: Internal Medicine

## 2017-07-16 DIAGNOSIS — N183 Chronic kidney disease, stage 3 unspecified: Secondary | ICD-10-CM

## 2017-07-16 DIAGNOSIS — I129 Hypertensive chronic kidney disease with stage 1 through stage 4 chronic kidney disease, or unspecified chronic kidney disease: Secondary | ICD-10-CM

## 2017-07-16 DIAGNOSIS — E1122 Type 2 diabetes mellitus with diabetic chronic kidney disease: Secondary | ICD-10-CM

## 2017-07-16 DIAGNOSIS — I10 Essential (primary) hypertension: Secondary | ICD-10-CM

## 2017-07-16 LAB — HEMOGLOBIN A1C
Hgb A1c MFr Bld: 6.1 % of total Hgb — ABNORMAL HIGH (ref <5.7)
Mean Plasma Glucose: 128 (calc)
eAG (mmol/L): 7.1 (calc)

## 2017-07-16 LAB — COMPLETE METABOLIC PANEL WITH GFR
AG RATIO: 1.8 (calc) (ref 1.0–2.5)
ALT: 40 U/L (ref 9–46)
AST: 41 U/L — ABNORMAL HIGH (ref 10–35)
Albumin: 4 g/dL (ref 3.6–5.1)
Alkaline phosphatase (APISO): 79 U/L (ref 40–115)
BILIRUBIN TOTAL: 0.8 mg/dL (ref 0.2–1.2)
BUN / CREAT RATIO: 17 (calc) (ref 6–22)
BUN: 27 mg/dL — ABNORMAL HIGH (ref 7–25)
CO2: 26 mmol/L (ref 20–32)
Calcium: 9.7 mg/dL (ref 8.6–10.3)
Chloride: 101 mmol/L (ref 98–110)
Creat: 1.62 mg/dL — ABNORMAL HIGH (ref 0.70–1.11)
GFR, EST AFRICAN AMERICAN: 41 mL/min/{1.73_m2} — AB (ref >=60)
GFR, EST NON AFRICAN AMERICAN: 35 mL/min/{1.73_m2} — AB (ref >=60)
GLOBULIN: 2.2 g/dL (ref 1.9–3.7)
Glucose, Bld: 104 mg/dL — ABNORMAL HIGH (ref 65–99)
POTASSIUM: 4.7 mmol/L (ref 3.5–5.3)
SODIUM: 137 mmol/L (ref 135–146)
TOTAL PROTEIN: 6.2 g/dL (ref 6.1–8.1)

## 2017-07-25 ENCOUNTER — Non-Acute Institutional Stay: Payer: Medicare Other | Admitting: Internal Medicine

## 2017-07-25 ENCOUNTER — Encounter: Payer: Self-pay | Admitting: Internal Medicine

## 2017-07-25 VITALS — BP 122/60 | HR 60 | Temp 97.5°F | Resp 16 | Ht 68.0 in | Wt 144.8 lb

## 2017-07-25 DIAGNOSIS — R3912 Poor urinary stream: Secondary | ICD-10-CM

## 2017-07-25 DIAGNOSIS — I129 Hypertensive chronic kidney disease with stage 1 through stage 4 chronic kidney disease, or unspecified chronic kidney disease: Secondary | ICD-10-CM

## 2017-07-25 DIAGNOSIS — K219 Gastro-esophageal reflux disease without esophagitis: Secondary | ICD-10-CM | POA: Diagnosis not present

## 2017-07-25 DIAGNOSIS — E1122 Type 2 diabetes mellitus with diabetic chronic kidney disease: Secondary | ICD-10-CM | POA: Diagnosis not present

## 2017-07-25 DIAGNOSIS — I2510 Atherosclerotic heart disease of native coronary artery without angina pectoris: Secondary | ICD-10-CM | POA: Diagnosis not present

## 2017-07-25 DIAGNOSIS — K449 Diaphragmatic hernia without obstruction or gangrene: Secondary | ICD-10-CM

## 2017-07-25 DIAGNOSIS — F3289 Other specified depressive episodes: Secondary | ICD-10-CM | POA: Diagnosis not present

## 2017-07-25 DIAGNOSIS — I739 Peripheral vascular disease, unspecified: Secondary | ICD-10-CM

## 2017-07-25 DIAGNOSIS — E1151 Type 2 diabetes mellitus with diabetic peripheral angiopathy without gangrene: Secondary | ICD-10-CM

## 2017-07-25 DIAGNOSIS — N401 Enlarged prostate with lower urinary tract symptoms: Secondary | ICD-10-CM

## 2017-07-25 DIAGNOSIS — N183 Chronic kidney disease, stage 3 unspecified: Secondary | ICD-10-CM

## 2017-07-25 DIAGNOSIS — I10 Essential (primary) hypertension: Secondary | ICD-10-CM

## 2017-07-25 DIAGNOSIS — K439 Ventral hernia without obstruction or gangrene: Secondary | ICD-10-CM

## 2017-07-25 NOTE — Progress Notes (Signed)
[facility]  Provider: Blanchie Serve MD   Location:  [place]   Place of Service:  Clinic (12)  PCP: Blanchie Serve, MD Patient Care Team: Blanchie Serve, MD as PCP - General (Internal Medicine) Shon Hough, MD as Consulting Physician (Ophthalmology) Irine Seal, MD as Consulting Physician (Urology) Sable Feil, MD as Consulting Physician (Gastroenterology) Gerarda Fraction, MD as Consulting Physician (Ophthalmology) [place], [facility] Evans Lance, MD (Cardiology) Sable Feil, MD as Consulting Physician (Gastroenterology) Rosendo Gros, Marnee Spring (Inactive) as Consulting Physician (Ophthalmology) Ngetich, Nelda Bucks, NP as Nurse Practitioner (Family Medicine)  Extended Emergency Contact Information Primary Emergency Contact: Carmell [place], [place] Phone: [phone] Relation: Daughter   Goals of Care: Advanced Directive information Advanced Directives 03/14/2017  Does Patient Have a Medical Advance Directive? Yes  Type of Advance Directive [place]  Does patient want to make changes to medical advance directive? No - Patient declined  Copy of [place] in Chart? Yes      Chief Complaint  Patient presents with  . Medical Management of Chronic Issues    6 month follow up  . Medication Refill    No refills needed at this time  . Results    Discuss labs    HPI: Patient is a 84 y.o. male seen today for routine follow up visit. He feels a knot to middle of his stomach for 2 weeks now. Denies pain. He also has some discomfort to right groin area with cough and deep breathing. He has concerns about leg swelling and developing trouble with breathing with activity.   Depression- currently on sertraline 25 mg every 5 days, this has been helping with his mood.   Hypertension- currently takes metoprolol tartrate 37.5 mg bid, losartan 50 mg daily  with baby aspirin. Reviewed BP reading from home SBP between 110-150 and DBP 54-75. Denies any headache or dizziness   gerd- with history of hiatal hernia. Currently takes omeprazole 40 mg daily  DM type 2 with neuropathy- controlled. Off glimeperide. Currently on losaratan.   BPH- has urinary symptom. Takes finasteride.    Past Medical History:  Diagnosis Date  . Actinic keratosis   . Acute venous embolism and thrombosis of unspecified deep vessels of lower extremity   . Anxiety   . Atrioventricular block, complete ([place])   . Benign neoplasm of colon    Polyp  . BPH (benign prostatic hypertrophy)   . Chronic kidney disease, stage II (mild) 12/10/2012  . Complete rupture of rotator cuff   . Contracture of palmar fascia   . Coronary atherosclerosis of native coronary artery   . Depressive disorder, not elsewhere classified   . Diabetes mellitus ([place])   . Diaphragmatic hernia without mention of obstruction or gangrene   . Diverticulosis    colon, Hx of  . Diverticulosis of colon (without mention of hemorrhage)   . Dysphagia, unspecified(787.20)   . Esophageal reflux   . Hypertension   . Hypertrophy of prostate with urinary obstruction and other lower urinary tract symptoms (LUTS)   . Insomnia, unspecified   . Lumbago   . 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 Osteoarthrosis, unspecified whether generalized or localized, unspecified site   . Other and unspecified hyperlipidemia   . Other specified disorder of male genital organs(608.89)    right epididymal  cyst  . Peripheral vascular disease, unspecified ([place])   . Stricture and stenosis of esophagus   . Unspecified constipation   . Unspecified hearing loss   . Unspecified pruritic disorder    Past Surgical History:  Procedure Laterality Date  . APPENDECTOMY  1942  . arthoscopy right knee  1980  . CAROTID ENDARTERECTOMY  1987   right  . CATARACT EXTRACTION  [id number]  . COLONOSCOPY    .  COLONOSCOPY  2005   Dr. Sharlett Iles  . ENUCLEATION  02/26/2009   left eye choroidal melanoma  . KNEE ARTHROSCOPY Left 1993  . PACEMAKER GENERATOR CHANGE  08/04/13   Dr. Lovena Le  . PACEMAKER INSERTION  2005; 08/04/2013   Dual chamber MDT pacemaker implanted by Dr Lovena Le in 2005 for CHB; gen change by Dr Lovena Le 07/2013 - MDT ADDRL1   . PERMANENT PACEMAKER GENERATOR CHANGE N/A 08/04/2013   Procedure: PERMANENT PACEMAKER GENERATOR CHANGE;  Surgeon: Evans Lance, MD;  Location: [facility];  Service: Cardiovascular;  Laterality: N/A;  . TONSILLECTOMY  1931    reports that he quit smoking about 57 years ago. His smoking use included cigarettes. He has a 8.50 pack-year smoking history. he has never used smokeless tobacco. He reports that he does not drink alcohol or use drugs. Social History   Socioeconomic History  . Marital status: Married    Spouse name: Not on file  . Number of children: 3  . Years of education: Not on file  . Highest education level: Not on file  Social Needs  . Financial resource strain: Not on file  . Food insecurity - worry: Not on file  . Food insecurity - inability: Not on file  . Transportation needs - medical: Not on file  . Transportation needs - non-medical: Not on file  Occupational History  . Occupation: retired Pharmacist, community: RETIRED  Tobacco Use  . Smoking status: Former Smoker    Packs/day: 0.50    Years: 17.00    Pack years: 8.50    Types: Cigarettes    Last attempt to quit: 05/22/1960    Years since quitting: 57.2  . Smokeless tobacco: Never Used  Substance and Sexual Activity  . Alcohol use: No  . Drug use: No  . Sexual activity: No  Other Topics Concern  . Not on file  Social History Narrative   Patient [facility] at [facility] since 2003.   Widowed   Living will, POA   In [place]   Pacemaker   Previously worked in Research officer, trade union   Exercise walks 15-20 minutes twice a day most days     Family History    Problem Relation Age of Onset  . Liver cancer Father   . Pancreatic cancer Father   . Cancer Father   . Heart disease Sister        CHF  . Diabetes Paternal Uncle     Health Maintenance  Topic Date Due  . PNA vac Low Risk Adult (2 of 2 - PCV13) 05/22/2006  . TETANUS/TDAP  05/23/2007  . FOOT EXAM  11/26/2014  . OPHTHALMOLOGY EXAM  12/21/2017  . HEMOGLOBIN A1C  01/13/2018  . INFLUENZA VACCINE  Completed    Allergies  Allergen Reactions  . Codeine Other (See Comments)    Runny eyes and nose,  Teary eyes  . Glyburide Other (See Comments)    Unknown  . Lisinopril Itching  . Magnesium Oxide  Rash, itching  . Uroxatral [Alfuzosin] Other (See Comments)    Heart races  . Epinephrine Palpitations  . Hctz [Hydrochlorothiazide] Rash    Outpatient Encounter Medications as of 07/25/2017  Medication Sig  . acetaminophen (TYLENOL) 500 MG tablet Take 500 mg by mouth as needed (low back pain).  Marland Kitchen aspirin 81 MG tablet Take 81 mg by mouth daily.  . cholecalciferol (VITAMIN D) 1000 UNITS tablet Take 2,000 Units by mouth daily.   . CVS LANCETS THIN 26G MISC CHECK BLOOD SUGAR ONCE A DAY  . docusate sodium (COLACE) 100 MG capsule Take 100 mg by mouth daily.  . finasteride (PROSCAR) 5 MG tablet TAKE 1 TABLET ONCE DAILY TO TREAT ENLARGED PROSTATE  . losartan (COZAAR) 50 MG tablet TAKE 1 TABLET DAILY TO CONTROL BLOOD PRESSURE AND TO HELP PROTECT THE KIDNEYS  . metoprolol tartrate (LOPRESSOR) 25 MG tablet TAKE ONE AND ONE-HALF TABLETS TWICE A DAY  . Multiple Vitamins-Minerals (ICAPS PO) Take 1 capsule by mouth daily.   Marland Kitchen omeprazole (PRILOSEC) 40 MG capsule Take 40 mg by mouth daily.   . ONE TOUCH ULTRA TEST test strip CHECK BLOOD SUGAR ONCE DAILY.  . ONE TOUCH ULTRA TEST test strip CHECK BLOOD SUGAR ONCE DAILY.  . pravastatin (PRAVACHOL) 10 MG tablet One daily to lower cholesterol  . sertraline (ZOLOFT) 50 MG tablet TAKE ONE-HALF (1/2) TABLET EVERY FIVE DAYS TO HELP ANXIETY AND DEPRESSION  .  loratadine (CLARITIN) 10 MG tablet Take one tablet by mouth at bedtime for allergies   No facility-administered encounter medications on file as of 07/25/2017.     Review of Systems  Constitutional: Negative for chills and fever.  HENT: Positive for hearing loss.        Has hearing aids  Eyes: Positive for visual disturbance. Negative for pain and itching.  Respiratory: Positive for shortness of breath. Negative for cough and choking.        Some dyspnea with walking fast and moderate exertion, none at rest, has not limited his ADLs and IADLs.   Cardiovascular: Positive for leg swelling. Negative for chest pain and palpitations.       Has noticed swelling to both legs for few months. Swelling is present all the time. Denies any drainage or skin breakdown in legs  Gastrointestinal: Positive for constipation. Negative for abdominal pain, blood in stool, diarrhea, nausea and vomiting.       Takes stool softener.this helps. Denies straining. Has history of diverticulosis. Was having discomfort to RLQ for few months on and off and he realized he was taking good amount of nuts in his meals, has cut down and this has helped with his symptom.   Genitourinary: Positive for difficulty urinating and frequency. Negative for dysuria and hematuria.  Musculoskeletal: Positive for back pain. Negative for arthralgias.       Balance is good, no fall reported  Neurological: Positive for light-headedness and numbness. Negative for dizziness, tremors, syncope and headaches.       Occasional numbness to toes and ankle area  Hematological: Bruises/bleeds easily.  Psychiatric/Behavioral: Negative for confusion, hallucinations and sleep disturbance.    Vitals:   07/25/17 1139  BP: 122/60  Pulse: 60  Resp: 16  Temp: (!) 97.5 F (36.4 C)  TempSrc: Oral  SpO2: 99%  Weight: 144 lb 12.8 oz (65.7 kg)  Height: 5' 8"  (1.727 m)   Body mass index is 22.02 kg/m.   Wt Readings from Last 3 Encounters:  07/25/17 144  lb 12.8 oz (65.7  kg)  03/14/17 150 lb (68 kg)  02/14/17 149 lb 9.6 oz (67.9 kg)   Physical Exam  Constitutional: He is oriented to person, place, and time. He appears well-developed and well-nourished. No distress.  HENT:  Head: Normocephalic and atraumatic.  Right Ear: External ear normal.  Left Ear: External ear normal.  Nose: Nose normal.  Mouth/Throat: Oropharynx is clear and moist. No oropharyngeal exudate.  Hearing aid present  Eyes: Conjunctivae and EOM are normal. Pupils are equal, round, and reactive to light. Right eye exhibits no discharge. Left eye exhibits no discharge.  Neck: Normal range of motion. Neck supple.  Cardiovascular: Normal rate, regular rhythm and intact distal pulses.  Pulmonary/Chest: Effort normal and breath sounds normal. He has no wheezes. He has no rales.  Abdominal: Soft. Bowel sounds are normal. There is no tenderness. There is no rebound and no guarding.  Abdominal wall hernia +  Musculoskeletal: He exhibits edema.  1+ edema to both feet and legs, able to move all 4 extremities  Lymphadenopathy:    He has no cervical adenopathy.  Neurological: He is alert and oriented to person, place, and time.  Skin: Skin is warm and dry. He is not diaphoretic.  Psychiatric: He has a normal mood and affect. His behavior is normal.     Labs reviewed: Basic Metabolic Panel: Recent Labs    07/31/16 0750 02/05/17 0800 07/16/17 0000  NA 139 138 137  K 4.5 4.5 4.7  CL 103 103 101  CO2 28 28 26   GLUCOSE 85 87 104*  BUN 30* 31* 27*  CREATININE 1.74* 1.62* 1.62*  CALCIUM 9.2 9.5 9.7   Liver Function Tests: Recent Labs    02/05/17 0800 07/16/17 0000  AST 16 41*  ALT 10 40  BILITOT 0.7 0.8  PROT 6.3 6.2   No results for input(s): LIPASE, AMYLASE in the last 8760 hours. No results for input(s): AMMONIA in the last 8760 hours. CBC: No results for input(s): WBC, NEUTROABS, HGB, HCT, MCV, PLT in the last 8760 hours. Cardiac Enzymes: No results for  input(s): CKTOTAL, CKMB, CKMBINDEX, TROPONINI in the last 8760 hours. BNP: Invalid input(s): POCBNP Lab Results  Component Value Date   HGBA1C 6.1 (H) 07/16/2017   No results found for: TSH No results found for: VITAMINB12 No results found for: FOLATE No results found for: IRON, TIBC, FERRITIN  Lipid Panel: Recent Labs    07/31/16 0750 02/05/17 0800  CHOL 127 135  HDL 49 49  LDLCALC 62  --   TRIG 81 89  CHOLHDL 2.6 2.8   Lab Results  Component Value Date   HGBA1C 6.1 (H) 07/16/2017    Procedures since last visit: No results found.  Assessment/Plan  1. Essential hypertension Controlled. Continue metoprolol and losartan, no changes made - CMP with eGFR([facility]); Future - CBC (no diff); Future - Lipid Panel; Future  2. Type 2 DM with CKD stage 3 and hypertension (HCC)  - CMP with eGFR([facility]); Future - CBC (no diff); Future - Lipid Panel; Future - Hemoglobin A1c; Future  3. PERIPHERAL VASCULAR DISEASE Keep legs elevated at rest. Compression stockings advised. If no improvement, add a low dose of diuretic.   4. Atherosclerosis of native coronary artery of native heart without angina pectoris C/w ARB, b blcoker, statin and aspirin, monitor  5. DM (diabetes mellitus), type 2 with peripheral vascular complications (HCC) Diet controlled. a1c reviewed. Off medication. Continue losartan and aspirin. Advised to wear compression stockings to help with leg edema. Skin care  for legs. Script for compressions stocking provided. uptodate with foot exam.  - Lipid Panel; Future - Hemoglobin A1c; Future  6. Hiatal hernia with GERD Continue omeprazole 40 mg daily.   7. CKD (chronic kidney disease) stage 3, GFR 30-59 ml/min (HCC) Reviewed renal function. No NSAIDs. Maintain hydration - CMP with eGFR([facility]); Future  8. Benign prostatic hyperplasia with weak urinary stream Has urinary complaints but tolerable, c/w finasteride  9. Other depression Continue sertraline 25 mg  every 5 days, patient does not want any changes made. Continue current regimen  10. Abdominal wall hernia No signs of obstruction or gangrene. Supportive care.   Labs/tests ordered:   Lab Orders     CMP with eGFR([facility])     CBC (no diff)     Lipid Panel     Hemoglobin A1c  Next appointment: 6 months for physical with [facility]  Communication: reviewed care plan with patient.     Blanchie Serve, MD Internal Medicine [facility] [address] [place], [place] [address] Cell Phone (Monday-Friday 8 am - 5 pm): [phone] On Call: [phone] and follow prompts after 5 pm and on weekends Office Phone: [phone] Office Fax: [phone]

## 2017-07-25 NOTE — Patient Instructions (Signed)

## 2017-08-13 ENCOUNTER — Telehealth: Payer: Self-pay | Admitting: *Deleted

## 2017-08-13 DIAGNOSIS — I10 Essential (primary) hypertension: Secondary | ICD-10-CM

## 2017-08-13 MED ORDER — CANDESARTAN CILEXETIL 8 MG PO TABS: 8.0000 mg | ORAL_TABLET | Freq: Every day | ORAL | 3 refills | Status: DC

## 2017-08-13 NOTE — Telephone Encounter (Signed)
Patient called and stated that he would like an alternative for his recalled Losartan. Please Advise.

## 2017-08-13 NOTE — Telephone Encounter (Signed)
   will change losartan dosing to equivalent dosing of candesartan 8 mg daily. [facility] after [place] spoke with patient.

## 2017-08-17 ENCOUNTER — Encounter: Payer: Self-pay | Admitting: Internal Medicine

## 2017-08-17 ENCOUNTER — Ambulatory Visit (INDEPENDENT_AMBULATORY_CARE_PROVIDER_SITE_OTHER): Payer: Medicare Other | Admitting: Internal Medicine

## 2017-08-17 VITALS — BP 142/50 | HR 64 | Ht 68.0 in | Wt 143.0 lb

## 2017-08-17 DIAGNOSIS — Z950 Presence of cardiac pacemaker: Secondary | ICD-10-CM | POA: Diagnosis not present

## 2017-08-17 DIAGNOSIS — I442 Atrioventricular block, complete: Secondary | ICD-10-CM | POA: Diagnosis not present

## 2017-08-17 DIAGNOSIS — I10 Essential (primary) hypertension: Secondary | ICD-10-CM

## 2017-08-17 LAB — CUP PACEART INCLINIC DEVICE CHECK
Brady Statistic AP VS Percent: 0 %
Brady Statistic AS VP Percent: 26 %
Implantable Lead Implant Date: 20050825
Implantable Lead Location: 753860
Implantable Lead Model: 5076
Lead Channel Impedance Value: 471 Ohm
Lead Channel Impedance Value: 481 Ohm
Lead Channel Pacing Threshold Amplitude: 0.75 V
Lead Channel Pacing Threshold Amplitude: 1.5 V
Lead Channel Pacing Threshold Amplitude: 1.625 V
Lead Channel Pacing Threshold Pulse Width: 0.4 ms
Lead Channel Pacing Threshold Pulse Width: 0.4 ms
Lead Channel Sensing Intrinsic Amplitude: 4 mV
Lead Channel Setting Pacing Amplitude: 2 V
Lead Channel Setting Pacing Amplitude: 3.25 V
Lead Channel Setting Sensing Sensitivity: 4 mV
MDC IDC LEAD IMPLANT DT: 20050825
MDC IDC LEAD LOCATION: 753859
MDC IDC MSMT BATTERY IMPEDANCE: 377 Ohm
MDC IDC MSMT BATTERY REMAINING LONGEVITY: 80 mo
MDC IDC MSMT BATTERY VOLTAGE: 2.77 V
MDC IDC MSMT LEADCHNL RA PACING THRESHOLD AMPLITUDE: 1 V
MDC IDC MSMT LEADCHNL RA PACING THRESHOLD PULSEWIDTH: 0.4 ms
MDC IDC MSMT LEADCHNL RV PACING THRESHOLD PULSEWIDTH: 0.4 ms
MDC IDC PG IMPLANT DT: 20150316
MDC IDC SESS DTM: 20190329093149
MDC IDC SET LEADCHNL RV PACING PULSEWIDTH: 0.4 ms
MDC IDC STAT BRADY AP VP PERCENT: 74 %
MDC IDC STAT BRADY AS VS PERCENT: 0 %

## 2017-08-17 MED ORDER — NITROGLYCERIN 0.4 MG SL SUBL: 0.4000 mg | SUBLINGUAL_TABLET | SUBLINGUAL | 3 refills | Status: AC | PRN

## 2017-08-17 NOTE — Patient Instructions (Addendum)
Medication Instructions:  Your physician has recommended you make the following change in your medication:  1.  Nitro tabs-take 0.4 mg one tablet by mouth as needed every 5 minutes for chest pain.  No more than 3 total daily. Seek emergency care if you have to take 3 tablets with no relief.  Labwork: None ordered.  Testing/Procedures: None ordered.  Follow-Up: Your physician wants you to follow-up in: one year with Dr. Lovena Le.   You will receive a reminder letter in the mail two months in advance. If you don't receive a letter, please call our office to schedule the follow-up appointment.  Remote monitoring is used to monitor your Pacemaker from home. This monitoring reduces the number of office visits required to check your device to one time per year. It allows [place] to keep an eye on the functioning of your device to ensure it is working properly. You are scheduled for a device check from home on 09/10/2017. You may send your transmission at any time that day. If you have a wireless device, the transmission will be sent automatically. After your physician reviews your transmission, you will receive a postcard with your next transmission date.  Any Other Special Instructions Will Be Listed Below (If Applicable).  If you need a refill on your cardiac medications before your next appointment, please call your pharmacy.

## 2017-08-17 NOTE — Progress Notes (Signed)
HPI Eugene Long returns today for followup. He is a very pleasant 84 year old man with complete heart block, status post permanent pacemaker insertion. He also is a history of hypertension, diabetes, and dyslipidemia. Despite his advanced age, he continues to do well. He denies chest pain, shortness of breath, or peripheral edema. He has not been in the hospital in the past year. He is moving a little bit slower since I saw him last.  Allergies  Allergen Reactions  . Codeine Other (See Comments)    Runny eyes and nose,  Teary eyes  . Glyburide Other (See Comments)    Unknown  . Lisinopril Itching  . Magnesium Oxide     Rash, itching  . Uroxatral [Alfuzosin] Other (See Comments)    Heart races  . Epinephrine Palpitations  . Hctz [Hydrochlorothiazide] Rash     Current Outpatient Medications  Medication Sig Dispense Refill  . acetaminophen (TYLENOL) 500 MG tablet Take 500 mg by mouth as needed (low back pain).    Marland Kitchen aspirin 81 MG tablet Take 81 mg by mouth daily.    . candesartan (ATACAND) 8 MG tablet Take 1 tablet (8 mg total) by mouth daily. 90 tablet 3  . cholecalciferol (VITAMIN D) 1000 UNITS tablet Take 2,000 Units by mouth daily.     . CVS LANCETS THIN 26G MISC CHECK BLOOD SUGAR ONCE A DAY 100 each 3  . docusate sodium (COLACE) 100 MG capsule Take 100 mg by mouth daily.    . finasteride (PROSCAR) 5 MG tablet TAKE 1 TABLET ONCE DAILY TO TREAT ENLARGED PROSTATE 90 tablet 3  . loratadine (CLARITIN) 10 MG tablet Take one tablet by mouth at bedtime for allergies    . metoprolol tartrate (LOPRESSOR) 25 MG tablet TAKE ONE AND ONE-HALF TABLETS TWICE A DAY 270 tablet 3  . Multiple Vitamins-Minerals (ICAPS PO) Take 1 capsule by mouth daily.     Marland Kitchen omeprazole (PRILOSEC) 40 MG capsule Take 40 mg by mouth daily.     . ONE TOUCH ULTRA TEST test strip CHECK BLOOD SUGAR ONCE DAILY. 100 each 10  . ONE TOUCH ULTRA TEST test strip CHECK BLOOD SUGAR ONCE DAILY. 90 each 10  . pravastatin  (PRAVACHOL) 10 MG tablet One daily to lower cholesterol 90 tablet 4  . sertraline (ZOLOFT) 50 MG tablet TAKE ONE-HALF (1/2) TABLET EVERY FIVE DAYS TO HELP ANXIETY AND DEPRESSION 45 tablet 3  . nitroGLYCERIN (NITROSTAT) 0.4 MG SL tablet Place 1 tablet (0.4 mg total) under the tongue every 5 (five) minutes as needed for chest pain. 90 tablet 3   No current facility-administered medications for this visit.      Past Medical History:  Diagnosis Date  . Actinic keratosis   . Acute venous embolism and thrombosis of unspecified deep vessels of lower extremity   . Anxiety   . Atrioventricular block, complete ([place])   . Benign neoplasm of colon    Polyp  . BPH (benign prostatic hypertrophy)   . Chronic kidney disease, stage II (mild) 12/10/2012  . Complete rupture of rotator cuff   . Contracture of palmar fascia   . Coronary atherosclerosis of native coronary artery   . Depressive disorder, not elsewhere classified   . Diabetes mellitus ([place])   . Diaphragmatic hernia without mention of obstruction or gangrene   . Diverticulosis    colon, Hx of  . Diverticulosis of colon (without mention of hemorrhage)   . Dysphagia, unspecified(787.20)   . Esophageal reflux   .  Hypertension   . Hypertrophy of prostate with urinary obstruction and other lower urinary tract symptoms (LUTS)   . Insomnia, unspecified   . Lumbago   . 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 Osteoarthrosis, unspecified whether generalized or localized, unspecified site   . Other and unspecified hyperlipidemia   . Other specified disorder of male genital organs(608.89)    right epididymal cyst  . Peripheral vascular disease, unspecified ([place])   . Stricture and stenosis of esophagus   . Unspecified constipation   . Unspecified hearing loss   . Unspecified pruritic disorder     ROS:   All systems reviewed and negative except as noted in the HPI.   Past Surgical History:  Procedure  Laterality Date  . APPENDECTOMY  1942  . arthoscopy right knee  1980  . CAROTID ENDARTERECTOMY  1987   right  . CATARACT EXTRACTION  [id number]  . COLONOSCOPY    . COLONOSCOPY  2005   Dr. Sharlett Iles  . ENUCLEATION  02/26/2009   left eye choroidal melanoma  . KNEE ARTHROSCOPY Left 1993  . PACEMAKER GENERATOR CHANGE  08/04/13   Dr. Lovena Le  . PACEMAKER INSERTION  2005; 08/04/2013   Dual chamber MDT pacemaker implanted by Dr Lovena Le in 2005 for CHB; gen change by Dr Lovena Le 07/2013 - MDT ADDRL1   . PERMANENT PACEMAKER GENERATOR CHANGE N/A 08/04/2013   Procedure: PERMANENT PACEMAKER GENERATOR CHANGE;  Surgeon: Evans Lance, MD;  Location: [facility] C[facility] LAB;  Service: Cardiovascular;  Laterality: N/A;  . TONSILLECTOMY  1931     Family History  Problem Relation Age of Onset  . Liver cancer Father   . Pancreatic cancer Father   . Cancer Father   . Heart disease Sister        CHF  . Diabetes Paternal Uncle      Social History   Socioeconomic History  . Marital status: Married    Spouse name: Not on file  . Number of children: 3  . Years of education: Not on file  . Highest education level: Not on file  Occupational History  . Occupation: retired Pharmacist, community: RETIRED  Social Needs  . Financial resource strain: Not on file  . Food insecurity:    Worry: Not on file    Inability: Not on file  . Transportation needs:    Medical: Not on file    Non-medical: Not on file  Tobacco Use  . Smoking status: Former Smoker    Packs/day: 0.50    Years: 17.00    Pack years: 8.50    Types: Cigarettes    Last attempt to quit: 05/22/1960    Years since quitting: 57.2  . Smokeless tobacco: Never Used  Substance and Sexual Activity  . Alcohol use: No  . Drug use: No  . Sexual activity: Never  Lifestyle  . Physical activity:    Days per week: Not on file    Minutes per session: Not on file  . Stress: Not on file  Relationships  . Social connections:    Talks on phone: Not on  file    Gets together: Not on file    Attends religious service: Not on file    Active member of club or organization: Not on file    Attends meetings of clubs or organizations: Not on file    Relationship status: Not on file  . Intimate partner violence:  Fear of current or ex partner: Not on file    Emotionally abused: Not on file    Physically abused: Not on file    Forced sexual activity: Not on file  Other Topics Concern  . Not on file  Social History Narrative   Patient [facility] at [facility] since 2003.   Widowed   Living will, POA   In [place]   Pacemaker   Previously worked in Research officer, trade union   Exercise walks 15-20 minutes twice a day most days     BP (!) 142/50   Pulse 64   Ht 5\' 8"  (1.727 m)   Wt 143 lb (64.9 kg)   SpO2 96%   BMI 21.74 kg/m   Physical Exam:  Well appearing elderly man, NAD HEENT: Unremarkable Neck:  6 cm JVD, no thyromegally Lymphatics:  No adenopathy Back:  No CVA tenderness Lungs:  Clear with no wheezes HEART:  Regular rate rhythm, no murmurs, no rubs, no clicks Abd:  soft, positive bowel sounds, no organomegally, no rebound, no guarding Ext:  2 plus pulses, no edema, no cyanosis, no clubbing Skin:  No rashes no nodules Neuro:  CN II through XII intact, motor grossly intact   DEVICE  Normal device function.  See PaceArt for details.   Assess/Plan: 1. Complete heart block - he is doing well, s/p PPM insertion 2. HTN - his blood pressure is reasonably well controlled. Will follow. 3. PM - his medtronic DDD PM is working normally. Will recheck in several months.  Mikle Bosworth.D.

## 2017-09-01 ENCOUNTER — Emergency Department (HOSPITAL_COMMUNITY)
Admission: EM | Admit: 2017-09-01 | Discharge: 2017-09-01 | Disposition: A | Discharge status: Home / Self Care | Payer: Medicare Other | Attending: Emergency Medicine | Admitting: Emergency Medicine

## 2017-09-01 ENCOUNTER — Encounter (HOSPITAL_COMMUNITY): Payer: Self-pay | Admitting: Emergency Medicine

## 2017-09-01 ENCOUNTER — Emergency Department (HOSPITAL_COMMUNITY): Payer: Medicare Other

## 2017-09-01 DIAGNOSIS — Z79899 Other long term (current) drug therapy: Secondary | ICD-10-CM | POA: Diagnosis not present

## 2017-09-01 DIAGNOSIS — Z87891 Personal history of nicotine dependence: Secondary | ICD-10-CM | POA: Insufficient documentation

## 2017-09-01 DIAGNOSIS — N182 Chronic kidney disease, stage 2 (mild): Secondary | ICD-10-CM | POA: Diagnosis not present

## 2017-09-01 DIAGNOSIS — R079 Chest pain, unspecified: Secondary | ICD-10-CM | POA: Diagnosis present

## 2017-09-01 DIAGNOSIS — R072 Precordial pain: Secondary | ICD-10-CM | POA: Diagnosis not present

## 2017-09-01 DIAGNOSIS — Z7982 Long term (current) use of aspirin: Secondary | ICD-10-CM | POA: Insufficient documentation

## 2017-09-01 DIAGNOSIS — R21 Rash and other nonspecific skin eruption: Secondary | ICD-10-CM | POA: Diagnosis not present

## 2017-09-01 DIAGNOSIS — I129 Hypertensive chronic kidney disease with stage 1 through stage 4 chronic kidney disease, or unspecified chronic kidney disease: Secondary | ICD-10-CM | POA: Diagnosis not present

## 2017-09-01 LAB — BASIC METABOLIC PANEL
ANION GAP: 9 (ref 5–15)
BUN: 32 mg/dL — ABNORMAL HIGH (ref 6–20)
CALCIUM: 10.5 mg/dL — AB (ref 8.9–10.3)
CO2: 20 mmol/L — AB (ref 22–32)
Chloride: 104 mmol/L (ref 101–111)
Creatinine, Ser: 1.59 mg/dL — ABNORMAL HIGH (ref 0.61–1.24)
GFR calc Af Amer: 40 mL/min — ABNORMAL LOW (ref >60)
GFR, EST NON AFRICAN AMERICAN: 35 mL/min — AB (ref >60)
GLUCOSE: 122 mg/dL — AB (ref 65–99)
Potassium: 4.4 mmol/L (ref 3.5–5.1)
Sodium: 133 mmol/L — ABNORMAL LOW (ref 135–145)

## 2017-09-01 LAB — I-STAT TROPONIN, ED
TROPONIN I, POC: 0.04 ng/mL (ref 0.00–0.08)
Troponin i, poc: 0.02 ng/mL (ref 0.00–0.08)

## 2017-09-01 LAB — CBC
HCT: 34.4 % — ABNORMAL LOW (ref 39.0–52.0)
Hemoglobin: 11.5 g/dL — ABNORMAL LOW (ref 13.0–17.0)
MCH: 30.8 pg (ref 26.0–34.0)
MCHC: 33.4 g/dL (ref 30.0–36.0)
MCV: 92.2 fL (ref 78.0–100.0)
Platelets: 168 10*3/uL (ref 150–400)
RBC: 3.73 MIL/uL — ABNORMAL LOW (ref 4.22–5.81)
RDW: 13.4 % (ref 11.5–15.5)
WBC: 6.6 10*3/uL (ref 4.0–10.5)

## 2017-09-01 NOTE — ED Triage Notes (Addendum)
Per EMS- pt here from [facility] for [employer]. Sudden onset sweats,  and chest pain across the chest. Pt has noted bruising to his neck, he states this was from a syncopal episode a week ago. No chest pain currently. Has a pacemaker. Upon assessment the patient has a rash to his chest and arms. He states it is itchy. Pt has decreased mobility and needed assistance moving from EMS stretcher but states he does not have a walker or cane for assistance when walking.

## 2017-09-01 NOTE — ED Provider Notes (Signed)
[facility]ERG[facility] DEPARTMENT Provider Note   CSN: [id number] Arrival date & time: 09/01/17  8242     History   Chief Complaint Chief Complaint  Patient presents with  . Chest Pain  . Rash    HPI Eugene Long is a 84 y.o. male.  [facility] on sudden onset of chest pain that occurred this morning early.  Associated with some sweats.  Patient has old bruising to his neck from a fall following a syncopal episode last week.  Patient has a history of a pacemaker has not had open heart surgery and does not have stents.  Patient's had a rash on the anterior part of his chest up around the shoulder area and to the upper back for some period of time now they are not sure what it is from.  It is bilateral.  Patient states it does itch some.  Patient does require some assistance with walking.  Patient is not on  Any major blood thinners.  Just an aspirin a day.     Past Medical History:  Diagnosis Date  . Actinic keratosis   . Acute venous embolism and thrombosis of unspecified deep vessels of lower extremity   . Anxiety   . Atrioventricular block, complete ([place])   . Benign neoplasm of colon    Polyp  . BPH (benign prostatic hypertrophy)   . Chronic kidney disease, stage II (mild) 12/10/2012  . Complete rupture of rotator cuff   . Contracture of palmar fascia   . Coronary atherosclerosis of native coronary artery   . Depressive disorder, not elsewhere classified   . Diabetes mellitus ([place])   . Diaphragmatic hernia without mention of obstruction or gangrene   . Diverticulosis    colon, Hx of  . Diverticulosis of colon (without mention of hemorrhage)   . Dysphagia, unspecified(787.20)   . Esophageal reflux   . Hypertension   . Hypertrophy of prostate with urinary obstruction and other lower urinary tract symptoms (LUTS)   . Insomnia, unspecified   . Lumbago   . Melanoma of skin, site unspecified Sept 2010   choroidal  of left eye  . Osteoarthrosis, unspecified whether generalized or localized, unspecified site   . Other and unspecified hyperlipidemia   . Other specified disorder of male genital organs(608.89)    right epididymal cyst  . Peripheral vascular disease, unspecified ([place])   . Stricture and stenosis of esophagus   . Unspecified constipation   . Unspecified hearing loss   . Unspecified pruritic disorder     Patient Active Problem List   Diagnosis Date Noted  . CKD (chronic kidney disease) stage 3, GFR 30-59 ml/min (HCC) 02/14/2017  . Non-seasonal allergic rhinitis 02/14/2017  . Benign prostatic hyperplasia with weak urinary stream 02/14/2017  . Constipation 04/11/2016  . Leg cramp 04/11/2016  . Pruritus 06/08/2015  . Arthralgia 06/08/2015  . Macular degeneration 08/04/2014  . DM (diabetes mellitus), type 2 with peripheral vascular complications ([place]) 35/36/1443  . Type 2 DM with CKD stage 3 and hypertension ([place]) 11/26/2013  . DM type 2 with diabetic peripheral neuropathy ([place]) 11/26/2013  . Coronary atherosclerosis of native coronary artery   . Heart block   . Lumbago 06/21/2013  . Dupuytren's contracture 06/21/2013  . Presence of permanent cardiac pacemaker   . Melanoma of skin ([place])   . Other specified disorder of male genital organs(608.89)   . Chronic kidney disease, stage II (mild) 12/10/2012  .  Fitting and adjustment of cardiac pacemaker 12/10/2012  . Contracture of palmar fascia 06/13/2012  . Palpitations 03/12/2012  . POLYP, COLON 02/25/2007  . Hyperlipidemia 02/25/2007  . Depression 02/25/2007  . Essential hypertension 02/25/2007  . Non-ischemic cardiomyopathy ([place]) 02/25/2007  . Cardiac conduction disorder 02/25/2007  . PERIPHERAL VASCULAR DISEASE 02/25/2007  . DVT 02/25/2007  . Hiatal hernia with GERD 02/25/2007  . DEGENERATIVE JOINT DISEASE 02/25/2007  . ROTATOR CUFF TEAR 02/25/2007  . DIVERTICULOSIS, COLON, HX OF 02/25/2007  . BENIGN PROSTATIC HYPERTROPHY, HX  OF 02/25/2007  . CAROTID ENDARTERECTOMY, RIGHT, HX OF 02/25/2007    Past Surgical History:  Procedure Laterality Date  . APPENDECTOMY  1942  . arthoscopy right knee  1980  . CAROTID ENDARTERECTOMY  1987   right  . CATARACT EXTRACTION  [id number]  . COLONOSCOPY    . COLONOSCOPY  2005   Dr. Sharlett Iles  . ENUCLEATION  02/26/2009   left eye choroidal melanoma  . KNEE ARTHROSCOPY Left 1993  . PACEMAKER GENERATOR CHANGE  08/04/13   Dr. Lovena Le  . PACEMAKER INSERTION  2005; 08/04/2013   Dual chamber MDT pacemaker implanted by Dr Lovena Le in 2005 for CHB; gen change by Dr Lovena Le 07/2013 - MDT ADDRL1   . PERMANENT PACEMAKER GENERATOR CHANGE N/A 08/04/2013   Procedure: PERMANENT PACEMAKER GENERATOR CHANGE;  Surgeon: Evans Lance, MD;  Location: [facility] CATH LAB;  Service: Cardiovascular;  Laterality: N/A;  . TONSILLECTOMY  1931        Home Medications    Prior to Admission medications   Medication Sig Start Date End Date Taking? Authorizing Provider  acetaminophen (TYLENOL) 500 MG tablet Take 500 mg by mouth as needed (low back pain).    [provider]  aspirin 81 MG tablet Take 81 mg by mouth daily.    [provider]  candesartan (ATACAND) 8 MG tablet Take 1 tablet (8 mg total) by mouth daily. 08/13/17   Blanchie Serve, MD  cholecalciferol (VITAMIN D) 1000 UNITS tablet Take 2,000 Units by mouth daily.     [provider]  [employer] LANCETS THIN 26G MISC CHECK BLOOD SUGAR ONCE A DAY 05/07/17   Blanchie Serve, MD  docusate sodium (COLACE) 100 MG capsule Take 100 mg by mouth daily.    [provider]  finasteride (PROSCAR) 5 MG tablet TAKE 1 TABLET ONCE DAILY TO TREAT ENLARGED PROSTATE 06/18/17   Blanchie Serve, MD  loratadine (CLARITIN) 10 MG tablet Take one tablet by mouth at bedtime for allergies    [provider]  metoprolol tartrate (LOPRESSOR) 25 MG tablet TAKE ONE AND ONE-HALF TABLETS TWICE A DAY 03/26/17   Blanchie Serve, MD  Multiple Vitamins-Minerals  (ICAPS PO) Take 1 capsule by mouth daily.     [provider]  nitroGLYCERIN (NITROSTAT) 0.4 MG SL tablet Place 1 tablet (0.4 mg total) under the tongue every 5 (five) minutes as needed for chest pain. 08/17/17 11/15/17  Evans Lance, MD  omeprazole (PRILOSEC) 40 MG capsule Take 40 mg by mouth daily.     [provider]  ONE TOUCH ULTRA TEST test strip CHECK BLOOD SUGAR ONCE DAILY. 08/09/16   Estill Dooms, MD  ONE TOUCH ULTRA TEST test strip CHECK BLOOD SUGAR ONCE DAILY. 03/05/17   Reed, Tiffany L, DO  pravastatin (PRAVACHOL) 10 MG tablet One daily to lower cholesterol 08/08/16   Estill Dooms, MD  sertraline (ZOLOFT) 50 MG tablet TAKE ONE-HALF (1/2) TABLET EVERY FIVE DAYS TO HELP ANXIETY AND DEPRESSION 05/20/17  Blanchie Serve, MD    Family History Family History  Problem Relation Age of Onset  . Liver cancer Father   . Pancreatic cancer Father   . Cancer Father   . Heart disease Sister        CHF  . Diabetes Paternal Uncle     Social History Social History   Tobacco Use  . Smoking status: Former Smoker    Packs/day: 0.50    Years: 17.00    Pack years: 8.50    Types: Cigarettes    Last attempt to quit: 05/22/1960    Years since quitting: 57.3  . Smokeless tobacco: Never Used  Substance Use Topics  . Alcohol use: No  . Drug use: No     Allergies   Codeine; Glyburide; Lisinopril; Magnesium oxide; Uroxatral [alfuzosin]; Epinephrine; and Hctz [hydrochlorothiazide]   Review of Systems Review of Systems  Constitutional: Negative for fever.  HENT: Negative for congestion.   Eyes: Negative for redness.  Respiratory: Negative for shortness of breath.   Cardiovascular: Positive for chest pain.  Gastrointestinal: Negative for abdominal pain.  Genitourinary: Negative for dysuria.  Musculoskeletal: Negative for back pain.  Skin: Positive for rash.  Neurological: Positive for syncope. Negative for headaches.  Hematological: Does not bruise/bleed easily.    Psychiatric/Behavioral: Negative for confusion.     Physical Exam Updated Vital Signs BP (!) 186/71   Pulse 87   Temp 98.2 F (36.8 C) (Oral)   Resp 18   Ht 1.727 m (5\' 8" )   Wt 64.9 kg (143 lb)   SpO2 98%   BMI 21.74 kg/m   Physical Exam  Constitutional: He is oriented to person, place, and time. He appears well-developed and well-nourished. No distress.  HENT:  Head: Normocephalic.  Mouth/Throat: Oropharynx is clear and moist.  Anterior base of the neck with an area of bruising measuring about 7 cm.  Appears several days old.  No significant hematoma.  Patient swallows fine.  Eyes: Pupils are equal, round, and reactive to light. Conjunctivae and EOM are normal.  Neck: Neck supple.  Cardiovascular: Normal rate, regular rhythm and normal heart sounds.  Pulmonary/Chest: Effort normal and breath sounds normal. No respiratory distress.  Abdominal: Soft. Bowel sounds are normal. There is no tenderness.  Musculoskeletal: Normal range of motion. He exhibits edema.   trace bilateral edema.  Neurological: He is alert and oriented to person, place, and time. No cranial nerve deficit or sensory deficit. He exhibits normal muscle tone. Coordination normal.  Skin: Rash noted.  Papular erythematous rash without vesicles to bilateral anterior chest top shoulders and upper back.  Nursing note and vitals reviewed.    ED Treatments / Results  Labs (all labs ordered are listed, but only abnormal results are displayed) Labs Reviewed  BASIC METABOLIC PANEL - Abnormal; Notable for the following components:      Result Value   Sodium 133 (*)    CO2 20 (*)    Glucose, Bld 122 (*)    BUN 32 (*)    Creatinine, Ser 1.59 (*)    Calcium 10.5 (*)    GFR calc non Af Amer 35 (*)    GFR calc Af Amer 40 (*)    All other components within normal limits  CBC - Abnormal; Notable for the following components:   RBC 3.73 (*)    Hemoglobin 11.5 (*)    HCT 34.4 (*)    All other components within  normal limits  I-STAT TROPONIN, ED  I-STAT TROPONIN,  ED    EKG EKG Interpretation  Date/Time:  Saturday September 01 2017 09:31:20 EDT Ventricular Rate:  87 PR Interval:    QRS Duration: 154 QT Interval:  387 QTC Calculation: 466 R Axis:   -76 Text Interpretation:  Atrial-sensed ventricular-paced rhythm No further analysis attempted due to paced rhythm Confirmed by Fredia Sorrow [phone]) on 09/01/2017 9:39:22 AM Also confirmed by Fredia Sorrow [phone]), editor Philomena Doheny [phone])  on 09/01/2017 9:44:35 AM   Radiology Dg Chest 2 View  Result Date: 09/01/2017 CLINICAL DATA:  Sudden onset chest pain. EXAM: CHEST - 2 VIEW COMPARISON:  Chest x-ray dated July 08, 2009. FINDINGS: Unchanged left chest wall AICD. The heart size and mediastinal contours are within normal limits. Normal pulmonary vascularity. Atherosclerotic calcification of the aortic arch. No focal consolidation, pleural effusion, or pneumothorax. No acute osseous abnormality. IMPRESSION: No active cardiopulmonary disease. Electronically Signed   By: Titus Dubin M.D.   On: 09/01/2017 10:03    Procedures Procedures (including critical care time)  Medications Ordered in ED Medications - No data to display   Initial Impression / Assessment and Plan / ED Course  I have reviewed the triage vital signs and the nursing notes.  Pertinent labs & imaging results that were available during my care of the patient were reviewed by me and considered in my medical decision making (see chart for details).       Workup for the chest pain without any acute findings.  Troponins x2-.  EKG without significant findings chest x-ray negative.  Labs although he does have renal insufficiency no significant worse than baseline.  Patient without any further chest pain.  Patient does have papular erythematous rash to anterior chest top of the shoulders and back of the upper part of the back.  Not sure the etiology does not seem to be  consistent with herpes zoster.  Since his bilateral.  No true vesicles.  Patient nontoxic no acute distress.  Patient stable for discharge back to nursing facility.  Etiology of the rash is not clear.  Final Clinical Impressions(s) / ED Diagnoses   Final diagnoses:  Precordial pain  Rash    ED Discharge Orders    None       Fredia Sorrow, MD 09/01/17 1431

## 2017-09-01 NOTE — Discharge Instructions (Addendum)
Today's workup for the chest pain without any acute findings.  Return for any new or worse chest pain.  Follow-up with cardiology.  Regarding the rash on the chest really not certain the cause of that.  Recommend follow-up with nursing facility provider.

## 2017-09-03 ENCOUNTER — Encounter: Payer: Self-pay | Admitting: Internal Medicine

## 2017-09-03 ENCOUNTER — Non-Acute Institutional Stay: Payer: Medicare Other | Admitting: Internal Medicine

## 2017-09-03 VITALS — BP 134/60 | HR 60 | Temp 97.5°F | Resp 16 | Ht 68.0 in | Wt 141.0 lb

## 2017-09-03 DIAGNOSIS — I25118 Atherosclerotic heart disease of native coronary artery with other forms of angina pectoris: Secondary | ICD-10-CM

## 2017-09-03 DIAGNOSIS — R079 Chest pain, unspecified: Secondary | ICD-10-CM | POA: Diagnosis not present

## 2017-09-03 DIAGNOSIS — R55 Syncope and collapse: Secondary | ICD-10-CM

## 2017-09-03 DIAGNOSIS — I350 Nonrheumatic aortic (valve) stenosis: Secondary | ICD-10-CM | POA: Insufficient documentation

## 2017-09-03 DIAGNOSIS — I459 Conduction disorder, unspecified: Secondary | ICD-10-CM

## 2017-09-03 DIAGNOSIS — R21 Rash and other nonspecific skin eruption: Secondary | ICD-10-CM

## 2017-09-03 NOTE — Progress Notes (Signed)
[facility]  Provider: Blanchie Serve MD   Location:  [place]   Place of Service:  Clinic (12)  PCP: Blanchie Serve, MD Eugene Long Care Team: Blanchie Serve, MD as PCP - General (Internal Medicine) Shon Hough, MD as Consulting Physician (Ophthalmology) Irine Seal, MD as Consulting Physician (Urology) Sable Feil, MD as Consulting Physician (Gastroenterology) Gerarda Fraction, MD as Consulting Physician (Ophthalmology) [place] Evans Lance, MD (Cardiology) Sable Feil, MD as Consulting Physician (Gastroenterology) Rosendo Gros, Marnee Spring (Inactive) as Consulting Physician (Ophthalmology) Ngetich, Nelda Bucks, NP as Nurse Practitioner (Family Medicine)  Extended Emergency Contact Information Primary Emergency Contact: Carmell [place], [place] Phone: [phone] Relation: Daughter   Goals of Care: Advanced Directive information Advanced Directives 03/14/2017  Does Eugene Long Have a Medical Advance Directive? Yes  Type of Advance Directive [place]  Does Eugene Long want to make changes to medical advance directive? No - Eugene Long declined  Copy of [place] in Chart? Yes     Chief Complaint  Eugene Long presents with  . Follow-up    ED follow up on chest pain and rash    HPI: Eugene Long is a 84 y.o. male seen today for acute visit for follow up for ED visit. He was in the ED on 09/01/17 with chest pain and rash. 2 sets of troponin, EKG and chest xray were unrevealing for cardiac or pulmonary etiology. They were unclear about the rash. Eugene Long was discharged home with follow up with PCP. He has history of CAD and complete heart block. He is s/p permanent pacemaker and follows with Dr Lovena Le. Of note, Eugene Long mentions having a fall a week back from passing out. He is unable to say how long he had passed out for. He has resolving bruise to his face and neck  area. He is chest pain free this visit. He mentions having 2-3 episodes of chest pain over last 2-3 weeks. He points to left chest with pain going across the chest, denies radiation of pain to arm or neck. Denies palpitation or dizziness with pain. Pain starts with exertion and is relieved with rest and nitroglycerin. He denies any shortness of breath. Reviewed OV note from cardiology office on 08/17/17 and ED note from 09/01/17. He complaints of intermittent itching to his chest, back and arm for several weeks. This interrupts his sleep. Denies any new medication, skin product or clothing.   Past Medical History:  Diagnosis Date  . Actinic keratosis   . Acute venous embolism and thrombosis of unspecified deep vessels of lower extremity   . Anxiety   . Atrioventricular block, complete ([place])   . Benign neoplasm of colon    Polyp  . BPH (benign prostatic hypertrophy)   . Chronic kidney disease, stage II (mild) 12/10/2012  . Complete rupture of rotator cuff   . Contracture of palmar fascia   . Coronary atherosclerosis of native coronary artery   . Depressive disorder, not elsewhere classified   . Diabetes mellitus ([place])   . Diaphragmatic hernia without mention of obstruction or gangrene   . Diverticulosis    colon, Hx of  . Diverticulosis of colon (without mention of hemorrhage)   . Dysphagia, unspecified(787.20)   . Esophageal reflux   . Hypertension   . Hypertrophy of prostate with urinary obstruction and other lower urinary tract symptoms (LUTS)   . Insomnia, unspecified   .  Lumbago   . 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 Osteoarthrosis, unspecified whether generalized or localized, unspecified site   . Other and unspecified hyperlipidemia   . Other specified disorder of male genital organs(608.89)    right epididymal cyst  . Peripheral vascular disease, unspecified ([place])   . Stricture and stenosis of esophagus   . Unspecified constipation   .  Unspecified hearing loss   . Unspecified pruritic disorder    Past Surgical History:  Procedure Laterality Date  . APPENDECTOMY  1942  . arthoscopy right knee  1980  . CAROTID ENDARTERECTOMY  1987   right  . CATARACT EXTRACTION  [id number]  . COLONOSCOPY    . COLONOSCOPY  2005   Dr. Sharlett Iles  . ENUCLEATION  02/26/2009   left eye choroidal melanoma  . KNEE ARTHROSCOPY Left 1993  . PACEMAKER GENERATOR CHANGE  08/04/13   Dr. Lovena Le  . PACEMAKER INSERTION  2005; 08/04/2013   Dual chamber MDT pacemaker implanted by Dr Lovena Le in 2005 for CHB; gen change by Dr Lovena Le 07/2013 - MDT ADDRL1   . PERMANENT PACEMAKER GENERATOR CHANGE N/A 08/04/2013   Procedure: PERMANENT PACEMAKER GENERATOR CHANGE;  Surgeon: Evans Lance, MD;  Location: [facility] CATH LAB;  Service: Cardiovascular;  Laterality: N/A;  . TONSILLECTOMY  1931    reports that he quit smoking about 57 years ago. His smoking use included cigarettes. He has a 8.50 pack-year smoking history. He has never used smokeless tobacco. He reports that he does not drink alcohol or use drugs. Social History   Socioeconomic History  . Marital status: Married    Spouse name: Not on file  . Number of children: 3  . Years of education: Not on file  . Highest education level: Not on file  Occupational History  . Occupation: retired Pharmacist, community: RETIRED  Social Needs  . Financial resource strain: Not on file  . Food insecurity:    Worry: Not on file    Inability: Not on file  . Transportation needs:    Medical: Not on file    Non-medical: Not on file  Tobacco Use  . Smoking status: Former Smoker    Packs/day: 0.50    Years: 17.00    Pack years: 8.50    Types: Cigarettes    Last attempt to quit: 05/22/1960    Years since quitting: 57.3  . Smokeless tobacco: Never Used  Substance and Sexual Activity  . Alcohol use: No  . Drug use: No  . Sexual activity: Never  Lifestyle  . Physical activity:    Days per week: Not on file     Minutes per session: Not on file  . Stress: Not on file  Relationships  . Social connections:    Talks on phone: Not on file    Gets together: Not on file    Attends religious service: Not on file    Active member of club or organization: Not on file    Attends meetings of clubs or organizations: Not on file    Relationship status: Not on file  . Intimate partner violence:    Fear of current or ex partner: Not on file    Emotionally abused: Not on file    Physically abused: Not on file    Forced sexual activity: Not on file  Other Topics Concern  . Not on file  Social History Narrative   Eugene Long lives at [facility]  Home [place] since 2003.   Widowed   Living will, POA   In [place]   Pacemaker   Previously worked in Research officer, trade union   Exercise walks 15-20 minutes twice a day most days     Family History  Problem Relation Age of Onset  . Liver cancer Father   . Pancreatic cancer Father   . Cancer Father   . Heart disease Sister        CHF  . Diabetes Paternal Uncle     Health Maintenance  Topic Date Due  . PNA vac Low Risk Adult (2 of 2 - PCV13) 05/22/2006  . TETANUS/TDAP  05/23/2007  . INFLUENZA VACCINE  12/20/2017  . OPHTHALMOLOGY EXAM  12/21/2017  . HEMOGLOBIN A1C  01/13/2018  . FOOT EXAM  02/14/2018    Allergies  Allergen Reactions  . Codeine Other (See Comments)    Runny eyes and nose,  Teary eyes  . Glyburide Other (See Comments)    Unknown  . Lisinopril Itching  . Magnesium Oxide     Rash, itching  . Uroxatral [Alfuzosin] Other (See Comments)    Heart races  . Epinephrine Palpitations  . Hctz [Hydrochlorothiazide] Rash    Outpatient Encounter Medications as of 09/03/2017  Medication Sig  . acetaminophen (TYLENOL) 500 MG tablet Take 500 mg by mouth as needed (low back pain).  Marland Kitchen aspirin 81 MG tablet Take 81 mg by mouth daily.  . candesartan (ATACAND) 8 MG tablet Take 1 tablet (8 mg total) by mouth daily.  . cholecalciferol (VITAMIN D) 1000  UNITS tablet Take 2,000 Units by mouth daily.   . [employer] LANCETS THIN 26G MISC CHECK BLOOD SUGAR ONCE A DAY  . docusate sodium (COLACE) 100 MG capsule Take 100 mg by mouth daily.  . finasteride (PROSCAR) 5 MG tablet TAKE 1 TABLET ONCE DAILY TO TREAT ENLARGED PROSTATE  . loratadine (CLARITIN) 10 MG tablet Take one tablet by mouth at bedtime for allergies  . metoprolol tartrate (LOPRESSOR) 25 MG tablet TAKE ONE AND ONE-HALF TABLETS TWICE A DAY  . Multiple Vitamins-Minerals (ICAPS PO) Take 1 capsule by mouth daily.   . nitroGLYCERIN (NITROSTAT) 0.4 MG SL tablet Place 1 tablet (0.4 mg total) under the tongue every 5 (five) minutes as needed for chest pain.  Marland Kitchen omeprazole (PRILOSEC) 40 MG capsule Take 40 mg by mouth daily.   . ONE TOUCH ULTRA TEST test strip CHECK BLOOD SUGAR ONCE DAILY.  . ONE TOUCH ULTRA TEST test strip CHECK BLOOD SUGAR ONCE DAILY.  . pravastatin (PRAVACHOL) 10 MG tablet One daily to lower cholesterol  . sertraline (ZOLOFT) 50 MG tablet TAKE ONE-HALF (1/2) TABLET EVERY FIVE DAYS TO HELP ANXIETY AND DEPRESSION   No facility-administered encounter medications on file as of 09/03/2017.     Review of Systems  Constitutional: Negative for appetite change, chills and fever.  HENT: Positive for trouble swallowing. Negative for congestion and rhinorrhea.   Respiratory: Positive for shortness of breath. Negative for cough.        Dyspnea with exertion  Cardiovascular: Negative for chest pain, palpitations and leg swelling.       Chest pain free this visit but has been having intermittent chest pain for 2-3 weeks  Gastrointestinal: Negative for abdominal pain, nausea and vomiting.  Genitourinary: Negative for dysuria.  Musculoskeletal: Positive for back pain and gait problem.  Neurological: Negative for dizziness and headaches.  Hematological: Bruises/bleeds easily.  Psychiatric/Behavioral: Negative for behavioral problems.    Vitals:  09/03/17 1556  BP: 134/60  Pulse: 60  Resp:  16  Temp: (!) 97.5 F (36.4 C)  TempSrc: Oral  SpO2: 99%  Weight: 141 lb (64 kg)  Height: 5\' 8"  (1.727 m)   Body mass index is 21.44 kg/m.   Wt Readings from Last 3 Encounters:  09/03/17 141 lb (64 kg)  09/01/17 143 lb (64.9 kg)  08/17/17 143 lb (64.9 kg)   Physical Exam  Constitutional: He is oriented to person, place, and time. He appears well-developed and well-nourished. No distress.  HENT:  Head: Normocephalic and atraumatic.  Mouth/Throat: Oropharynx is clear and moist.  Eyes: Pupils are equal, round, and reactive to light. Conjunctivae are normal.  Neck: Normal range of motion. Neck supple.  Cardiovascular: Normal rate and regular rhythm.  Pulmonary/Chest: Effort normal and breath sounds normal. No respiratory distress. He has no wheezes. He has no rales. He exhibits no tenderness.  Abdominal: Soft. Bowel sounds are normal. There is no tenderness. There is no guarding.  Musculoskeletal: He exhibits deformity. He exhibits no edema.  Unsteady gait  Lymphadenopathy:    He has no cervical adenopathy.  Neurological: He is alert and oriented to person, place, and time.  Skin: Skin is warm and dry. No rash noted. He is not diaphoretic.  Erythematous macules and papules to anterior chest, neck and arm, non tender, normal temperature, no drainage or vesicles. Bruise to anterior neck area and resolving bruise to face.   Psychiatric: He has a normal mood and affect.    Labs reviewed: Basic Metabolic Panel: Recent Labs    02/05/17 0800 07/16/17 0000 09/01/17 0941  NA 138 137 133*  K 4.5 4.7 4.4  CL 103 101 104  CO2 28 26 20*  GLUCOSE 87 104* 122*  BUN 31* 27* 32*  CREATININE 1.62* 1.62* 1.59*  CALCIUM 9.5 9.7 10.5*   Liver Function Tests: Recent Labs    02/05/17 0800 07/16/17 0000  AST 16 41*  ALT 10 40  BILITOT 0.7 0.8  PROT 6.3 6.2   No results for input(s): LIPASE, AMYLASE in the last 8760 hours. No results for input(s): AMMONIA in the last 8760  hours. CBC: Recent Labs    09/01/17 0941  WBC 6.6  HGB 11.5*  HCT 34.4*  MCV 92.2  PLT 168   Cardiac Enzymes: No results for input(s): CKTOTAL, CKMB, CKMBINDEX, TROPONINI in the last 8760 hours. BNP: Invalid input(s): POCBNP Lab Results  Component Value Date   HGBA1C 6.1 (H) 07/16/2017   No results found for: TSH No results found for: VITAMINB12 No results found for: FOLATE No results found for: IRON, TIBC, FERRITIN  Lipid Panel: Recent Labs    02/05/17 0800  CHOL 135  HDL 49  LDLCALC 69  TRIG 89  CHOLHDL 2.8   Lab Results  Component Value Date   HGBA1C 6.1 (H) 07/16/2017    Procedures since last visit: Dg Chest 2 View  Result Date: 09/01/2017 CLINICAL DATA:  Sudden onset chest pain. EXAM: CHEST - 2 VIEW COMPARISON:  Chest x-ray dated July 08, 2009. FINDINGS: Unchanged left chest wall AICD. The heart size and mediastinal contours are within normal limits. Normal pulmonary vascularity. Atherosclerotic calcification of the aortic arch. No focal consolidation, pleural effusion, or pneumothorax. No acute osseous abnormality. IMPRESSION: No active cardiopulmonary disease. Electronically Signed   By: Titus Dubin M.D.   On: 09/01/2017 10:03    Assessment/Plan  1. Atherosclerosis of native coronary artery of native heart with stable angina pectoris (Eugene Long)  Chest pain free today but he is having intermittent chest pain with exertion for last 2-3 weeks. No acute changes noted in EKG in ED. Continue statin, aspirin, candesartan with metoprolol tartrate and prn NTG for now. Cardiology follow up 4/16, if arrhythmia is ruled out, might benefit from imdur.   2. Heart block S/p pacemaker. With recent episodes of chest pain and syncope will need his pacemaker interrogated. Have called his cardiologist office, appointment made for tomorrow 4/16 at 10:15 am for evaluation.  3. Rash, skin Has dry skin with some macules and papules. No vesicles or pustules noted. Advised to  keep skin moisturized and to use cortisone cream bid to subside itching and irritation  4. Syncope, unspecified syncope type with resolving bruise to face and neck. With his cardiac history, will need to rule out cardiac etiology. With history of CHB, will need for assess for arrhythmia. Vital signs stable this visit. Denies dizziness. Neurologically intact. Pending cardiology f/u tomorrow. Will need to rule out for cardiogenic syncope   5. Chest pain, unspecified type Chest xray negative. ekg unrevealing. 2 sets of troponin engative in ED. Currently chest pain free. No chest wall tenderness. Afebrile. Vital signs stable. Chest pain with exertion and relieved with rest and NTG. Has history of CAD and CHB. Will need to rule out arrhythmia and will need his pacemaker interrogated. Another concern is for stable angina with history of CAD.      Labs/tests ordered:  Cardiology appt  Next appointment: 3-4 weeks, earlier if needed  Communication: reviewed care plan with Eugene Long   Blanchie Serve, MD Internal Medicine [place], [place] [address] Cell Phone (Monday-Friday 8 am - 5 pm): [phone] On Call: [phone] and follow prompts after 5 pm and on weekends Office Phone: [phone] Office Fax: [phone]
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
## 2017-09-03 NOTE — Patient Instructions (Signed)
  You will be seen by your heart doctor's office tomorrow for evaluation on your chest pain that comes with exertion.   For your skin rash, I need you to keep your skin moisturized. Apply the cortisone cream to help subside the itching twice a day for now.

## 2017-09-04 ENCOUNTER — Encounter: Payer: Self-pay | Admitting: Cardiology

## 2017-09-04 ENCOUNTER — Ambulatory Visit (INDEPENDENT_AMBULATORY_CARE_PROVIDER_SITE_OTHER): Payer: Medicare Other | Admitting: Cardiology

## 2017-09-04 VITALS — BP 102/60 | HR 57 | Ht 68.0 in | Wt 140.0 lb

## 2017-09-04 DIAGNOSIS — I10 Essential (primary) hypertension: Secondary | ICD-10-CM

## 2017-09-04 DIAGNOSIS — I350 Nonrheumatic aortic (valve) stenosis: Secondary | ICD-10-CM

## 2017-09-04 DIAGNOSIS — Z950 Presence of cardiac pacemaker: Secondary | ICD-10-CM | POA: Diagnosis not present

## 2017-09-04 DIAGNOSIS — R55 Syncope and collapse: Secondary | ICD-10-CM

## 2017-09-04 DIAGNOSIS — E782 Mixed hyperlipidemia: Secondary | ICD-10-CM | POA: Diagnosis not present

## 2017-09-04 DIAGNOSIS — R079 Chest pain, unspecified: Secondary | ICD-10-CM | POA: Diagnosis not present

## 2017-09-04 MED ORDER — ISOSORBIDE MONONITRATE ER 30 MG PO TB24: 15.0000 mg | ORAL_TABLET | Freq: Every day | ORAL | 3 refills | Status: AC

## 2017-09-04 NOTE — Patient Instructions (Signed)
Medication Instructions: STOP: Candesartan START: Imdur 15 mg daily (you will have to cut a 30 mg tablet in half)  Labwork: None Ordered  Procedures/Testing: Your physician has requested that you have an echocardiogram. Echocardiography is a painless test that uses sound waves to create images of your heart. It provides your doctor with information about the size and shape of your heart and how well your heart's chambers and valves are working. This procedure takes approximately one hour. There are no restrictions for this procedure.    Follow-Up: Your physician recommends that you schedule a follow-up appointment in: 4-6 weeks with Daune Perch PA-C   Any Additional Special Instructions Will Be Listed Below (If Applicable). Echocardiogram An echocardiogram, or echocardiography, uses sound waves (ultrasound) to produce an image of your heart. The echocardiogram is simple, painless, obtained within a short period of time, and offers valuable information to your health care provider. The images from an echocardiogram can provide information such as:  Evidence of coronary artery disease (CAD).  Heart size.  Heart muscle function.  Heart valve function.  Aneurysm detection.  Evidence of a past heart attack.  Fluid buildup around the heart.  Heart muscle thickening.  Assess heart valve function.  Tell a health care provider about:  Any allergies you have.  All medicines you are taking, including vitamins, herbs, eye drops, creams, and over-the-counter medicines.  Any problems you or family members have had with anesthetic medicines.  Any blood disorders you have.  Any surgeries you have had.  Any medical conditions you have.  Whether you are pregnant or may be pregnant. What happens before the procedure? No special preparation is needed. Eat and drink normally. What happens during the procedure?  In order to produce an image of your heart, gel will be applied to  your chest and a wand-like tool (transducer) will be moved over your chest. The gel will help transmit the sound waves from the transducer. The sound waves will harmlessly bounce off your heart to allow the heart images to be captured in real-time motion. These images will then be recorded.  You may need an IV to receive a medicine that improves the quality of the pictures. What happens after the procedure? You may return to your normal schedule including diet, activities, and medicines, unless your health care provider tells you otherwise. This information is not intended to replace advice given to you by your health care provider. Make sure you discuss any questions you have with your health care provider. Document Released: 05/05/2000 Document Revised: 12/25/2015 Document Reviewed: 01/13/2013 Elsevier Interactive Patient Education  2017 [employer].      If you need a refill on your cardiac medications before your next appointment, please call your pharmacy.

## 2017-09-07 ENCOUNTER — Emergency Department (HOSPITAL_COMMUNITY): Payer: Medicare Other

## 2017-09-07 ENCOUNTER — Emergency Department (HOSPITAL_COMMUNITY)
Admission: EM | Admit: 2017-09-07 | Discharge: 2017-09-07 | Disposition: A | Discharge status: Home / Self Care | Payer: Medicare Other | Attending: Emergency Medicine | Admitting: Emergency Medicine

## 2017-09-07 ENCOUNTER — Encounter (HOSPITAL_COMMUNITY): Payer: Self-pay

## 2017-09-07 ENCOUNTER — Other Ambulatory Visit: Payer: Self-pay

## 2017-09-07 DIAGNOSIS — Z79899 Other long term (current) drug therapy: Secondary | ICD-10-CM | POA: Diagnosis not present

## 2017-09-07 DIAGNOSIS — Z87891 Personal history of nicotine dependence: Secondary | ICD-10-CM | POA: Insufficient documentation

## 2017-09-07 DIAGNOSIS — Y92099 Unspecified place in other non-institutional residence as the place of occurrence of the external cause: Secondary | ICD-10-CM | POA: Diagnosis not present

## 2017-09-07 DIAGNOSIS — Z950 Presence of cardiac pacemaker: Secondary | ICD-10-CM | POA: Insufficient documentation

## 2017-09-07 DIAGNOSIS — D126 Benign neoplasm of colon, unspecified: Secondary | ICD-10-CM | POA: Insufficient documentation

## 2017-09-07 DIAGNOSIS — Z7982 Long term (current) use of aspirin: Secondary | ICD-10-CM | POA: Diagnosis not present

## 2017-09-07 DIAGNOSIS — S0993XA Unspecified injury of face, initial encounter: Secondary | ICD-10-CM | POA: Diagnosis present

## 2017-09-07 DIAGNOSIS — N183 Chronic kidney disease, stage 3 (moderate): Secondary | ICD-10-CM | POA: Insufficient documentation

## 2017-09-07 DIAGNOSIS — W010XXA Fall on same level from slipping, tripping and stumbling without subsequent striking against object, initial encounter: Secondary | ICD-10-CM | POA: Insufficient documentation

## 2017-09-07 DIAGNOSIS — S0083XA Contusion of other part of head, initial encounter: Secondary | ICD-10-CM

## 2017-09-07 DIAGNOSIS — Y9301 Activity, walking, marching and hiking: Secondary | ICD-10-CM | POA: Insufficient documentation

## 2017-09-07 DIAGNOSIS — S1093XA Contusion of unspecified part of neck, initial encounter: Secondary | ICD-10-CM | POA: Insufficient documentation

## 2017-09-07 DIAGNOSIS — M542 Cervicalgia: Secondary | ICD-10-CM | POA: Insufficient documentation

## 2017-09-07 DIAGNOSIS — Y999 Unspecified external cause status: Secondary | ICD-10-CM | POA: Diagnosis not present

## 2017-09-07 DIAGNOSIS — I129 Hypertensive chronic kidney disease with stage 1 through stage 4 chronic kidney disease, or unspecified chronic kidney disease: Secondary | ICD-10-CM | POA: Diagnosis not present

## 2017-09-07 DIAGNOSIS — E1122 Type 2 diabetes mellitus with diabetic chronic kidney disease: Secondary | ICD-10-CM | POA: Insufficient documentation

## 2017-09-07 DIAGNOSIS — S0121XA Laceration without foreign body of nose, initial encounter: Secondary | ICD-10-CM | POA: Diagnosis not present

## 2017-09-07 DIAGNOSIS — I2510 Atherosclerotic heart disease of native coronary artery without angina pectoris: Secondary | ICD-10-CM | POA: Diagnosis not present

## 2017-09-07 DIAGNOSIS — S0181XA Laceration without foreign body of other part of head, initial encounter: Secondary | ICD-10-CM

## 2017-09-07 DIAGNOSIS — Z85820 Personal history of malignant melanoma of skin: Secondary | ICD-10-CM | POA: Diagnosis not present

## 2017-09-07 LAB — URINALYSIS, ROUTINE W REFLEX MICROSCOPIC
Bilirubin Urine: NEGATIVE
Glucose, UA: NEGATIVE mg/dL
HGB URINE DIPSTICK: NEGATIVE
Ketones, ur: NEGATIVE mg/dL
LEUKOCYTES UA: NEGATIVE
Nitrite: NEGATIVE
Protein, ur: NEGATIVE mg/dL
SPECIFIC GRAVITY, URINE: 1.013 (ref 1.005–1.030)
pH: 5 (ref 5.0–8.0)

## 2017-09-07 NOTE — ED Provider Notes (Addendum)
[facility]ERG[facility]Y DEPARTMENT Provider Note   CSN: [id number] Arrival date & time: 09/07/17  1919     History   Chief Complaint Chief Complaint  Patient presents with  . Fall    HPI Eugene Long is a 84 y.o. male.  Patient fell while walking and states his legs gave out on him. Patient landed on his face. Not on any blood thinners. Abrasion to his face.   The history is provided by the patient.  Fall  This is a new problem. The current episode started less than 1 hour ago. The problem has not changed since onset.Pertinent negatives include no chest pain, no abdominal pain, no headaches and no shortness of breath. Nothing aggravates the symptoms. Nothing relieves the symptoms. He has tried nothing for the symptoms. The treatment provided no relief.    Past Medical History:  Diagnosis Date  . Actinic keratosis   . Acute venous embolism and thrombosis of unspecified deep vessels of lower extremity   . Anxiety   . Atrioventricular block, complete ([place])   . Benign neoplasm of colon    Polyp  . BPH (benign prostatic hypertrophy)   . Chronic kidney disease, stage II (mild) 12/10/2012  . Complete rupture of rotator cuff   . Contracture of palmar fascia   . Coronary atherosclerosis of native coronary artery   . Depressive disorder, not elsewhere classified   . Diabetes mellitus ([place])   . Diaphragmatic hernia without mention of obstruction or gangrene   . Diverticulosis    colon, Hx of  . Diverticulosis of colon (without mention of hemorrhage)   . Dysphagia, unspecified(787.20)   . Esophageal reflux   . Hypertension   . Hypertrophy of prostate with urinary obstruction and other lower urinary tract symptoms (LUTS)   . Insomnia, unspecified   . Lumbago   . 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 Osteoarthrosis, unspecified whether generalized or localized, unspecified site   . Other and unspecified hyperlipidemia   .  Other specified disorder of male genital organs(608.89)    right epididymal cyst  . Peripheral vascular disease, unspecified ([place])   . Stricture and stenosis of esophagus   . Unspecified constipation   . Unspecified hearing loss   . Unspecified pruritic disorder     Patient Active Problem List   Diagnosis Date Noted  . Syncope and collapse 09/03/2017  . Aortic stenosis, mild 09/03/2017  . CKD (chronic kidney disease) stage 3, GFR 30-59 ml/min (HCC) 02/14/2017  . Non-seasonal allergic rhinitis 02/14/2017  . Benign prostatic hyperplasia with weak urinary stream 02/14/2017  . Constipation 04/11/2016  . Leg cramp 04/11/2016  . Pruritus 06/08/2015  . Arthralgia 06/08/2015  . Macular degeneration 08/04/2014  . DM (diabetes mellitus), type 2 with peripheral vascular complications ([place]) 60/63/0160  . Type 2 DM with CKD stage 3 and hypertension ([place]) 11/26/2013  . DM type 2 with diabetic peripheral neuropathy ([place]) 11/26/2013  . Coronary atherosclerosis of native coronary artery   . Chest pain 09/23/2013  . Heart block   . Lumbago 06/21/2013  . Dupuytren's contracture 06/21/2013  . Presence of permanent cardiac pacemaker   . Melanoma of skin ([place])   . Other specified disorder of male genital organs(608.89)   . Chronic kidney disease, stage II (mild) 12/10/2012  . Fitting and adjustment of cardiac pacemaker 12/10/2012  . Contracture of palmar fascia 06/13/2012  . Palpitations 03/12/2012  . POLYP, COLON 02/25/2007  .  Hyperlipidemia 02/25/2007  . Depression 02/25/2007  . Essential hypertension 02/25/2007  . Non-ischemic cardiomyopathy ([place]) 02/25/2007  . Cardiac conduction disorder 02/25/2007  . PERIPHERAL VASCULAR DISEASE 02/25/2007  . DVT 02/25/2007  . Hiatal hernia with GERD 02/25/2007  . DEGENERATIVE JOINT DISEASE 02/25/2007  . ROTATOR CUFF TEAR 02/25/2007  . DIVERTICULOSIS, COLON, HX OF 02/25/2007  . BENIGN PROSTATIC HYPERTROPHY, HX OF 02/25/2007  . CAROTID ENDARTERECTOMY,  RIGHT, HX OF 02/25/2007    Past Surgical History:  Procedure Laterality Date  . APPENDECTOMY  1942  . arthoscopy right knee  1980  . CAROTID ENDARTERECTOMY  1987   right  . CATARACT EXTRACTION  [id number]  . COLONOSCOPY    . COLONOSCOPY  2005   Dr. Sharlett Iles  . ENUCLEATION  02/26/2009   left eye choroidal melanoma  . KNEE ARTHROSCOPY Left 1993  . PACEMAKER GENERATOR CHANGE  08/04/13   Dr. Lovena Le  . PACEMAKER INSERTION  2005; 08/04/2013   Dual chamber MDT pacemaker implanted by Dr Lovena Le in 2005 for CHB; gen change by Dr Lovena Le 07/2013 - MDT [id number]   . PERMANENT PACEMAKER GENERATOR CHANGE N/A 08/04/2013   Procedure: PERMANENT PACEMAKER GENERATOR CHANGE;  Surgeon: Evans Lance, MD;  Location: [facility] CATH LAB;  Service: Cardiovascular;  Laterality: N/A;  . TONSILLECTOMY  1931        Home Medications    Prior to Admission medications   Medication Sig Start Date End Date Taking? Authorizing Provider  acetaminophen (TYLENOL) 500 MG tablet Take 500 mg by mouth as needed (low back pain).    [provider]  aspirin 81 MG tablet Take 81 mg by mouth daily.    [provider]  cholecalciferol (VITAMIN D) 1000 UNITS tablet Take 2,000 Units by mouth daily.     [provider]  [facility] LANCETS THIN 26G MISC CHECK BLOOD SUGAR ONCE A DAY 05/07/17   Blanchie Serve, MD  docusate sodium (COLACE) 100 MG capsule Take 100 mg by mouth daily.    [provider]  finasteride (PROSCAR) 5 MG tablet TAKE 1 TABLET ONCE DAILY TO TREAT ENLARGED PROSTATE 06/18/17   Blanchie Serve, MD  isosorbide mononitrate (IMDUR) 30 MG 24 hr tablet Take 0.5 tablets (15 mg total) by mouth daily. 09/04/17 12/03/17  Daune Perch, NP  loratadine (CLARITIN) 10 MG tablet Take one tablet by mouth at bedtime for allergies    [provider]  metoprolol tartrate (LOPRESSOR) 25 MG tablet TAKE ONE AND ONE-HALF TABLETS TWICE A DAY 03/26/17   Blanchie Serve, MD  Multiple Vitamins-Minerals (ICAPS PO)  Take 1 capsule by mouth daily.     [provider]  nitroGLYCERIN (NITROSTAT) 0.4 MG SL tablet Place 1 tablet (0.4 mg total) under the tongue every 5 (five) minutes as needed for chest pain. 08/17/17 11/15/17  Evans Lance, MD  omeprazole (PRILOSEC) 40 MG capsule Take 40 mg by mouth daily.     [provider]  ONE TOUCH ULTRA TEST test strip CHECK BLOOD SUGAR ONCE DAILY. 08/09/16   Estill Dooms, MD  ONE TOUCH ULTRA TEST test strip CHECK BLOOD SUGAR ONCE DAILY. 03/05/17   Reed, Tiffany L, DO  pravastatin (PRAVACHOL) 10 MG tablet One daily to lower cholesterol 08/08/16   Estill Dooms, MD  sertraline (ZOLOFT) 50 MG tablet TAKE ONE-HALF (1/2) TABLET EVERY FIVE DAYS TO HELP ANXIETY AND DEPRESSION 05/20/17   Blanchie Serve, MD    Family History Family History  Problem Relation Age of Onset  . Liver cancer  Father   . Pancreatic cancer Father   . Cancer Father   . Heart disease Sister        CHF  . Diabetes Paternal Uncle     Social History Social History   Tobacco Use  . Smoking status: Former Smoker    Packs/day: 0.50    Years: 17.00    Pack years: 8.50    Types: Cigarettes    Last attempt to quit: 05/22/1960    Years since quitting: 57.3  . Smokeless tobacco: Never Used  Substance Use Topics  . Alcohol use: No  . Drug use: No     Allergies   Codeine; Glyburide; Lisinopril; Magnesium oxide; Uroxatral [alfuzosin]; Epinephrine; and Hctz [hydrochlorothiazide]   Review of Systems Review of Systems  Constitutional: Negative for chills and fever.  HENT: Negative for ear pain and sore throat.   Eyes: Negative for pain and visual disturbance.  Respiratory: Negative for cough and shortness of breath.   Cardiovascular: Negative for chest pain and palpitations.  Gastrointestinal: Negative for abdominal pain and vomiting.  Genitourinary: Negative for dysuria and hematuria.  Musculoskeletal: Negative for arthralgias and back pain.  Skin: Positive for wound.  Negative for color change and rash.  Neurological: Negative for seizures, syncope and headaches.  All other systems reviewed and are negative.    Physical Exam Updated Vital Signs  ED Triage Vitals  Enc Vitals Group     BP 09/07/17 1924 (!) 162/56     Pulse Rate 09/07/17 1924 67     Resp 09/07/17 1924 19     Temp 09/07/17 1924 98.2 F (36.8 C)     Temp Source 09/07/17 1924 Oral     SpO2 09/07/17 1924 99 %     Weight 09/07/17 1922 140 lb (63.5 kg)     Height 09/07/17 1922 5\' 8"  (1.727 m)     Head Circumference --      Peak Flow --      Pain Score 09/07/17 1922 3     Pain Loc --      Pain Edu? --      Excl. in [place]? --     Physical Exam  Constitutional: He appears well-developed and well-nourished.  HENT:  Head: Normocephalic and atraumatic.  Mouth/Throat: No oropharyngeal exudate.  Eyes: Pupils are equal, round, and reactive to light. Conjunctivae and EOM are normal.  Neck: Normal range of motion. Neck supple.  Cardiovascular: Normal rate, regular rhythm, normal heart sounds and intact distal pulses.  No murmur heard. Pulmonary/Chest: Effort normal and breath sounds normal. No stridor. No respiratory distress. He has no wheezes.  Abdominal: Soft. There is no tenderness.  Musculoskeletal: Normal range of motion. He exhibits tenderness (TTP to nose). He exhibits no edema.  Neurological: He is alert. No cranial nerve deficit or sensory deficit. He exhibits normal muscle tone. Coordination normal.  5+/5 strength, normal sensation, no drift, normal gait  Skin: Skin is warm and dry. Capillary refill takes less than 2 seconds. No rash noted.  Abrasion to nose and forehead  Psychiatric: He has a normal mood and affect.  Nursing note and vitals reviewed.    ED Treatments / Results  Labs (all labs ordered are listed, but only abnormal results are displayed) Labs Reviewed  URINALYSIS, ROUTINE W REFLEX MICROSCOPIC    EKG None  Radiology Ct Head Wo Contrast  Result Date:  09/07/2017 CLINICAL DATA:  Patient fell after dinner tripping and falling forward hitting nose on the ground. Abrasions noted. Bruising  of the neck. EXAM: CT HEAD WITHOUT CONTRAST CT MAXILLOFACIAL WITHOUT CONTRAST CT CERVICAL SPINE WITHOUT CONTRAST TECHNIQUE: Multidetector CT imaging of the head, cervical spine, and maxillofacial structures were performed using the standard protocol without intravenous contrast. Multiplanar CT image reconstructions of the cervical spine and maxillofacial structures were also generated. COMPARISON:  MRI of the head from 12/10/2003, cervical spine MRI 11/08/2003 FINDINGS: CT HEAD FINDINGS Brain: Superficial and central atrophy. No acute intracranial hemorrhage. Mild-to-moderate chronic small vessel ischemia of periventricular and subcortical white matter. No large vascular territory infarct. Midline fourth ventricle basal cisterns. Mild cerebellar atrophy. Unremarkable brainstem. Vascular: No hyperdense vessel sign. Skull: No skull fracture. Other: Soft tissue abrasion over the nose. CT MAXILLOFACIAL FINDINGS Osseous: No fracture or mandibular dislocation. No destructive process. Orbits: Left ocular prosthesis. Right lens up replacement. No retrobulbar abnormality. Sinuses: Mild ethmoid sinus mucosal thickening. No air-fluid levels of the paranasal sinuses. Soft tissues: Soft tissue abrasion over the bridge of the nose. CT CERVICAL SPINE FINDINGS Alignment: Normal cervical lordosis Skull base and vertebrae: No acute fracture. No primary bone lesion or focal pathologic process. Soft tissues and spinal canal: No prevertebral fluid or swelling. No visible canal hematoma. Disc levels: Moderate disc space narrowing C2 through C4 and mild disc space narrowing from C5 through T2. No significant central or foraminal encroachment. Upper chest: Negative. Other: None IMPRESSION: 1. Atrophy with chronic small vessel ischemia. No acute intracranial abnormality. 2. No acute cervical spine fracture  or posttraumatic listhesis. 3. Soft tissue abrasion of the bridge of the nose. No maxillofacial fracture. 4. Left ocular prosthetic. Electronically Signed   By: Ashley Royalty M.D.   On: 09/07/2017 21:06   Ct Cervical Spine Wo Contrast  Result Date: 09/07/2017 CLINICAL DATA:  Patient fell after dinner tripping and falling forward hitting nose on the ground. Abrasions noted. Bruising of the neck. EXAM: CT HEAD WITHOUT CONTRAST CT MAXILLOFACIAL WITHOUT CONTRAST CT CERVICAL SPINE WITHOUT CONTRAST TECHNIQUE: Multidetector CT imaging of the head, cervical spine, and maxillofacial structures were performed using the standard protocol without intravenous contrast. Multiplanar CT image reconstructions of the cervical spine and maxillofacial structures were also generated. COMPARISON:  MRI of the head from 12/10/2003, cervical spine MRI 11/08/2003 FINDINGS: CT HEAD FINDINGS Brain: Superficial and central atrophy. No acute intracranial hemorrhage. Mild-to-moderate chronic small vessel ischemia of periventricular and subcortical white matter. No large vascular territory infarct. Midline fourth ventricle basal cisterns. Mild cerebellar atrophy. Unremarkable brainstem. Vascular: No hyperdense vessel sign. Skull: No skull fracture. Other: Soft tissue abrasion over the nose. CT MAXILLOFACIAL FINDINGS Osseous: No fracture or mandibular dislocation. No destructive process. Orbits: Left ocular prosthesis. Right lens up replacement. No retrobulbar abnormality. Sinuses: Mild ethmoid sinus mucosal thickening. No air-fluid levels of the paranasal sinuses. Soft tissues: Soft tissue abrasion over the bridge of the nose. CT CERVICAL SPINE FINDINGS Alignment: Normal cervical lordosis Skull base and vertebrae: No acute fracture. No primary bone lesion or focal pathologic process. Soft tissues and spinal canal: No prevertebral fluid or swelling. No visible canal hematoma. Disc levels: Moderate disc space narrowing C2 through C4 and mild disc  space narrowing from C5 through T2. No significant central or foraminal encroachment. Upper chest: Negative. Other: None IMPRESSION: 1. Atrophy with chronic small vessel ischemia. No acute intracranial abnormality. 2. No acute cervical spine fracture or posttraumatic listhesis. 3. Soft tissue abrasion of the bridge of the nose. No maxillofacial fracture. 4. Left ocular prosthetic. Electronically Signed   By: Ashley Royalty M.D.   On: 09/07/2017 21:06  Ct Maxillofacial Wo Contrast  Result Date: 09/07/2017 CLINICAL DATA:  Patient fell after dinner tripping and falling forward hitting nose on the ground. Abrasions noted. Bruising of the neck. EXAM: CT HEAD WITHOUT CONTRAST CT MAXILLOFACIAL WITHOUT CONTRAST CT CERVICAL SPINE WITHOUT CONTRAST TECHNIQUE: Multidetector CT imaging of the head, cervical spine, and maxillofacial structures were performed using the standard protocol without intravenous contrast. Multiplanar CT image reconstructions of the cervical spine and maxillofacial structures were also generated. COMPARISON:  MRI of the head from 12/10/2003, cervical spine MRI 11/08/2003 FINDINGS: CT HEAD FINDINGS Brain: Superficial and central atrophy. No acute intracranial hemorrhage. Mild-to-moderate chronic small vessel ischemia of periventricular and subcortical white matter. No large vascular territory infarct. Midline fourth ventricle basal cisterns. Mild cerebellar atrophy. Unremarkable brainstem. Vascular: No hyperdense vessel sign. Skull: No skull fracture. Other: Soft tissue abrasion over the nose. CT MAXILLOFACIAL FINDINGS Osseous: No fracture or mandibular dislocation. No destructive process. Orbits: Left ocular prosthesis. Right lens up replacement. No retrobulbar abnormality. Sinuses: Mild ethmoid sinus mucosal thickening. No air-fluid levels of the paranasal sinuses. Soft tissues: Soft tissue abrasion over the bridge of the nose. CT CERVICAL SPINE FINDINGS Alignment: Normal cervical lordosis Skull base  and vertebrae: No acute fracture. No primary bone lesion or focal pathologic process. Soft tissues and spinal canal: No prevertebral fluid or swelling. No visible canal hematoma. Disc levels: Moderate disc space narrowing C2 through C4 and mild disc space narrowing from C5 through T2. No significant central or foraminal encroachment. Upper chest: Negative. Other: None IMPRESSION: 1. Atrophy with chronic small vessel ischemia. No acute intracranial abnormality. 2. No acute cervical spine fracture or posttraumatic listhesis. 3. Soft tissue abrasion of the bridge of the nose. No maxillofacial fracture. 4. Left ocular prosthetic. Electronically Signed   By: Ashley Royalty M.D.   On: 09/07/2017 21:06    Procedures .Marland KitchenLaceration Repair Date/Time: 09/07/2017 11:08 PM Performed by: Lennice Sites, DO Authorized by: Merryl Hacker, MD   Consent:    Consent obtained:  Verbal   Consent given by:  Patient   Risks discussed:  Infection, need for additional repair, pain, poor cosmetic result, poor wound healing, nerve damage, retained foreign body, tendon damage and vascular damage   Alternatives discussed:  No treatment Anesthesia (see MAR for exact dosages):    Anesthesia method:  None Laceration details:    Location: nose.   Length (cm):  1 Repair type:    Repair type:  Simple Pre-procedure details:    Preparation:  Patient was prepped and draped in usual sterile fashion Exploration:    Wound exploration: wound explored through full range of motion     Wound extent: no areolar tissue violation noted, no fascia violation noted, no foreign bodies/material noted, no muscle damage noted, no nerve damage noted, no tendon damage noted, no underlying fracture noted and no vascular damage noted   Treatment:    Area cleansed with:  Saline   Amount of cleaning:  Standard   Irrigation solution:  Sterile saline   Irrigation method:  Pressure wash   Visualized foreign bodies/material removed: no   Skin  repair:    Repair method:  Tissue adhesive and Steri-Strips   Number of Steri-Strips:  2 Approximation:    Approximation:  Close Post-procedure details:    Dressing:  Open (no dressing)   Patient tolerance of procedure:  Tolerated well, no immediate complications   (including critical care time)  Medications Ordered in ED Medications - No data to display   Initial Impression /  Assessment and Plan / ED Course  I have reviewed the triage vital signs and the nursing notes.  Pertinent labs & imaging results that were available during my care of the patient were reviewed by me and considered in my medical decision making (see chart for details).     An Schnabel Ravan is a 84 year old male with history of CKD, hypertension, anxiety who presents to the ED following a mechanical fall.  Patient with normal vitals.  Patient was walking when his legs gave out on him and he fell and hit his face.  Patient did not have loss of consciousness, no chest pain, no shortness of breath.  Patient is at his baseline.  He states that his legs just gave out on him.  He is neurologically intact.  He has abrasion over his nose that is hemostatic.  Patient has abrasion over his forehead as well.  He has old bruising as well.  Patient is able to ambulate without any issues in the ED. Will obtain head CT, neck CT, face CT to rule out any fractures or bleeds.  Patient with no abdominal pain, midline spinal pain, no bony tenderness on extremities.  Patient with unremarkable imaging.  No signs of acute fractures.  Wound on the nose was treated with Dermabond and the patient was discharged from the ED in good condition.  Final Clinical Impressions(s) / ED Diagnoses   Final diagnoses:  Contusion of face, initial encounter  Facial laceration, initial encounter    ED Discharge Orders    None       Lennice Sites, DO 09/07/17 [place], [place], DO 09/07/17 [id number]    Merryl Hacker, MD 09/07/17 [phone]

## 2017-09-07 NOTE — Discharge Instructions (Addendum)
Please keep face dry for 24 hours while wound heals. Bandage will fall off on its own.

## 2017-09-07 NOTE — ED Triage Notes (Signed)
Patient here by EMS from [facility] after a fall at dinner.  Patient tripped and fell forward hitting nose on the ground.  Abrasions noted. Bruising on neck from old fall.

## 2017-09-07 NOTE — ED Notes (Signed)
PT[facility] called for transport to [facility]

## 2017-09-07 NOTE — ED Notes (Signed)
Patient given discharge instructions and verbalized understanding.  Patient stable to discharge at this time.  Patient is alert and oriented to baseline.  No distressed noted at this time.  All belongings taken with the patient at discharge.   

## 2017-09-10 ENCOUNTER — Ambulatory Visit (INDEPENDENT_AMBULATORY_CARE_PROVIDER_SITE_OTHER): Payer: Medicare Other | Admitting: *Deleted

## 2017-09-10 ENCOUNTER — Telehealth: Payer: Self-pay | Admitting: Cardiology

## 2017-09-10 DIAGNOSIS — I442 Atrioventricular block, complete: Secondary | ICD-10-CM | POA: Diagnosis not present

## 2017-09-10 NOTE — Progress Notes (Signed)
Remote pacemaker transmission.   

## 2017-09-10 NOTE — Telephone Encounter (Signed)
Spoke with pt and reminded pt of remote transmission that is due today. Pt verbalized understanding.   

## 2017-09-11 ENCOUNTER — Encounter: Payer: Self-pay | Admitting: Cardiology

## 2017-09-11 LAB — CUP PACEART REMOTE DEVICE CHECK
Battery Remaining Longevity: 79 mo
Battery Voltage: 2.77 V
Brady Statistic AP VP Percent: 17 %
Brady Statistic AS VP Percent: 83 %
Implantable Lead Implant Date: 20050825
Implantable Lead Model: 5076
Implantable Pulse Generator Implant Date: 20150316
Lead Channel Impedance Value: 509 Ohm
Lead Channel Pacing Threshold Amplitude: 1 V
Lead Channel Setting Pacing Amplitude: 2 V
Lead Channel Setting Pacing Pulse Width: 0.4 ms
MDC IDC LEAD IMPLANT DT: 20050825
MDC IDC LEAD LOCATION: 753859
MDC IDC LEAD LOCATION: 753860
MDC IDC MSMT BATTERY IMPEDANCE: 403 Ohm
MDC IDC MSMT LEADCHNL RA PACING THRESHOLD PULSEWIDTH: 0.4 ms
MDC IDC MSMT LEADCHNL RV IMPEDANCE VALUE: 469 Ohm
MDC IDC MSMT LEADCHNL RV PACING THRESHOLD AMPLITUDE: 1.625 V
MDC IDC MSMT LEADCHNL RV PACING THRESHOLD PULSEWIDTH: 0.4 ms
MDC IDC SESS DTM: 20190422192831
MDC IDC SET LEADCHNL RV PACING AMPLITUDE: 3.25 V
MDC IDC SET LEADCHNL RV SENSING SENSITIVITY: 4 mV
MDC IDC STAT BRADY AP VS PERCENT: 0 %
MDC IDC STAT BRADY AS VS PERCENT: 0 %

## 2017-09-12 ENCOUNTER — Encounter: Payer: Self-pay | Admitting: Internal Medicine

## 2017-09-12 ENCOUNTER — Encounter: Payer: Medicare Other | Admitting: Internal Medicine

## 2017-09-12 ENCOUNTER — Ambulatory Visit (HOSPITAL_COMMUNITY): Payer: Medicare Other

## 2017-09-12 ENCOUNTER — Telehealth: Payer: Self-pay | Admitting: Cardiology

## 2017-09-12 ENCOUNTER — Non-Acute Institutional Stay (SKILLED_NURSING_FACILITY): Payer: Medicare Other | Admitting: Internal Medicine

## 2017-09-12 DIAGNOSIS — K219 Gastro-esophageal reflux disease without esophagitis: Secondary | ICD-10-CM

## 2017-09-12 DIAGNOSIS — K5901 Slow transit constipation: Secondary | ICD-10-CM

## 2017-09-12 DIAGNOSIS — I25118 Atherosclerotic heart disease of native coronary artery with other forms of angina pectoris: Secondary | ICD-10-CM | POA: Diagnosis not present

## 2017-09-12 DIAGNOSIS — I739 Peripheral vascular disease, unspecified: Secondary | ICD-10-CM

## 2017-09-12 DIAGNOSIS — R404 Transient alteration of awareness: Secondary | ICD-10-CM

## 2017-09-12 DIAGNOSIS — R300 Dysuria: Secondary | ICD-10-CM | POA: Diagnosis not present

## 2017-09-12 DIAGNOSIS — E1122 Type 2 diabetes mellitus with diabetic chronic kidney disease: Secondary | ICD-10-CM | POA: Diagnosis not present

## 2017-09-12 DIAGNOSIS — L270 Generalized skin eruption due to drugs and medicaments taken internally: Secondary | ICD-10-CM

## 2017-09-12 DIAGNOSIS — N183 Chronic kidney disease, stage 3 unspecified: Secondary | ICD-10-CM

## 2017-09-12 DIAGNOSIS — I459 Conduction disorder, unspecified: Secondary | ICD-10-CM | POA: Diagnosis not present

## 2017-09-12 DIAGNOSIS — K449 Diaphragmatic hernia without obstruction or gangrene: Secondary | ICD-10-CM

## 2017-09-12 DIAGNOSIS — R2681 Unsteadiness on feet: Secondary | ICD-10-CM

## 2017-09-12 DIAGNOSIS — R1310 Dysphagia, unspecified: Secondary | ICD-10-CM

## 2017-09-12 DIAGNOSIS — R531 Weakness: Secondary | ICD-10-CM

## 2017-09-12 DIAGNOSIS — I10 Essential (primary) hypertension: Secondary | ICD-10-CM

## 2017-09-12 DIAGNOSIS — I129 Hypertensive chronic kidney disease with stage 1 through stage 4 chronic kidney disease, or unspecified chronic kidney disease: Secondary | ICD-10-CM

## 2017-09-12 NOTE — Telephone Encounter (Signed)
New Message:    Pt have been placed in HealthCare at [facility].Pt is scheduled for an Echo on 09-17-17 at your facility .Dr Guinevere Ferrari wants the pt to have his Echo at [facility]. He will needs an approval from [facility] Ades to have Echo at [facility] please.

## 2017-09-12 NOTE — Progress Notes (Signed)
[facility]  Provider: Blanchie Serve MD   Location:  [place] Room Number: 3 Place of Service:  SNF (31)  PCP: Blanchie Serve, MD Patient Care Team: Blanchie Serve, MD as PCP - General (Internal Medicine) Evans Lance, MD as PCP - Cardiology (Cardiology) Shon Hough, MD as Consulting Physician (Ophthalmology) Irine Seal, MD as Consulting Physician (Urology) Sable Feil, MD as Consulting Physician (Gastroenterology) Gerarda Fraction, MD as Consulting Physician (Ophthalmology) [place], [facility] Evans Lance, MD (Cardiology) Sable Feil, MD as Consulting Physician (Gastroenterology) Rosendo Gros, Marnee [place] (Inactive) as Consulting Physician (Ophthalmology) Ngetich, Nelda Bucks, NP as Nurse Practitioner (Family Medicine)  Extended Emergency Contact Information Primary Emergency Contact: Carmell [place], [place] Phone: [phone] Relation: Daughter  Code Status: DNR  Goals of Care: Advanced Directive information Advanced Directives 09/12/2017  Does Patient Have a Medical Advance Directive? Yes  Type of Advance Directive Living will;Out of facility DNR (pink MOST or yellow form)  Does patient want to make changes to medical advance directive? No - Patient declined  Copy of [place] in Chart? -  Pre-existing out of facility DNR order (yellow form or pink MOST form) Yellow form placed in chart (order not valid for inpatient use)      Chief Complaint  Patient presents with  . New Admit To SNF    New Admission Visit     HPI: Patient is a 84 y.o. male seen today for admission visit. He was residing in independent living prior to this. He has had 2 falls in last 2 weeks and a syncopal episode with unclear etiology. He also had chest pain relieved by NTG thought to be anginal with MI. He was seen by cardiology, not a surgical candidate with advanced age and renal  function. He was recently started on imdur and prn NTG. He was taken off candasartan with a rash and keeping his age and renal function in mind. His rash has subsided. He was in ED recently with facial contusion and bruising from another fall on 09/07/17. He mentions that his legs gave away. Denies any syncope or chest pain during this episode. Due to his deconditioning and weakness, his daughter/HCPOA decided to transfer him to SNF for further care. He is seen in his room today. He appears weak and tired. Per daughter there has been increased confusion for a week. His po intake has varied and she has noticed change in color of his urine. He has medical history of CAD s/p CABG, complete heart block s/p pacemaker, aortic stenosis, hypertension, hyperlipidemia, CKD stage 3.  Past Medical History:  Diagnosis Date  . Actinic keratosis   . Acute venous embolism and thrombosis of unspecified deep vessels of lower extremity   . Anxiety   . Atrioventricular block, complete ([place])   . Benign neoplasm of colon    Polyp  . BPH (benign prostatic hypertrophy)   . Chronic kidney disease, stage II (mild) 12/10/2012  . Complete rupture of rotator cuff   . Contracture of palmar fascia   . Coronary atherosclerosis of native coronary artery   . Depressive disorder, not elsewhere classified   . Diabetes mellitus ([place])   . Diaphragmatic hernia without mention of obstruction or gangrene   . Diverticulosis    colon, Hx of  . Diverticulosis of colon (without mention of hemorrhage)   . Dysphagia, unspecified(787.20)   .  Esophageal reflux   . Hypertension   . Hypertrophy of prostate with urinary obstruction and other lower urinary tract symptoms (LUTS)   . Insomnia, unspecified   . Lumbago   . 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 Osteoarthrosis, unspecified whether generalized or localized, unspecified site   . Other and unspecified hyperlipidemia   . Other specified disorder of  male genital organs(608.89)    right epididymal cyst  . Peripheral vascular disease, unspecified ([place])   . Stricture and stenosis of esophagus   . Unspecified constipation   . Unspecified hearing loss   . Unspecified pruritic disorder    Past Surgical History:  Procedure Laterality Date  . APPENDECTOMY  1942  . arthoscopy right knee  1980  . CAROTID ENDARTERECTOMY  1987   right  . CATARACT EXTRACTION  [id number]  . COLONOSCOPY    . COLONOSCOPY  2005   Dr. Sharlett Iles  . ENUCLEATION  02/26/2009   left eye choroidal melanoma  . KNEE ARTHROSCOPY Left 1993  . PACEMAKER GENERATOR CHANGE  08/04/13   Dr. Lovena Le  . PACEMAKER INSERTION  2005; 08/04/2013   Dual chamber MDT pacemaker implanted by Dr Lovena Le in 2005 for CHB; gen change by Dr Lovena Le 07/2013 - MDT ADDRL1   . PERMANENT PACEMAKER GENERATOR CHANGE N/A 08/04/2013   Procedure: PERMANENT PACEMAKER GENERATOR CHANGE;  Surgeon: Evans Lance, MD;  Location: [facility] CATH LAB;  Service: Cardiovascular;  Laterality: N/A;  . TONSILLECTOMY  1931    reports that he quit smoking about 57 years ago. His smoking use included cigarettes. He has a 8.50 pack-year smoking history. He has never used smokeless tobacco. He reports that he does not drink alcohol or use drugs. Social History   Socioeconomic History  . Marital status: Married    Spouse name: Not on file  . Number of children: 3  . Years of education: Not on file  . Highest education level: Not on file  Occupational History  . Occupation: retired Pharmacist, community: RETIRED  Social Needs  . Financial resource strain: Not on file  . Food insecurity:    Worry: Not on file    Inability: Not on file  . Transportation needs:    Medical: Not on file    Non-medical: Not on file  Tobacco Use  . Smoking status: Former Smoker    Packs/day: 0.50    Years: 17.00    Pack years: 8.50    Types: Cigarettes    Last attempt to quit: 05/22/1960    Years since quitting: 57.3  . Smokeless  tobacco: Never Used  Substance and Sexual Activity  . Alcohol use: No  . Drug use: No  . Sexual activity: Never  Lifestyle  . Physical activity:    Days per week: Not on file    Minutes per session: Not on file  . Stress: Not on file  Relationships  . Social connections:    Talks on phone: Not on file    Gets together: Not on file    Attends religious service: Not on file    Active member of club or organization: Not on file    Attends meetings of clubs or organizations: Not on file    Relationship status: Not on file  . Intimate partner violence:    Fear of current or ex partner: Not on file    Emotionally abused: Not on file    Physically abused: Not  on file    Forced sexual activity: Not on file  Other Topics Concern  . Not on file  Social History Narrative   Patient lives at [facility] since [facility].   Widowed   Living will, POA   In [place]   Pacemaker   Previously worked in Research officer, trade union   Exercise walks 15-20 minutes twice a day most days     Family History  Problem Relation Age of Onset  . Liver cancer Father   . Pancreatic cancer Father   . Cancer Father   . Heart disease Sister        CHF  . Diabetes Paternal Uncle     Health Maintenance  Topic Date Due  . PNA vac Low Risk Adult (2 of 2 - PCV13) 05/22/2006  . TETANUS/TDAP  05/23/2007  . URINE MICROALBUMIN  04/03/2017  . INFLUENZA VACCINE  12/20/2017  . OPHTHALMOLOGY EXAM  12/21/2017  . HEMOGLOBIN A1C  01/13/2018  . FOOT EXAM  02/14/2018    Allergies  Allergen Reactions  . Candesartan Rash  . Codeine Other (See Comments)    Runny eyes and nose,  Teary eyes  . Glyburide Other (See Comments)    Unknown  . Lisinopril Itching  . Magnesium Oxide     Rash, itching  . Uroxatral [Alfuzosin] Other (See Comments)    Heart races  . Epinephrine Palpitations  . Hctz [Hydrochlorothiazide] Rash    Outpatient Encounter Medications as of 09/12/2017  Medication Sig  . acetaminophen  (TYLENOL) 500 MG tablet Take 500 mg by mouth as needed (low back pain).  Marland Kitchen aspirin 81 MG tablet Take 81 mg by mouth daily.  . cholecalciferol (VITAMIN D) 1000 UNITS tablet Take 2,000 Units by mouth daily.   Marland Kitchen docusate sodium (COLACE) 100 MG capsule Take 100 mg by mouth daily.  . finasteride (PROSCAR) 5 MG tablet TAKE 1 TABLET ONCE DAILY TO TREAT ENLARGED PROSTATE  . isosorbide mononitrate (IMDUR) 30 MG 24 hr tablet Take 0.5 tablets (15 mg total) by mouth daily.  Marland Kitchen loratadine (CLARITIN) 10 MG tablet Take 10 mg by mouth at bedtime as needed for allergies.   . metoprolol tartrate (LOPRESSOR) 25 MG tablet TAKE ONE AND ONE-HALF TABLETS TWICE A DAY  . Multiple Vitamins-Minerals (ICAPS PO) Take 1 capsule by mouth daily.   . nitroGLYCERIN (NITROSTAT) 0.4 MG SL tablet Place 1 tablet (0.4 mg total) under the tongue every 5 (five) minutes as needed for chest pain.  Marland Kitchen omeprazole (PRILOSEC) 40 MG capsule Take 40 mg by mouth daily.   . ONE TOUCH ULTRA TEST test strip CHECK BLOOD SUGAR ONCE DAILY.  . ONE TOUCH ULTRA TEST test strip CHECK BLOOD SUGAR ONCE DAILY.  . pravastatin (PRAVACHOL) 10 MG tablet One daily to lower cholesterol  . sertraline (ZOLOFT) 50 MG tablet TAKE ONE-HALF (1/2) TABLET EVERY FIVE DAYS TO HELP ANXIETY AND DEPRESSION  . [DISCONTINUED] [employer] LANCETS THIN 26G MISC CHECK BLOOD SUGAR ONCE A DAY   No facility-administered encounter medications on file as of 09/12/2017.     Review of Systems  Unable to perform ROS: Mental status change (some confusion noted this visit compared to last clinic visit. limited HPI and ROS)  Constitutional: Positive for fatigue. Negative for appetite change, chills and fever.       Has trouble swallowing with liquids at times and tends to cough up while drinking per daughter  HENT: Positive for hearing loss and trouble swallowing. Negative for congestion, mouth sores,  rhinorrhea and sore throat.   Eyes: Positive for visual disturbance.       Prosthetic left eye    Respiratory: Positive for cough. Negative for shortness of breath and wheezing.   Cardiovascular: Positive for leg swelling. Negative for chest pain and palpitations.       Daughter denies worsening of leg edema  Gastrointestinal: Positive for constipation. Negative for abdominal pain, diarrhea, nausea and vomiting.       Has a bowel movement every 2-3 days, takes stool softner  Genitourinary: Positive for dysuria, frequency, hematuria and urgency. Negative for flank pain.       Urinary complaints with increased frequency, urgency and dysuria for 2 weeks. Daughter thinks she noticed some blood in his urine few days back  Skin: Positive for rash.  Neurological: Positive for weakness. Negative for dizziness, seizures and headaches.  Hematological: Bruises/bleeds easily.  Psychiatric/Behavioral: Positive for confusion.    Vitals:   09/12/17 1452  BP: (!) 149/64  Pulse: 62  Resp: 18  Temp: (!) 96.3 F (35.7 C)  TempSrc: Oral  SpO2: 99%  Weight: 140 lb 8 oz (63.7 kg)  Height: 5\' 8"  (1.727 m)   Body mass index is 21.36 kg/m.   Wt Readings from Last 3 Encounters:  09/12/17 140 lb 8 oz (63.7 kg)  09/07/17 140 lb (63.5 kg)  09/04/17 140 lb (63.5 kg)   Physical Exam  Constitutional:  Thin built, frail male in no acute distress  HENT:  Head: Normocephalic and atraumatic.  Right Ear: External ear normal.  Left Ear: External ear normal.  Nose: Nose normal.  Mouth/Throat: Oropharynx is clear and moist. No oropharyngeal exudate.  Has hearing aids  Eyes: Right eye exhibits no discharge. Left eye exhibits no discharge.  Right eye PERRLA, EOMI and normal conjunctiva, left eye prosthesis  Neck: Normal range of motion. Neck supple.  Cardiovascular: Normal rate, regular rhythm and intact distal pulses.  Murmur heard. Pacemaker to left chest wall  Pulmonary/Chest: Effort normal and breath sounds normal. No respiratory distress. He has no wheezes. He has no rales.  Abdominal: Soft. Bowel  sounds are normal. There is no tenderness. There is no guarding.  Musculoskeletal: He exhibits edema and deformity.  Able to move all 4 extremities, generalized weakness, unsteady gait, uses walker, trace leg edema  Lymphadenopathy:    He has no cervical adenopathy.  Neurological: He is alert. He exhibits normal muscle tone.  Oriented to person, place and time but has slow thought process and taking time to answer question, needs redirection  Skin: Skin is warm and dry. He is not diaphoretic.  Macular erythematous rash to chest wall and arm, resolving, resolving bruise to neck  Psychiatric: He has a normal mood and affect.    Labs reviewed: Basic Metabolic Panel: Recent Labs    02/05/17 0800 07/16/17 0000 09/01/17 0941  NA 138 137 133*  K 4.5 4.7 4.4  CL 103 101 104  CO2 28 26 20*  GLUCOSE 87 104* 122*  BUN 31* 27* 32*  CREATININE 1.62* 1.62* 1.59*  CALCIUM 9.5 9.7 10.5*   Liver Function Tests: Recent Labs    02/05/17 0800 07/16/17 0000  AST 16 41*  ALT 10 40  BILITOT 0.7 0.8  PROT 6.3 6.2   No results for input(s): LIPASE, AMYLASE in the last 8760 hours. No results for input(s): AMMONIA in the last 8760 hours. CBC: Recent Labs    09/01/17 0941  WBC 6.6  HGB 11.5*  HCT 34.4*  MCV  92.2  PLT 168   Cardiac Enzymes: No results for input(s): CKTOTAL, CKMB, CKMBINDEX, TROPONINI in the last 8760 hours. BNP: Invalid input(s): POCBNP Lab Results  Component Value Date   HGBA1C 6.1 (H) 07/16/2017   No results found for: TSH No results found for: VITAMINB12 No results found for: FOLATE No results found for: IRON, TIBC, FERRITIN  Lipid Panel: Recent Labs    02/05/17 0800  CHOL 135  HDL 49  LDLCALC 69  TRIG 89  CHOLHDL 2.8   Lab Results  Component Value Date   HGBA1C 6.1 (H) 07/16/2017    Procedures since last visit: Dg Chest 2 View  Result Date: 09/01/2017 CLINICAL DATA:  Sudden onset chest pain. EXAM: CHEST - 2 VIEW COMPARISON:  Chest x-ray dated  July 08, 2009. FINDINGS: Unchanged left chest wall AICD. The heart size and mediastinal contours are within normal limits. Normal pulmonary vascularity. Atherosclerotic calcification of the aortic arch. No focal consolidation, pleural effusion, or pneumothorax. No acute osseous abnormality. IMPRESSION: No active cardiopulmonary disease. Electronically Signed   By: Titus Dubin M.D.   On: 09/01/2017 10:03   Ct Head Wo Contrast  Result Date: 09/07/2017 CLINICAL DATA:  Patient fell after dinner tripping and falling forward hitting nose on the ground. Abrasions noted. Bruising of the neck. EXAM: CT HEAD WITHOUT CONTRAST CT MAXILLOFACIAL WITHOUT CONTRAST CT CERVICAL SPINE WITHOUT CONTRAST TECHNIQUE: Multidetector CT imaging of the head, cervical spine, and maxillofacial structures were performed using the standard protocol without intravenous contrast. Multiplanar CT image reconstructions of the cervical spine and maxillofacial structures were also generated. COMPARISON:  MRI of the head from 12/10/2003, cervical spine MRI 11/08/2003 FINDINGS: CT HEAD FINDINGS Brain: Superficial and central atrophy. No acute intracranial hemorrhage. Mild-to-moderate chronic small vessel ischemia of periventricular and subcortical white matter. No large vascular territory infarct. Midline fourth ventricle basal cisterns. Mild cerebellar atrophy. Unremarkable brainstem. Vascular: No hyperdense vessel sign. Skull: No skull fracture. Other: Soft tissue abrasion over the nose. CT MAXILLOFACIAL FINDINGS Osseous: No fracture or mandibular dislocation. No destructive process. Orbits: Left ocular prosthesis. Right lens up replacement. No retrobulbar abnormality. Sinuses: Mild ethmoid sinus mucosal thickening. No air-fluid levels of the paranasal sinuses. Soft tissues: Soft tissue abrasion over the bridge of the nose. CT CERVICAL SPINE FINDINGS Alignment: Normal cervical lordosis Skull base and vertebrae: No acute fracture. No primary  bone lesion or focal pathologic process. Soft tissues and spinal canal: No prevertebral fluid or swelling. No visible canal hematoma. Disc levels: Moderate disc space narrowing C2 through C4 and mild disc space narrowing from C5 through T2. No significant central or foraminal encroachment. Upper chest: Negative. Other: None IMPRESSION: 1. Atrophy with chronic small vessel ischemia. No acute intracranial abnormality. 2. No acute cervical spine fracture or posttraumatic listhesis. 3. Soft tissue abrasion of the bridge of the nose. No maxillofacial fracture. 4. Left ocular prosthetic. Electronically Signed   By: Ashley Royalty M.D.   On: 09/07/2017 21:06   Ct Cervical Spine Wo Contrast  Result Date: 09/07/2017 CLINICAL DATA:  Patient fell after dinner tripping and falling forward hitting nose on the ground. Abrasions noted. Bruising of the neck. EXAM: CT HEAD WITHOUT CONTRAST CT MAXILLOFACIAL WITHOUT CONTRAST CT CERVICAL SPINE WITHOUT CONTRAST TECHNIQUE: Multidetector CT imaging of the head, cervical spine, and maxillofacial structures were performed using the standard protocol without intravenous contrast. Multiplanar CT image reconstructions of the cervical spine and maxillofacial structures were also generated. COMPARISON:  MRI of the head from 12/10/2003, cervical spine MRI 11/08/2003 FINDINGS:  CT HEAD FINDINGS Brain: Superficial and central atrophy. No acute intracranial hemorrhage. Mild-to-moderate chronic small vessel ischemia of periventricular and subcortical white matter. No large vascular territory infarct. Midline fourth ventricle basal cisterns. Mild cerebellar atrophy. Unremarkable brainstem. Vascular: No hyperdense vessel sign. Skull: No skull fracture. Other: Soft tissue abrasion over the nose. CT MAXILLOFACIAL FINDINGS Osseous: No fracture or mandibular dislocation. No destructive process. Orbits: Left ocular prosthesis. Right lens up replacement. No retrobulbar abnormality. Sinuses: Mild ethmoid  sinus mucosal thickening. No air-fluid levels of the paranasal sinuses. Soft tissues: Soft tissue abrasion over the bridge of the nose. CT CERVICAL SPINE FINDINGS Alignment: Normal cervical lordosis Skull base and vertebrae: No acute fracture. No primary bone lesion or focal pathologic process. Soft tissues and spinal canal: No prevertebral fluid or swelling. No visible canal hematoma. Disc levels: Moderate disc space narrowing C2 through C4 and mild disc space narrowing from C5 through T2. No significant central or foraminal encroachment. Upper chest: Negative. Other: None IMPRESSION: 1. Atrophy with chronic small vessel ischemia. No acute intracranial abnormality. 2. No acute cervical spine fracture or posttraumatic listhesis. 3. Soft tissue abrasion of the bridge of the nose. No maxillofacial fracture. 4. Left ocular prosthetic. Electronically Signed   By: Ashley Royalty M.D.   On: 09/07/2017 21:06   Ct Maxillofacial Wo Contrast  Result Date: 09/07/2017 CLINICAL DATA:  Patient fell after dinner tripping and falling forward hitting nose on the ground. Abrasions noted. Bruising of the neck. EXAM: CT HEAD WITHOUT CONTRAST CT MAXILLOFACIAL WITHOUT CONTRAST CT CERVICAL SPINE WITHOUT CONTRAST TECHNIQUE: Multidetector CT imaging of the head, cervical spine, and maxillofacial structures were performed using the standard protocol without intravenous contrast. Multiplanar CT image reconstructions of the cervical spine and maxillofacial structures were also generated. COMPARISON:  MRI of the head from 12/10/2003, cervical spine MRI 11/08/2003 FINDINGS: CT HEAD FINDINGS Brain: Superficial and central atrophy. No acute intracranial hemorrhage. Mild-to-moderate chronic small vessel ischemia of periventricular and subcortical white matter. No large vascular territory infarct. Midline fourth ventricle basal cisterns. Mild cerebellar atrophy. Unremarkable brainstem. Vascular: No hyperdense vessel sign. Skull: No skull fracture.  Other: Soft tissue abrasion over the nose. CT MAXILLOFACIAL FINDINGS Osseous: No fracture or mandibular dislocation. No destructive process. Orbits: Left ocular prosthesis. Right lens up replacement. No retrobulbar abnormality. Sinuses: Mild ethmoid sinus mucosal thickening. No air-fluid levels of the paranasal sinuses. Soft tissues: Soft tissue abrasion over the bridge of the nose. CT CERVICAL SPINE FINDINGS Alignment: Normal cervical lordosis Skull base and vertebrae: No acute fracture. No primary bone lesion or focal pathologic process. Soft tissues and spinal canal: No prevertebral fluid or swelling. No visible canal hematoma. Disc levels: Moderate disc space narrowing C2 through C4 and mild disc space narrowing from C5 through T2. No significant central or foraminal encroachment. Upper chest: Negative. Other: None IMPRESSION: 1. Atrophy with chronic small vessel ischemia. No acute intracranial abnormality. 2. No acute cervical spine fracture or posttraumatic listhesis. 3. Soft tissue abrasion of the bridge of the nose. No maxillofacial fracture. 4. Left ocular prosthetic. Electronically Signed   By: Ashley Royalty M.D.   On: 09/07/2017 21:06    Assessment/Plan  1. Generalized weakness Will have him work with physical therapy and occupational therapy team to help with gait training and muscle strengthening exercises.fall precautions. Skin care. Encourage to be out of bed.   2. Unsteady gait Will have patient work with PT/OT as tolerated to regain strength and restore function.  Fall precautions are in place.  3. Transient alteration  of awareness New for a week. With urinary complaints, send u/a with c/s and cbc with diff, cmp and TSH  4. Dysuria Send u/a with c/s, start pyridium 100 mg bid x 3 days, encourage hydration, maintain perineal hygiene  5. Type 2 DM with CKD stage 3 and hypertension ([place]) a1c in 2/19 6.1. Check cbg once a day for now  6. Essential hypertension Controlled HR. Continue  metoprolol tartrate 12.5 mg bid  7. PERIPHERAL VASCULAR DISEASE Ted hose to legs for edema and monitor, skin care. Continue aspirin  8. Heart block S/p pacemaker, supportive care  9. Atherosclerosis of native coronary artery of native heart with stable angina pectoris ([place]) Chest pain free this visit. Continue aspirin, metoprolol and imdur for now. Off ARB. Continue prn NTG. Continue pravastatin 10 mg daily. Order for echocardiogram as ordered by cardiology- pt can get it done at the facility, daughter to clarify with cardiology office if ok to have one here. Pending cardiology f/u.  10. Hiatal hernia with GERD Continue oemprazole 40 mg daily. SLP consult for dysphagia  11. CKD (chronic kidney disease) stage 3, GFR 30-59 ml/min (HCC) Monitor BMP and CBC  12. Slow transit constipation Continue colace 100 mg daily. Add miralax every 3 days as needed for constipation. Maintain and encourage hydration  13. Dysphagia, unspecified type SLP consult, aspiration precautions  14. Drug rash Has resolving rash to chest and right arm. Thought to be from drug reaction to candasartan that he has been taken off.    Labs/tests ordered:  Cbc with diff, cmp, TSH  Communication: reviewed care plan with patient, his daughter and charge nurse.     Blanchie Serve, MD Internal Medicine [facility] [address] [place], [place] [address] Cell Phone (Monday-Friday 8 am - 5 pm): [phone] On Call: [phone] and follow prompts after 5 pm and on weekends Office Phone: [phone] Office Fax: [phone]

## 2017-09-12 NOTE — Telephone Encounter (Signed)
Spoke to patient's daughter in regards to patient's scheduled Echo 4/29.   His condition is really declining and Dr Guinevere Ferrari, along with the daughter are requesting that the Echo be done at [facility] with your approval.   The daughter wanted to know if it is really necessary at his age along with the decline in health.   Please advise, thank you.

## 2017-09-13 ENCOUNTER — Encounter: Payer: Self-pay | Admitting: Internal Medicine

## 2017-09-13 LAB — HEPATIC FUNCTION PANEL
ALT: 45 — AB (ref 10–40)
AST: 51 — AB (ref 14–40)
Alkaline Phosphatase: 157 — AB (ref 25–125)
Bilirubin, Total: 1.4

## 2017-09-13 LAB — URINALYSIS
BILIRUBIN (URINE): NEGATIVE
GLUCOSE: NEGATIVE
Hyaline Casts, UA: NONE SEEN
KETONES: NEGATIVE
NITRITE: POSITIVE
OCCULT BLOOD: NEGATIVE
PROTEIN UR: NEGATIVE
Specific Gravity: 1.024
pH: 5

## 2017-09-13 LAB — COMPLETE METABOLIC PANEL WITH GFR
ALBUMIN: 3
ALK PHOS: 157
ALT: 45
ALT: 45
AST: 51
AST: 51
Albumin: 3
Alkaline Phosphatase: 157
BILIRUBIN TOTAL: 1.4
BILIRUBIN TOTAL: 1.4
BUN: 35 — AB (ref 4–21)
BUN: 35 — AB (ref 4–21)
Calcium: 11.2
Calcium: 11.2
Creat: 1.7
Creat: 1.7
GLUCOSE: 77
GLUCOSE: 77
POTASSIUM: 4.5
Potassium: 4.5
SODIUM: 135
Sodium: 135
TOTAL PROTEIN: 5.1 g/dL
Total Protein: 5.1 g/dL

## 2017-09-13 LAB — CBC
HCT: 30.8
HCT: 30.8
HEMOGLOBIN: 10.7
HGB: 10.7
PLATELET COUNT: 152
WBC: 7.6
WBC: 7.6
platelet count: 152

## 2017-09-13 LAB — BASIC METABOLIC PANEL
BUN: 35 — AB (ref 4–21)
CREATININE: 1.7 — AB (ref 0.6–1.3)
GLUCOSE: 77
POTASSIUM: 4.5 (ref 3.4–5.3)
Sodium: 135 — AB (ref 137–147)

## 2017-09-13 LAB — CBC AND DIFFERENTIAL
HCT: 31 — AB (ref 41–53)
Hemoglobin: 10.7 — AB (ref 13.5–17.5)
Platelets: 152 (ref 150–399)
WBC: 7.6

## 2017-09-13 LAB — TSH
TSH: 3.05
TSH: 3.05 (ref 0.41–5.90)

## 2017-09-13 NOTE — Telephone Encounter (Signed)
This patient can have echo at [facility]. If the daughter does not wish for pt to have echo, that is reasonable and can cancel. It would only provide information. I do not think that it would change his care much unless the patient and family want to get more aggressive.   I will route this to Algie Coffer.   Thank you Adrien Shankar Gae Bon) Phylliss Bob, NP

## 2017-09-14 ENCOUNTER — Encounter: Payer: Self-pay | Admitting: *Deleted

## 2017-09-17 ENCOUNTER — Encounter: Payer: Self-pay | Admitting: Family

## 2017-09-17 ENCOUNTER — Non-Acute Institutional Stay (SKILLED_NURSING_FACILITY): Payer: Medicare Other | Admitting: Family

## 2017-09-17 ENCOUNTER — Ambulatory Visit (HOSPITAL_COMMUNITY): Payer: Medicare Other

## 2017-09-17 DIAGNOSIS — E440 Moderate protein-calorie malnutrition: Secondary | ICD-10-CM | POA: Diagnosis not present

## 2017-09-17 DIAGNOSIS — H04122 Dry eye syndrome of left lacrimal gland: Secondary | ICD-10-CM | POA: Diagnosis not present

## 2017-09-17 DIAGNOSIS — D649 Anemia, unspecified: Secondary | ICD-10-CM

## 2017-09-17 DIAGNOSIS — Z7189 Other specified counseling: Secondary | ICD-10-CM | POA: Diagnosis not present

## 2017-09-17 DIAGNOSIS — R748 Abnormal levels of other serum enzymes: Secondary | ICD-10-CM | POA: Diagnosis not present

## 2017-09-17 DIAGNOSIS — E8809 Other disorders of plasma-protein metabolism, not elsewhere classified: Secondary | ICD-10-CM

## 2017-09-17 NOTE — ACP (Advance Care Planning) (Signed)
Patient has a DNR,living will and HCPOA  in chart.Forms reviewed in the presence of Patient's two daughters, son in Sports coach and facility social worker during the [place].Patient's POA wishes to continue with DNR in case of a cardiopulmonary event.Also would like to fill out MOST form.Patient's family would like comfort measures and transfer to the hospital only if comfort needs cannot be met in the current facility.Family agrees to use of antibiotics and IVF fluids if indicated but no feeding tube.Signed MOST form placed in patient's chart by facility social worker.I've spent time from 11:00 -11:20 Am reviewing goals of care today.All questions answered to the best of my knowledge.MOST form to be reviewed in one year and as needed by POA.

## 2017-09-17 NOTE — Progress Notes (Signed)
Location:  [place] Room Number: 3 Place of Service:  SNF [phone]) Provider: Dinah Ngetich FNP-C  Blanchie Serve, MD  Patient Care Team: Blanchie Serve, MD as PCP - General (Internal Medicine) Evans Lance, MD as PCP - Cardiology (Cardiology) Shon Hough, MD as Consulting Physician (Ophthalmology) Irine Seal, MD as Consulting Physician (Urology) Sable Feil, MD as Consulting Physician (Gastroenterology) Gerarda Fraction, MD as Consulting Physician (Ophthalmology) [place], [facility] Evans Lance, MD (Cardiology) Sable Feil, MD as Consulting Physician (Gastroenterology) Rosendo Gros, Marnee Spring (Inactive) as Consulting Physician (Ophthalmology) Ngetich, Nelda Bucks, NP as Nurse Practitioner (Family Medicine)  Extended Emergency Contact Information Primary Emergency Contact: Carmell [place], [place] Phone: [phone] Relation: Daughter  Code Status:  DNR Goals of care: Advanced Directive information Advanced Directives 09/17/2017  Does Patient Have a Medical Advance Directive? Yes  Type of Advance Directive Living will;Out of facility DNR (pink MOST or yellow form)  Does patient want to make changes to medical advance directive? -  Copy of [place] in Chart? -  Pre-existing out of facility DNR order (yellow form or pink MOST form) -     Chief Complaint  Patient presents with  . Acute Visit    Goals of care meeting and check left eye (yellow drainage); abnormal labs    HPI:  Pt is a 84 y.o. 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 Recent lab resulted BUN 35,CR1.70 ( previous BUN 27,CR 1.62) TP 5.1,Alb 3.0,Bilirubin 1.4,ALK phos 157,AST51 previous 41,Hgb 10.7 (09/17/2017). Advanced care planning meeting held in presence of two patient's daughter HCPOA,son in law and facility social worker.Patient current DNR,HCPOA,living will paperwork reviewed.POA would like patient to remain a DNR in case of a cardiopulmonary arrest.MOST form discussed with family and signed today.    Past Medical History:  Diagnosis Date  . Actinic keratosis   . Acute venous embolism and thrombosis of unspecified deep vessels of lower extremity   . Anxiety   . Atrioventricular block, complete ([place])   . Benign neoplasm of colon    Polyp  . BPH (benign prostatic hypertrophy)   . Chronic kidney disease, stage II (mild) 12/10/2012  . Complete rupture of rotator cuff   . Contracture of palmar fascia   . Coronary atherosclerosis of native coronary artery   . Depressive disorder, not elsewhere classified   . Diabetes mellitus ([place])   . Diaphragmatic hernia without mention of obstruction or gangrene   . Diverticulosis    colon, Hx of  . Diverticulosis of colon (without mention of hemorrhage)   . Dysphagia, unspecified(787.20)   . Esophageal reflux   . Hypertension   . Hypertrophy of prostate with urinary obstruction and other lower urinary tract symptoms (LUTS)   . Insomnia, unspecified   . Lumbago   . 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  Osteoarthrosis, unspecified whether generalized or localized, unspecified site   . Other and unspecified hyperlipidemia   . Other specified disorder of male genital organs(608.89)    right epididymal cyst  . Peripheral vascular disease, unspecified ([place])   . Stricture and stenosis  of esophagus   . Unspecified constipation   . Unspecified hearing loss   . Unspecified pruritic disorder    Past Surgical History:  Procedure Laterality Date  . APPENDECTOMY  1942  . arthoscopy right knee  1980  . CAROTID ENDARTERECTOMY  1987   right  . CATARACT EXTRACTION  [id number]  . COLONOSCOPY    . COLONOSCOPY  2005   Dr. Sharlett Iles  . ENUCLEATION  02/26/2009   left eye choroidal melanoma  . KNEE ARTHROSCOPY Left 1993  . PACEMAKER GENERATOR CHANGE  08/04/13   Dr. Lovena Le  . PACEMAKER INSERTION  2005; 08/04/2013   Dual chamber MDT pacemaker implanted by Dr Lovena Le in 2005 for CHB; gen change by Dr Lovena Le 07/2013 - MDT ADDRL1   . PERMANENT PACEMAKER GENERATOR CHANGE N/A 08/04/2013   Procedure: PERMANENT PACEMAKER GENERATOR CHANGE;  Surgeon: Evans Lance, MD;  Location: [facility] CATH LAB;  Service: Cardiovascular;  Laterality: N/A;  . TONSILLECTOMY  1931    Allergies  Allergen Reactions  . Candesartan Rash  . Codeine Other (See Comments)    Runny eyes and nose,  Teary eyes  . Glyburide Other (See Comments)    Unknown  . Lisinopril Itching  . Magnesium Oxide     Rash, itching  . Uroxatral [Alfuzosin] Other (See Comments)    Heart races  . Epinephrine Palpitations  . Hctz [Hydrochlorothiazide] Rash    Outpatient Encounter Medications as of 09/17/2017  Medication Sig  . acetaminophen (TYLENOL) 500 MG tablet Take 500 mg by mouth as needed (low back pain).  Marland Kitchen aspirin 81 MG tablet Take 81 mg by mouth daily.  . cholecalciferol (VITAMIN D) 1000 UNITS tablet Take 2,000 Units by mouth daily.   Marland Kitchen docusate sodium (COLACE) 100 MG capsule Take 100 mg by mouth daily.  . finasteride (PROSCAR) 5 MG tablet TAKE 1 TABLET ONCE DAILY TO TREAT ENLARGED PROSTATE  . isosorbide mononitrate (IMDUR) 30 MG 24 hr tablet Take 0.5 tablets (15 mg total) by mouth daily.  Marland Kitchen loratadine (CLARITIN) 10 MG tablet Take 10 mg by mouth at bedtime as needed for allergies.   . metoprolol tartrate (LOPRESSOR) 25 MG  tablet TAKE ONE AND ONE-HALF TABLETS TWICE A DAY  . Multiple Vitamins-Minerals (ICAPS PO) Take 1 capsule by mouth daily.   . nitroGLYCERIN (NITROSTAT) 0.4 MG SL tablet Place 1 tablet (0.4 mg total) under the tongue every 5 (five) minutes as needed for chest pain.  Marland Kitchen omeprazole (PRILOSEC) 40 MG capsule Take 40 mg by mouth daily.   . ONE TOUCH ULTRA TEST test strip CHECK BLOOD SUGAR ONCE DAILY.  . ONE TOUCH ULTRA TEST test strip CHECK BLOOD SUGAR ONCE DAILY.  . pravastatin (PRAVACHOL) 10 MG tablet One daily to lower cholesterol  . sertraline (ZOLOFT) 50 MG tablet TAKE ONE-HALF (1/2) TABLET EVERY FIVE DAYS TO HELP ANXIETY AND DEPRESSION   No facility-administered encounter medications on file as of 09/17/2017.     Review of Systems  Constitutional: Positive for fatigue. Negative for chills and fever.  HENT: Negative for congestion, rhinorrhea, sinus pressure, sinus pain, sneezing and sore throat.   Eyes: Positive for discharge. Negative for pain, redness and itching.       Left eye prosthetic  Respiratory: Negative  for cough, chest tightness, shortness of breath and wheezing.   Cardiovascular: Positive for leg swelling. Negative for chest pain and palpitations.  Gastrointestinal: Negative for abdominal distention, abdominal pain, constipation, diarrhea, nausea and vomiting.  Genitourinary: Positive for dysuria, frequency and urgency.  Musculoskeletal: Positive for gait problem.  Skin: Negative for color change, pallor and rash.  Neurological: Positive for weakness. Negative for dizziness, light-headedness and headaches.  Psychiatric/Behavioral: Positive for confusion. Negative for agitation and sleep disturbance. The patient is not nervous/anxious.     Immunization History  Administered Date(s) Administered  . Influenza Whole 02/20/2012, 02/20/2013  . Influenza, High Dose Seasonal PF 02/28/2017  . Influenza-Unspecified 03/05/2014, 02/18/2015, 03/02/2016  . Pneumococcal Polysaccharide-23  05/22/2005  . Td 05/22/1997   Pertinent  Health Maintenance Due  Topic Date Due  . PNA vac Low Risk Adult (2 of 2 - PCV13) 05/22/2006  . URINE MICROALBUMIN  04/03/2017  . INFLUENZA VACCINE  12/20/2017  . OPHTHALMOLOGY EXAM  12/21/2017  . HEMOGLOBIN A1C  01/13/2018  . FOOT EXAM  02/14/2018   Fall Risk  03/14/2017 02/14/2017 12/07/2015 06/08/2015 08/04/2014  Falls in the past year? No No No No No  Risk for fall due to : - Medication side effect - - -    Vitals:   09/17/17 1029  BP: (!) 129/52  Pulse: 77  Resp: (!) 22  Temp: (!) 97.2 F (36.2 C)  SpO2: 96%  Weight: 140 lb 8 oz (63.7 kg)  Height: 5' 8"  (1.727 m)   Body mass index is 21.36 kg/m. Physical Exam  Constitutional:  Thin built,frail elderly in no acute distress   HENT:  Head: Normocephalic.  Mouth/Throat: Oropharynx is clear and moist. No oropharyngeal exudate.  Eyes: Right eye exhibits no discharge. Left eye exhibits discharge. No scleral icterus.  Left eye prosthetic crusty yellow drainage noted on eyelashes.Right eye pupil round and reactive to light.EOM normal on right eye.   Neck: Normal range of motion. No JVD present. No thyromegaly present.  Cardiovascular: Intact distal pulses. Exam reveals no gallop and no friction rub.  Murmur heard. Pulmonary/Chest: Effort normal and breath sounds normal. No respiratory distress. He has no wheezes. He has no rales. He exhibits no tenderness.  Abdominal: Soft. Bowel sounds are normal. He exhibits no distension and no mass. There is no tenderness. There is no rebound and no guarding.  Musculoskeletal: He exhibits no tenderness.  Unsteady gait transfers from wheelchair with assistance.Bilateral lower extremities pitting edema.Knee high ted hose in place   Lymphadenopathy:    He has no cervical adenopathy.  Neurological: Gait abnormal.  Alert and oriented to person,place except time.  Skin: Skin is warm and dry. No rash noted. No erythema. No pallor.  Psychiatric: He has a  normal mood and affect. He is slowed. Cognition and memory are impaired.  Speech slow to respond     Labs reviewed: Recent Labs    02/05/17 0800 07/16/17 0000 09/01/17 0941 09/13/17  NA 138 137 133* 135  135  K 4.5 4.7 4.4 4.5  4.5  CL 103 101 104  --   CO2 28 26 20*  --   GLUCOSE 87 104* 122*  --   BUN 31* 27* 32* 35*  35*  CREATININE 1.62* 1.62* 1.59* 1.70  1.70  CALCIUM 9.5 9.7 10.5* 11.2  11.2   Recent Labs    02/05/17 0800 07/16/17 0000 09/13/17  AST 16 41* 51  51  ALT 10 40 45  45  ALKPHOS  --   --  157  157  BILITOT 0.7 0.8 1.4  1.4  PROT 6.3 6.2 5.1  5.1  ALBUMIN  --   --  3  3   Recent Labs    09/01/17 0941 09/13/17  WBC 6.6 0-5  7.6  7.6  HGB 11.5* 10.7  10.7  HCT 34.4* 30.8  30.8  MCV 92.2  --   PLT 168  --    Lab Results  Component Value Date   TSH 3.05 09/13/2017   Lab Results  Component Value Date   HGBA1C 6.1 (H) 07/16/2017   Lab Results  Component Value Date   CHOL 135 02/05/2017   HDL 49 02/05/2017   LDLCALC 69 02/05/2017   TRIG 89 02/05/2017   CHOLHDL 2.8 02/05/2017    Significant Diagnostic Results in last 30 days:  Dg Chest 2 View  Result Date: 09/01/2017 CLINICAL DATA:  Sudden onset chest pain. EXAM: CHEST - 2 VIEW COMPARISON:  Chest x-ray dated July 08, 2009. FINDINGS: Unchanged left chest wall AICD. The heart size and mediastinal contours are within normal limits. Normal pulmonary vascularity. Atherosclerotic calcification of the aortic arch. No focal consolidation, pleural effusion, or pneumothorax. No acute osseous abnormality. IMPRESSION: No active cardiopulmonary disease. Electronically Signed   By: Titus Dubin M.D.   On: 09/01/2017 10:03   Ct Head Wo Contrast  Result Date: 09/07/2017 CLINICAL DATA:  Patient fell after dinner tripping and falling forward hitting nose on the ground. Abrasions noted. Bruising of the neck. EXAM: CT HEAD WITHOUT CONTRAST CT MAXILLOFACIAL WITHOUT CONTRAST CT CERVICAL SPINE  WITHOUT CONTRAST TECHNIQUE: Multidetector CT imaging of the head, cervical spine, and maxillofacial structures were performed using the standard protocol without intravenous contrast. Multiplanar CT image reconstructions of the cervical spine and maxillofacial structures were also generated. COMPARISON:  MRI of the head from 12/10/2003, cervical spine MRI 11/08/2003 FINDINGS: CT HEAD FINDINGS Brain: Superficial and central atrophy. No acute intracranial hemorrhage. Mild-to-moderate chronic small vessel ischemia of periventricular and subcortical white matter. No large vascular territory infarct. Midline fourth ventricle basal cisterns. Mild cerebellar atrophy. Unremarkable brainstem. Vascular: No hyperdense vessel sign. Skull: No skull fracture. Other: Soft tissue abrasion over the nose. CT MAXILLOFACIAL FINDINGS Osseous: No fracture or mandibular dislocation. No destructive process. Orbits: Left ocular prosthesis. Right lens up replacement. No retrobulbar abnormality. Sinuses: Mild ethmoid sinus mucosal thickening. No air-fluid levels of the paranasal sinuses. Soft tissues: Soft tissue abrasion over the bridge of the nose. CT CERVICAL SPINE FINDINGS Alignment: Normal cervical lordosis Skull base and vertebrae: No acute fracture. No primary bone lesion or focal pathologic process. Soft tissues and spinal canal: No prevertebral fluid or swelling. No visible canal hematoma. Disc levels: Moderate disc space narrowing C2 through C4 and mild disc space narrowing from C5 through T2. No significant central or foraminal encroachment. Upper chest: Negative. Other: None IMPRESSION: 1. Atrophy with chronic small vessel ischemia. No acute intracranial abnormality. 2. No acute cervical spine fracture or posttraumatic listhesis. 3. Soft tissue abrasion of the bridge of the nose. No maxillofacial fracture. 4. Left ocular prosthetic. Electronically Signed   By: Ashley Royalty M.D.   On: 09/07/2017 21:06   Ct Cervical Spine Wo  Contrast  Result Date: 09/07/2017 CLINICAL DATA:  Patient fell after dinner tripping and falling forward hitting nose on the ground. Abrasions noted. Bruising of the neck. EXAM: CT HEAD WITHOUT CONTRAST CT MAXILLOFACIAL WITHOUT CONTRAST CT CERVICAL SPINE WITHOUT CONTRAST TECHNIQUE: Multidetector CT imaging of the head, cervical spine, and maxillofacial structures were performed  using the standard protocol without intravenous contrast. Multiplanar CT image reconstructions of the cervical spine and maxillofacial structures were also generated. COMPARISON:  MRI of the head from 12/10/2003, cervical spine MRI 11/08/2003 FINDINGS: CT HEAD FINDINGS Brain: Superficial and central atrophy. No acute intracranial hemorrhage. Mild-to-moderate chronic small vessel ischemia of periventricular and subcortical white matter. No large vascular territory infarct. Midline fourth ventricle basal cisterns. Mild cerebellar atrophy. Unremarkable brainstem. Vascular: No hyperdense vessel sign. Skull: No skull fracture. Other: Soft tissue abrasion over the nose. CT MAXILLOFACIAL FINDINGS Osseous: No fracture or mandibular dislocation. No destructive process. Orbits: Left ocular prosthesis. Right lens up replacement. No retrobulbar abnormality. Sinuses: Mild ethmoid sinus mucosal thickening. No air-fluid levels of the paranasal sinuses. Soft tissues: Soft tissue abrasion over the bridge of the nose. CT CERVICAL SPINE FINDINGS Alignment: Normal cervical lordosis Skull base and vertebrae: No acute fracture. No primary bone lesion or focal pathologic process. Soft tissues and spinal canal: No prevertebral fluid or swelling. No visible canal hematoma. Disc levels: Moderate disc space narrowing C2 through C4 and mild disc space narrowing from C5 through T2. No significant central or foraminal encroachment. Upper chest: Negative. Other: None IMPRESSION: 1. Atrophy with chronic small vessel ischemia. No acute intracranial abnormality. 2. No acute  cervical spine fracture or posttraumatic listhesis. 3. Soft tissue abrasion of the bridge of the nose. No maxillofacial fracture. 4. Left ocular prosthetic. Electronically Signed   By: Ashley Royalty M.D.   On: 09/07/2017 21:06   Ct Maxillofacial Wo Contrast  Result Date: 09/07/2017 CLINICAL DATA:  Patient fell after dinner tripping and falling forward hitting nose on the ground. Abrasions noted. Bruising of the neck. EXAM: CT HEAD WITHOUT CONTRAST CT MAXILLOFACIAL WITHOUT CONTRAST CT CERVICAL SPINE WITHOUT CONTRAST TECHNIQUE: Multidetector CT imaging of the head, cervical spine, and maxillofacial structures were performed using the standard protocol without intravenous contrast. Multiplanar CT image reconstructions of the cervical spine and maxillofacial structures were also generated. COMPARISON:  MRI of the head from 12/10/2003, cervical spine MRI 11/08/2003 FINDINGS: CT HEAD FINDINGS Brain: Superficial and central atrophy. No acute intracranial hemorrhage. Mild-to-moderate chronic small vessel ischemia of periventricular and subcortical white matter. No large vascular territory infarct. Midline fourth ventricle basal cisterns. Mild cerebellar atrophy. Unremarkable brainstem. Vascular: No hyperdense vessel sign. Skull: No skull fracture. Other: Soft tissue abrasion over the nose. CT MAXILLOFACIAL FINDINGS Osseous: No fracture or mandibular dislocation. No destructive process. Orbits: Left ocular prosthesis. Right lens up replacement. No retrobulbar abnormality. Sinuses: Mild ethmoid sinus mucosal thickening. No air-fluid levels of the paranasal sinuses. Soft tissues: Soft tissue abrasion over the bridge of the nose. CT CERVICAL SPINE FINDINGS Alignment: Normal cervical lordosis Skull base and vertebrae: No acute fracture. No primary bone lesion or focal pathologic process. Soft tissues and spinal canal: No prevertebral fluid or swelling. No visible canal hematoma. Disc levels: Moderate disc space narrowing C2  through C4 and mild disc space narrowing from C5 through T2. No significant central or foraminal encroachment. Upper chest: Negative. Other: None IMPRESSION: 1. Atrophy with chronic small vessel ischemia. No acute intracranial abnormality. 2. No acute cervical spine fracture or posttraumatic listhesis. 3. Soft tissue abrasion of the bridge of the nose. No maxillofacial fracture. 4. Left ocular prosthetic. Electronically Signed   By: Ashley Royalty M.D.   On: 09/07/2017 21:06   ECHO result 09/13/2017:  Impression: Mild aortic valve regurgitation,mild mitral valve regurgitation,mild tricuspid regurgitation with mild pulmonary Hypertension.Normal LV systolic functions with doppler hemodynamics consistent with abnormal relaxation.  Assessment/Plan 1. Last ACP Note 06/19/2017 to 09/17/2017       ACP (Advance Care Planning) by Sandrea Hughs, NP at 09/17/2017 10:28 AM    Date of Service   Author Author Type Status Note Type File Time  09/17/2017 Addend Ngetich, Nelda Bucks, NP Nurse Practitioner Signed ACP (Advance Care Planning) 09/17/2017             Patient has a DNR,living will and HCPOA  in chart.Forms reviewed in the presence of Patient's two daughters, son in Sports coach and facility social worker during the [place].Patient's POA wishes to continue with DNR in case of a cardiopulmonary event.Also would like to fill out MOST form.Patient's family would like comfort measures and transfer to the hospital only if comfort needs cannot be met in the current facility.Family agrees to use of antibiotics and IVF fluids if indicated but no feeding tube.Signed MOST form placed in patient's chart by facility social worker.I've spent time from 11:00 -11:20 Am reviewing goals of care today.All questions answered to the best of my knowledge.MOST form to be reviewed in one year and as needed by POA.                 2. Dry eye of left side Has a Left eye prosthetic refused to remove for cleaning.Yellow crusty drainage  noted.cleanse left eye with warm wash clothe every 8 hours then apply artificial tears every 8 hours to keep eye moist.continue to monitor for signs of infections.   3. Normocytic anemia Hgb 10.7 ( 09/13/2017) previous 11.5 ( 09/01/2017) continue to monitor.Recheck CBC/diff in 2 weeks.   4. Moderate protein-calorie malnutrition TP 5.1 (09/17/2017).continue on protein supplements.continue to follow up with Registered Dietician.Recheck CMP in 2 weeks.   5. Hypoalbuminemia Alb 3.0 (09/17/2017).continue protein supplements and follow up with registered dietician as above.   6. Elevated liver Enzyme  Bilirubin 1.4,ALK phos 157,AST51 previous 41 (09/17/2017).currently on pravastatin 10 mg tablet daily.Recent LDL at goal.will decrease Pravastatin from 10 mg tablet to 5 mg tablet daily.Recheck CMP and fasting Lipid panel in 2 weeks.  Family/ staff Communication: Reviewed plan of care with patient and facility Nurse supervisor  Labs/tests ordered: CBC/diff,CMP and fasting lipid panel in 2 weeks.   Sandrea Hughs, NP

## 2017-09-20 LAB — BASIC METABOLIC PANEL
BUN: 59 — AB (ref 4–21)
Creatinine: 1.8 — AB (ref 0.6–1.3)
GLUCOSE: 123
Potassium: 5 (ref 3.4–5.3)
Sodium: 139 (ref 137–147)

## 2017-09-20 LAB — CBC AND DIFFERENTIAL
HEMATOCRIT: 33 — AB (ref 41–53)
HEMOGLOBIN: 11.3 — AB (ref 13.5–17.5)
Platelets: 185 (ref 150–399)
WBC: 10.7

## 2017-09-20 LAB — HEPATIC FUNCTION PANEL
ALT: 39 (ref 10–40)
AST: 50 — AB (ref 14–40)
Alkaline Phosphatase: 176 — AB (ref 25–125)
BILIRUBIN, TOTAL: 2.8

## 2017-09-21 ENCOUNTER — Encounter: Payer: Self-pay | Admitting: Internal Medicine

## 2017-09-21 ENCOUNTER — Non-Acute Institutional Stay (SKILLED_NURSING_FACILITY): Payer: Medicare Other | Admitting: Internal Medicine

## 2017-09-21 DIAGNOSIS — E861 Hypovolemia: Secondary | ICD-10-CM | POA: Diagnosis not present

## 2017-09-21 DIAGNOSIS — K449 Diaphragmatic hernia without obstruction or gangrene: Secondary | ICD-10-CM

## 2017-09-21 DIAGNOSIS — M545 Low back pain, unspecified: Secondary | ICD-10-CM

## 2017-09-21 DIAGNOSIS — R638 Other symptoms and signs concerning food and fluid intake: Secondary | ICD-10-CM

## 2017-09-21 DIAGNOSIS — I25118 Atherosclerotic heart disease of native coronary artery with other forms of angina pectoris: Secondary | ICD-10-CM

## 2017-09-21 DIAGNOSIS — K219 Gastro-esophageal reflux disease without esophagitis: Secondary | ICD-10-CM

## 2017-09-21 DIAGNOSIS — R748 Abnormal levels of other serum enzymes: Secondary | ICD-10-CM

## 2017-09-21 DIAGNOSIS — R627 Adult failure to thrive: Secondary | ICD-10-CM | POA: Diagnosis not present

## 2017-09-21 DIAGNOSIS — R6889 Other general symptoms and signs: Secondary | ICD-10-CM | POA: Diagnosis not present

## 2017-09-21 DIAGNOSIS — N179 Acute kidney failure, unspecified: Secondary | ICD-10-CM | POA: Diagnosis not present

## 2017-09-21 DIAGNOSIS — F419 Anxiety disorder, unspecified: Secondary | ICD-10-CM | POA: Diagnosis not present

## 2017-09-21 DIAGNOSIS — R1312 Dysphagia, oropharyngeal phase: Secondary | ICD-10-CM | POA: Diagnosis not present

## 2017-09-21 DIAGNOSIS — N183 Chronic kidney disease, stage 3 (moderate): Secondary | ICD-10-CM

## 2017-09-21 LAB — CMP 10231
Albumin: 3
Albumin: 3
CALCIUM: 13.4
CHLORIDE: 104
CHLORIDE: 106
CO2: 25
CO2: 27
Calcium: 11.2
GFR CALC NON AF AMER: 33
Globulin: 2
Globulin: 2.1
TOTAL PROTEIN: 5.1
Total Protein: 5

## 2017-09-21 NOTE — Progress Notes (Signed)
[facility]  Provider: Blanchie Serve MD   Location:  [facility] Room Number: 3 Place of Service:  SNF (31)  PCP: Blanchie Serve, MD Patient Care Team: Blanchie Serve, MD as PCP - General (Internal Medicine) Evans Lance, MD as PCP - Cardiology (Cardiology) Shon Hough, MD as Consulting Physician (Ophthalmology) Irine Seal, MD as Consulting Physician (Urology) Sable Feil, MD as Consulting Physician (Gastroenterology) Gerarda Fraction, MD as Consulting Physician (Ophthalmology) Sharon, [facility] Evans Lance, MD (Cardiology) Sable Feil, MD as Consulting Physician (Gastroenterology) Rosendo Gros, Marnee Spring (Inactive) as Consulting Physician (Ophthalmology) Ngetich, Nelda Bucks, NP as Nurse Practitioner (Family Medicine)  Extended Emergency Contact Information Primary Emergency Contact: Carmell [place], [place] Phone: [phone] Relation: Daughter  Code Status: DNR  Goals of Care: Advanced Directive information Advanced Directives 09/21/2017  Does Patient Have a Medical Advance Directive? Yes  Type of Paramedic of [place];Living will;Out of facility DNR (pink MOST or yellow form)  Does patient want to make changes to medical advance directive? No - Patient declined  Copy of [place] in Chart? Yes  Pre-existing out of facility DNR order (yellow form or pink MOST form) Yellow form placed in chart (order not valid for inpatient use);Pink MOST form placed in chart (order not valid for inpatient use)      Chief Complaint  Patient presents with  . Acute Visit    Fatigue and weakness    HPI: Patient is a 84 y.o. male seen today for acute visit. He has had a rapid decline in a week per nursing. He is hardly eating or drinking. He is eating about 25% of his meals compared to 60% on admission. This morning, he ate a cup of pudding and few  bites of apple sauce and egg. He is pocketing food in his mouth. SLP is working with him. Staff and family have noticed him to be moaning and groaning for 4 days. He is refusing to be out of bed for long and appears uncomfortable per family. He has not had a bowel movement in 3 days per nursing. Patient is very lethargic and does not participate in HPI and ROS. He follows few basic command. He is very hard of hearing. He is seen in his room today with his daughter/HCPOA and charge nurse present. HPI and ROS form patient's family and nursing. Of note, patient has medical history of CAD s/p CABG with anginal pain, CKD stage 3, CHB s/p pacemaker, aortic stenosis, hypertension among others. He has been here for rehabilitation and long term care since 09/12/17 with physical deconditioning and increased need for assistance with ADLs.    Past Medical History:  Diagnosis Date  . Actinic keratosis   . Acute venous embolism and thrombosis of unspecified deep vessels of lower extremity   . Anxiety   . Atrioventricular block, complete ([place])   . Benign neoplasm of colon    Polyp  . BPH (benign prostatic hypertrophy)   . Chronic kidney disease, stage II (mild) 12/10/2012  . Complete rupture of rotator cuff   . Contracture of palmar fascia   . Coronary atherosclerosis of native coronary artery   . Depressive disorder, not elsewhere classified   . Diabetes mellitus ([place])   . Diaphragmatic hernia without mention of obstruction or gangrene   . Diverticulosis    colon, Hx of  .  Diverticulosis of colon (without mention of hemorrhage)   . Dysphagia, unspecified(787.20)   . Esophageal reflux   . Hypertension   . Hypertrophy of prostate with urinary obstruction and other lower urinary tract symptoms (LUTS)   . Insomnia, unspecified   . Lumbago   . 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 Osteoarthrosis, unspecified whether generalized or localized, unspecified site   . Other and  unspecified hyperlipidemia   . Other specified disorder of male genital organs(608.89)    right epididymal cyst  . Peripheral vascular disease, unspecified ([place])   . Stricture and stenosis of esophagus   . Unspecified constipation   . Unspecified hearing loss   . Unspecified pruritic disorder    Past Surgical History:  Procedure Laterality Date  . APPENDECTOMY  1942  . arthoscopy right knee  1980  . CAROTID ENDARTERECTOMY  1987   right  . CATARACT EXTRACTION  [id number]  . COLONOSCOPY    . COLONOSCOPY  2005   Dr. Sharlett Iles  . ENUCLEATION  02/26/2009   left eye choroidal melanoma  . KNEE ARTHROSCOPY Left 1993  . PACEMAKER GENERATOR CHANGE  08/04/13   Dr. Lovena Le  . PACEMAKER INSERTION  2005; 08/04/2013   Dual chamber MDT pacemaker implanted by Dr Lovena Le in 2005 for CHB; gen change by Dr Lovena Le 07/2013 - MDT ADDRL1   . PERMANENT PACEMAKER GENERATOR CHANGE N/A 08/04/2013   Procedure: PERMANENT PACEMAKER GENERATOR CHANGE;  Surgeon: Evans Lance, MD;  Location: [facility] CATH LAB;  Service: Cardiovascular;  Laterality: N/A;  . TONSILLECTOMY  1931    reports that he quit smoking about 57 years ago. His smoking use included cigarettes. He has a 8.50 pack-year smoking history. He has never used smokeless tobacco. He reports that he does not drink alcohol or use drugs. Social History   Socioeconomic History  . Marital status: Married    Spouse name: Not on file  . Number of children: 3  . Years of education: Not on file  . Highest education level: Not on file  Occupational History  . Occupation: retired Pharmacist, community: RETIRED  Social Needs  . Financial resource strain: Not on file  . Food insecurity:    Worry: Not on file    Inability: Not on file  . Transportation needs:    Medical: Not on file    Non-medical: Not on file  Tobacco Use  . Smoking status: Former Smoker    Packs/day: 0.50    Years: 17.00    Pack years: 8.50    Types: Cigarettes    Last attempt to quit:  05/22/1960    Years since quitting: 57.3  . Smokeless tobacco: Never Used  Substance and Sexual Activity  . Alcohol use: No  . Drug use: No  . Sexual activity: Never  Lifestyle  . Physical activity:    Days per week: Not on file    Minutes per session: Not on file  . Stress: Not on file  Relationships  . Social connections:    Talks on phone: Not on file    Gets together: Not on file    Attends religious service: Not on file    Active member of club or organization: Not on file    Attends meetings of clubs or organizations: Not on file    Relationship status: Not on file  . Intimate partner violence:    Fear of current or ex partner: Not on  file    Emotionally abused: Not on file    Physically abused: Not on file    Forced sexual activity: Not on file  Other Topics Concern  . Not on file  Social History Narrative   Patient [facility] at [facility] since [facility].   Widowed   Living will, POA   In [place]   Pacemaker   Previously worked in Research officer, trade union   Exercise walks 15-20 minutes twice a day most days     Family History  Problem Relation Age of Onset  . Liver cancer Father   . Pancreatic cancer Father   . Cancer Father   . Heart disease Sister        CHF  . Diabetes Paternal Uncle     Health Maintenance  Topic Date Due  . PNA vac Low Risk Adult (2 of 2 - PCV13) 05/22/2006  . TETANUS/TDAP  05/23/2007  . URINE MICROALBUMIN  04/03/2017  . INFLUENZA VACCINE  12/20/2017  . OPHTHALMOLOGY EXAM  12/21/2017  . HEMOGLOBIN A1C  01/13/2018  . FOOT EXAM  02/14/2018    Allergies  Allergen Reactions  . Candesartan Rash  . Codeine Other (See Comments)    Runny eyes and nose,  Teary eyes  . Glyburide Other (See Comments)    Unknown  . Lisinopril Itching  . Magnesium Oxide     Rash, itching  . Uroxatral [Alfuzosin] Other (See Comments)    Heart races  . Epinephrine Palpitations  . Hctz [Hydrochlorothiazide] Rash    Outpatient Encounter  Medications as of 09/21/2017  Medication Sig  . acetaminophen (TYLENOL) 500 MG tablet Take 500 mg by mouth 2 (two) times daily as needed (low back pain).   Marland Kitchen aspirin 81 MG tablet Take 81 mg by mouth daily.  . cholecalciferol (VITAMIN D) 1000 UNITS tablet Take 2,000 Units by mouth daily.   Marland Kitchen docusate sodium (COLACE) 100 MG capsule Take 100 mg by mouth daily.  . finasteride (PROSCAR) 5 MG tablet TAKE 1 TABLET ONCE DAILY TO TREAT ENLARGED PROSTATE  . isosorbide mononitrate (IMDUR) 30 MG 24 hr tablet Take 0.5 tablets (15 mg total) by mouth daily.  Marland Kitchen loratadine (CLARITIN) 10 MG tablet Take 10 mg by mouth at bedtime as needed for allergies.   . metoprolol tartrate (LOPRESSOR) 25 MG tablet TAKE ONE AND ONE-HALF TABLETS TWICE A DAY  . Multiple Vitamins-Minerals (ICAPS PO) Take 1 capsule by mouth daily.   . nitroGLYCERIN (NITROSTAT) 0.4 MG SL tablet Place 1 tablet (0.4 mg total) under the tongue every 5 (five) minutes as needed for chest pain.  Marland Kitchen omeprazole (PRILOSEC) 40 MG capsule Take 40 mg by mouth daily.   . ONE TOUCH ULTRA TEST test strip CHECK BLOOD SUGAR ONCE DAILY.  . ONE TOUCH ULTRA TEST test strip CHECK BLOOD SUGAR ONCE DAILY.  Marland Kitchen polyethylene glycol (MIRALAX / GLYCOLAX) packet Take 17 g by mouth every three (3) days as needed.  . polyvinyl alcohol (LIQUIFILM TEARS) 1.4 % ophthalmic solution Place 1 drop into both eyes every 8 (eight) hours.  . pravastatin (PRAVACHOL) 10 MG tablet One daily to lower cholesterol  . sertraline (ZOLOFT) 50 MG tablet TAKE ONE-HALF (1/2) TABLET EVERY FIVE DAYS TO HELP ANXIETY AND DEPRESSION   No facility-administered encounter medications on file as of 09/21/2017.     Review of Systems  Unable to perform ROS: Mental status change (some confusion noted this visit compared to last clinic visit. limited HPI and ROS)  Constitutional: Positive  for appetite change and fatigue. Negative for diaphoresis.       Ongoing decline, poor oral intake, mostly in bed. Afebrile this  visit   HENT: Positive for hearing loss and trouble swallowing. Negative for congestion and rhinorrhea.   Eyes: Positive for visual disturbance.       Prosthetic left eye, has refused removal of it for cleaning care   Respiratory: Positive for cough. Negative for shortness of breath and wheezing.        Has been coughing mainly with meals  Cardiovascular: Positive for leg swelling. Negative for chest pain.  Gastrointestinal: Positive for constipation. Negative for abdominal pain, diarrhea and vomiting.       No bowel movement in 3 days, on mechanical soft diet and nectar thick liquid  Genitourinary:       Has been urinating. Unable to assess for dysuria  Musculoskeletal: Positive for back pain.  Skin: Negative for rash.  Hematological: Bruises/bleeds easily.  Psychiatric/Behavioral:       Unable to assess with his lethargy      Vitals:   09/21/17 0911  BP: 100/64  Pulse: 60  Resp: 16  Temp: 99 F (37.2 C)  TempSrc: Oral  SpO2: 91%  Weight: 140 lb 8 oz (63.7 kg)  Height: 5\' 8"  (1.727 m)   Body mass index is 21.36 kg/m.   Wt Readings from Last 3 Encounters:  09/21/17 140 lb 8 oz (63.7 kg)  09/17/17 140 lb 8 oz (63.7 kg)  09/12/17 140 lb 8 oz (63.7 kg)   Physical Exam  Constitutional:  Thin built, frail male in no acute distress  HENT:  Head: Normocephalic and atraumatic.  Dry mucus membrane  Eyes: Right eye exhibits no discharge. Left eye exhibits no discharge.  Right eye PERRLA, EOMI and normal conjunctiva, left eye prosthesis  Neck: Normal range of motion. Neck supple.  Cardiovascular: Normal rate, regular rhythm and intact distal pulses.  Murmur heard. Pacemaker to left chest wall  Pulmonary/Chest: Effort normal and breath sounds normal. No respiratory distress. He has no wheezes. He has no rales.  Abdominal: Soft. Bowel sounds are normal. There is no tenderness. There is no guarding.  Musculoskeletal:  Trace leg edema, follows simple command to raise arms but  has generalized weakness, needs 1 person assistance for transfer and repositioning  Lymphadenopathy:    He has no cervical adenopathy.  Neurological:  Unable to assess his orientation, lethargic, follows simple command, may give one word answer to some questions  Skin: He is not diaphoretic.  SDTI to sacrum with maroon purple discoloration 1.1 x 1.5 cm, blanchable redness to both heels. Warm and dry skin, not diaphoretic  Psychiatric:  Lethargic, calm    Labs reviewed: Basic Metabolic Panel: Recent Labs    02/05/17 0800 07/16/17 0000 09/01/17 0941 09/13/17 09/20/17  NA 138 137 133* 135*  135  135 139  K 4.5 4.7 4.4 4.5  4.5  4.5 5.0  CL 103 101 104 104 106  CO2 28 26 20* 25 27  GLUCOSE 87 104* 122*  --   --   BUN 31* 27* 32* 35*  35*  35* 59*  CREATININE 1.62* 1.62* 1.59* 1.7*  1.70  1.70 1.8*  CALCIUM 9.5 9.7 10.5* 11.2  11.2  11.2 13.4   Liver Function Tests: Recent Labs    02/05/17 0800 07/16/17 0000 09/13/17 09/20/17  AST 16 41* 51*  51  51 50*  ALT 10 40 45*  45  45 39  ALKPHOS  --   --  157*  157  157 176*  BILITOT 0.7 0.8 1.4  1.4  --   PROT 6.3 6.2 5.1  5.1  5.1 5.0  ALBUMIN  --   --  3.0  3  3 3.0   No results for input(s): LIPASE, AMYLASE in the last 8760 hours. No results for input(s): AMMONIA in the last 8760 hours. CBC: Recent Labs    09/01/17 0941 09/13/17 09/20/17  WBC 6.6 7.6  0-5  7.6  7.6 10.7  HGB 11.5* 10.7*  10.7  10.7 11.3*  HCT 34.4* 31*  30.8  30.8 33*  MCV 92.2  --   --   PLT 168 152 185   Cardiac Enzymes: No results for input(s): CKTOTAL, CKMB, CKMBINDEX, TROPONINI in the last 8760 hours. BNP: Invalid input(s): POCBNP Lab Results  Component Value Date   HGBA1C 6.1 (H) 07/16/2017   Lab Results  Component Value Date   TSH 3.05 09/13/2017   TSH 3.05 09/13/2017   No results found for: VITAMINB12 No results found for: FOLATE No results found for: IRON, TIBC, FERRITIN  Lipid Panel: Recent Labs     02/05/17 0800  CHOL 135  HDL 49  LDLCALC 69  TRIG 89  CHOLHDL 2.8   Lab Results  Component Value Date   HGBA1C 6.1 (H) 07/16/2017    Procedures since last visit: Dg Chest 2 View  Result Date: 09/01/2017 CLINICAL DATA:  Sudden onset chest pain. EXAM: CHEST - 2 VIEW COMPARISON:  Chest x-ray dated July 08, 2009. FINDINGS: Unchanged left chest wall AICD. The heart size and mediastinal contours are within normal limits. Normal pulmonary vascularity. Atherosclerotic calcification of the aortic arch. No focal consolidation, pleural effusion, or pneumothorax. No acute osseous abnormality. IMPRESSION: No active cardiopulmonary disease. Electronically Signed   By: Titus Dubin M.D.   On: 09/01/2017 10:03   Ct Head Wo Contrast  Result Date: 09/07/2017 CLINICAL DATA:  Patient fell after dinner tripping and falling forward hitting nose on the ground. Abrasions noted. Bruising of the neck. EXAM: CT HEAD WITHOUT CONTRAST CT MAXILLOFACIAL WITHOUT CONTRAST CT CERVICAL SPINE WITHOUT CONTRAST TECHNIQUE: Multidetector CT imaging of the head, cervical spine, and maxillofacial structures were performed using the standard protocol without intravenous contrast. Multiplanar CT image reconstructions of the cervical spine and maxillofacial structures were also generated. COMPARISON:  MRI of the head from 12/10/2003, cervical spine MRI 11/08/2003 FINDINGS: CT HEAD FINDINGS Brain: Superficial and central atrophy. No acute intracranial hemorrhage. Mild-to-moderate chronic small vessel ischemia of periventricular and subcortical white matter. No large vascular territory infarct. Midline fourth ventricle basal cisterns. Mild cerebellar atrophy. Unremarkable brainstem. Vascular: No hyperdense vessel sign. Skull: No skull fracture. Other: Soft tissue abrasion over the nose. CT MAXILLOFACIAL FINDINGS Osseous: No fracture or mandibular dislocation. No destructive process. Orbits: Left ocular prosthesis. Right lens up  replacement. No retrobulbar abnormality. Sinuses: Mild ethmoid sinus mucosal thickening. No air-fluid levels of the paranasal sinuses. Soft tissues: Soft tissue abrasion over the bridge of the nose. CT CERVICAL SPINE FINDINGS Alignment: Normal cervical lordosis Skull base and vertebrae: No acute fracture. No primary bone lesion or focal pathologic process. Soft tissues and spinal canal: No prevertebral fluid or swelling. No visible canal hematoma. Disc levels: Moderate disc space narrowing C2 through C4 and mild disc space narrowing from C5 through T2. No significant central or foraminal encroachment. Upper chest: Negative. Other: None IMPRESSION: 1. Atrophy with chronic small vessel ischemia. No acute intracranial abnormality. 2. No acute cervical spine fracture or posttraumatic listhesis. 3.  Soft tissue abrasion of the bridge of the nose. No maxillofacial fracture. 4. Left ocular prosthetic. Electronically Signed   By: Ashley Royalty M.D.   On: 09/07/2017 21:06   Ct Cervical Spine Wo Contrast  Result Date: 09/07/2017 CLINICAL DATA:  Patient fell after dinner tripping and falling forward hitting nose on the ground. Abrasions noted. Bruising of the neck. EXAM: CT HEAD WITHOUT CONTRAST CT MAXILLOFACIAL WITHOUT CONTRAST CT CERVICAL SPINE WITHOUT CONTRAST TECHNIQUE: Multidetector CT imaging of the head, cervical spine, and maxillofacial structures were performed using the standard protocol without intravenous contrast. Multiplanar CT image reconstructions of the cervical spine and maxillofacial structures were also generated. COMPARISON:  MRI of the head from 12/10/2003, cervical spine MRI 11/08/2003 FINDINGS: CT HEAD FINDINGS Brain: Superficial and central atrophy. No acute intracranial hemorrhage. Mild-to-moderate chronic small vessel ischemia of periventricular and subcortical white matter. No large vascular territory infarct. Midline fourth ventricle basal cisterns. Mild cerebellar atrophy. Unremarkable brainstem.  Vascular: No hyperdense vessel sign. Skull: No skull fracture. Other: Soft tissue abrasion over the nose. CT MAXILLOFACIAL FINDINGS Osseous: No fracture or mandibular dislocation. No destructive process. Orbits: Left ocular prosthesis. Right lens up replacement. No retrobulbar abnormality. Sinuses: Mild ethmoid sinus mucosal thickening. No air-fluid levels of the paranasal sinuses. Soft tissues: Soft tissue abrasion over the bridge of the nose. CT CERVICAL SPINE FINDINGS Alignment: Normal cervical lordosis Skull base and vertebrae: No acute fracture. No primary bone lesion or focal pathologic process. Soft tissues and spinal canal: No prevertebral fluid or swelling. No visible canal hematoma. Disc levels: Moderate disc space narrowing C2 through C4 and mild disc space narrowing from C5 through T2. No significant central or foraminal encroachment. Upper chest: Negative. Other: None IMPRESSION: 1. Atrophy with chronic small vessel ischemia. No acute intracranial abnormality. 2. No acute cervical spine fracture or posttraumatic listhesis. 3. Soft tissue abrasion of the bridge of the nose. No maxillofacial fracture. 4. Left ocular prosthetic. Electronically Signed   By: Ashley Royalty M.D.   On: 09/07/2017 21:06   Ct Maxillofacial Wo Contrast  Result Date: 09/07/2017 CLINICAL DATA:  Patient fell after dinner tripping and falling forward hitting nose on the ground. Abrasions noted. Bruising of the neck. EXAM: CT HEAD WITHOUT CONTRAST CT MAXILLOFACIAL WITHOUT CONTRAST CT CERVICAL SPINE WITHOUT CONTRAST TECHNIQUE: Multidetector CT imaging of the head, cervical spine, and maxillofacial structures were performed using the standard protocol without intravenous contrast. Multiplanar CT image reconstructions of the cervical spine and maxillofacial structures were also generated. COMPARISON:  MRI of the head from 12/10/2003, cervical spine MRI 11/08/2003 FINDINGS: CT HEAD FINDINGS Brain: Superficial and central atrophy. No  acute intracranial hemorrhage. Mild-to-moderate chronic small vessel ischemia of periventricular and subcortical white matter. No large vascular territory infarct. Midline fourth ventricle basal cisterns. Mild cerebellar atrophy. Unremarkable brainstem. Vascular: No hyperdense vessel sign. Skull: No skull fracture. Other: Soft tissue abrasion over the nose. CT MAXILLOFACIAL FINDINGS Osseous: No fracture or mandibular dislocation. No destructive process. Orbits: Left ocular prosthesis. Right lens up replacement. No retrobulbar abnormality. Sinuses: Mild ethmoid sinus mucosal thickening. No air-fluid levels of the paranasal sinuses. Soft tissues: Soft tissue abrasion over the bridge of the nose. CT CERVICAL SPINE FINDINGS Alignment: Normal cervical lordosis Skull base and vertebrae: No acute fracture. No primary bone lesion or focal pathologic process. Soft tissues and spinal canal: No prevertebral fluid or swelling. No visible canal hematoma. Disc levels: Moderate disc space narrowing C2 through C4 and mild disc space narrowing from C5 through T2. No significant central or  foraminal encroachment. Upper chest: Negative. Other: None IMPRESSION: 1. Atrophy with chronic small vessel ischemia. No acute intracranial abnormality. 2. No acute cervical spine fracture or posttraumatic listhesis. 3. Soft tissue abrasion of the bridge of the nose. No maxillofacial fracture. 4. Left ocular prosthetic. Electronically Signed   By: Ashley Royalty M.D.   On: 09/07/2017 21:06    Assessment/Plan  1. Failure to thrive in adult Poor oral intake, dehydrated, abnormal labs suggestive of dehydration, no clinical signs of infection, poor prognosis with his medical co morbidities. Reviewed goals of care with patient's daughter/HCPOA, other kids, grandkid and son in law present. Goal is for comfort measures, no hospitalization. Start iv fluids to help with dehydration and reassess labs. Obtain hospice consult. Several medication to be  discontinued to help reduce pill burden. Pain medication and anxiety medication provided for comfort measures.   2. Poor fluid intake Start iv fluid normal saline at 100 cc/hr for 2 liters followed by lab work. Encourage po intake.   3. Hypovolemia With labs s/o dehydration. Iv fluids for hydration, encourage po intake  4. Hypercalcemia Likely from dehydration and worsening renal function. Iv fluids as below and reassess with repeat CMP and ionized calcium and PTH as well. Goal of care is for comfort, no further hospitalization. Discontinue vitamin d supplement.   5. Acute renal failure superimposed on stage 3 chronic kidney disease, unspecified acute renal failure type ([place]) Encourage po fluid. Iv normal saline at 100 cc/hr x 2 liters, monitor urine outpiut, check bmp  6. Acute midline low back pain without sciatica Lidocaine patch for now and roxanol 5 mg q6h prn for severe pain. Supportive care. D/c tylenol with deranged LFT  7. Suspected deep tissue injury Clean area, apply barrier cream and foam dressing, air mattress to relief pressure. Repositioning   8. Atherosclerosis of native coronary artery of native heart with stable angina pectoris ([place]) Chest pain free at present. Continue prn nitroglycerin, daily metoprolol. D/c statin with deranged LFT. Supportive care.   9. Hiatal hernia with GERD Continue omeprazole with his history of reflux and esophageal stricture  10. Oropharyngeal dysphagia Aspiration precautions with high risk, continue mechanical soft diet and honey thick liquid, assist with feeding. Discontinued several medication with his dysphagia and to reduce pill burden to provide comfort care.   11. Anxiety disorder, unspecified type D/c zoloft. Start ativan 0.5 mg q6h prn anxiety and monitor.   12. Elevated liver enzymes D/c tylenol and statin. Monitor LFT   Labs/tests ordered:  Cbc with diff, CMP, ionized calcium, PTH 09/24/17  Communication: reviewed care plan  with patient's family- 2 daughters, a son, son in Sports coach and grandson and Camera operator.    I spent 60 minutes in total face-to-face time with the patient, more than 50% of which was spent in counseling and coordination of care, reviewing test results, reviewing medication and discussing or reviewing the diagnosis and care plan with patient.       Blanchie Serve, MD Internal Medicine [facility] [address] [place], [place] [address] Cell Phone (Monday-Friday 8 am - 5 pm): [phone] On Call: [phone] and follow prompts after 5 pm and on weekends Office Phone: [phone] Office Fax: [phone]

## 2017-09-26 ENCOUNTER — Telehealth: Payer: Self-pay | Admitting: Internal Medicine

## 2017-09-26 NOTE — Telephone Encounter (Signed)
New Message:    Pt's daughter called,she wanted Dr Lovena Le to know pt passed away on Sep 29, 2017.

## 2017-10-02 ENCOUNTER — Encounter: Payer: Self-pay | Admitting: Family

## 2017-10-05 ENCOUNTER — Ambulatory Visit: Payer: Medicare Other | Admitting: Cardiology

## 2017-10-20 DEATH — deceased

## 2018-02-13 ENCOUNTER — Encounter: Payer: Self-pay | Admitting: Internal Medicine

## 2019-01-05 IMAGING — DX DG CHEST 2V
2 series · 2 of 2 positions shown · non-contrast
Comparison: Chest x-ray dated July 08, 2009.

CLINICAL DATA: Sudden onset chest pain.

EXAM:
CHEST - 2 VIEW

[w chest lat]
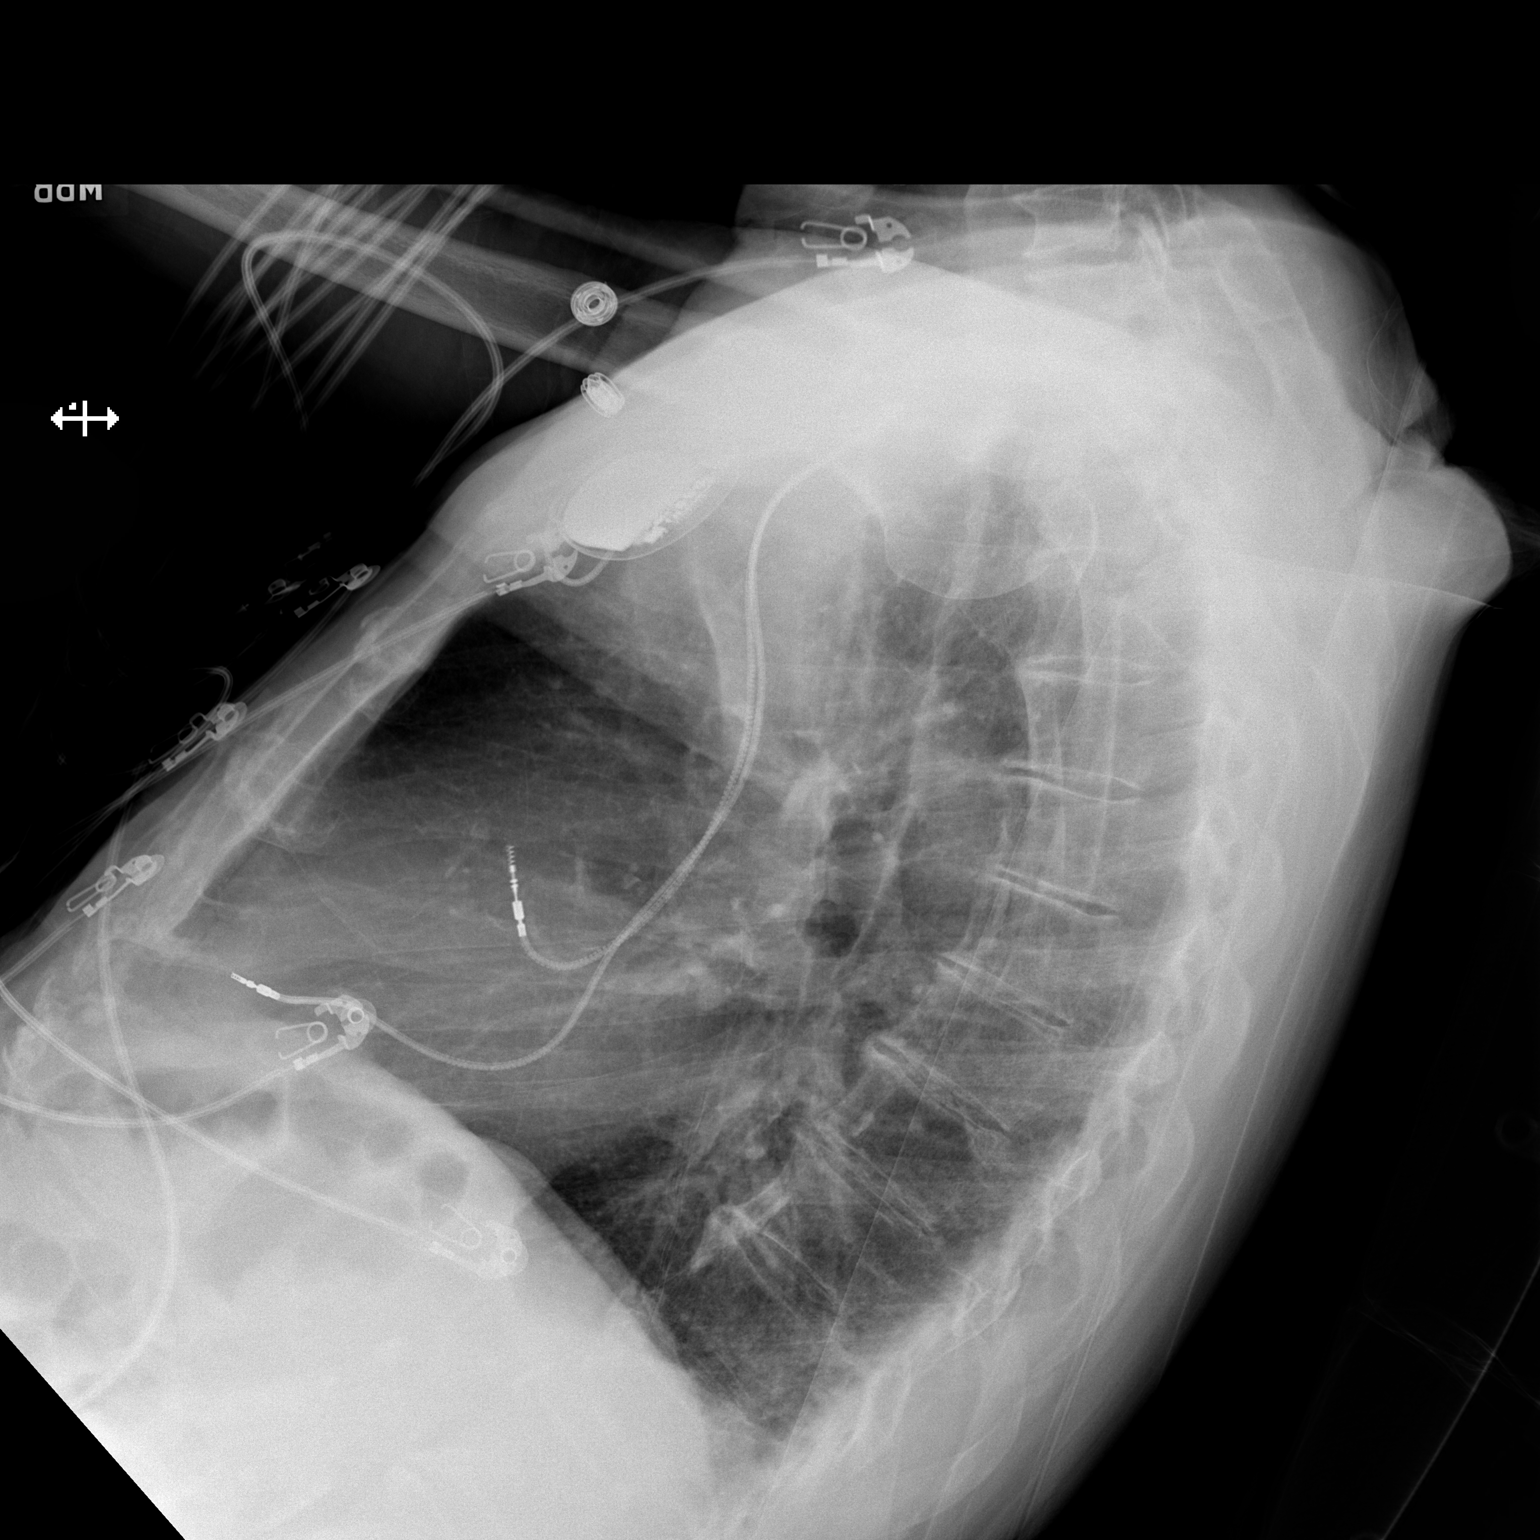

[x chest ap]
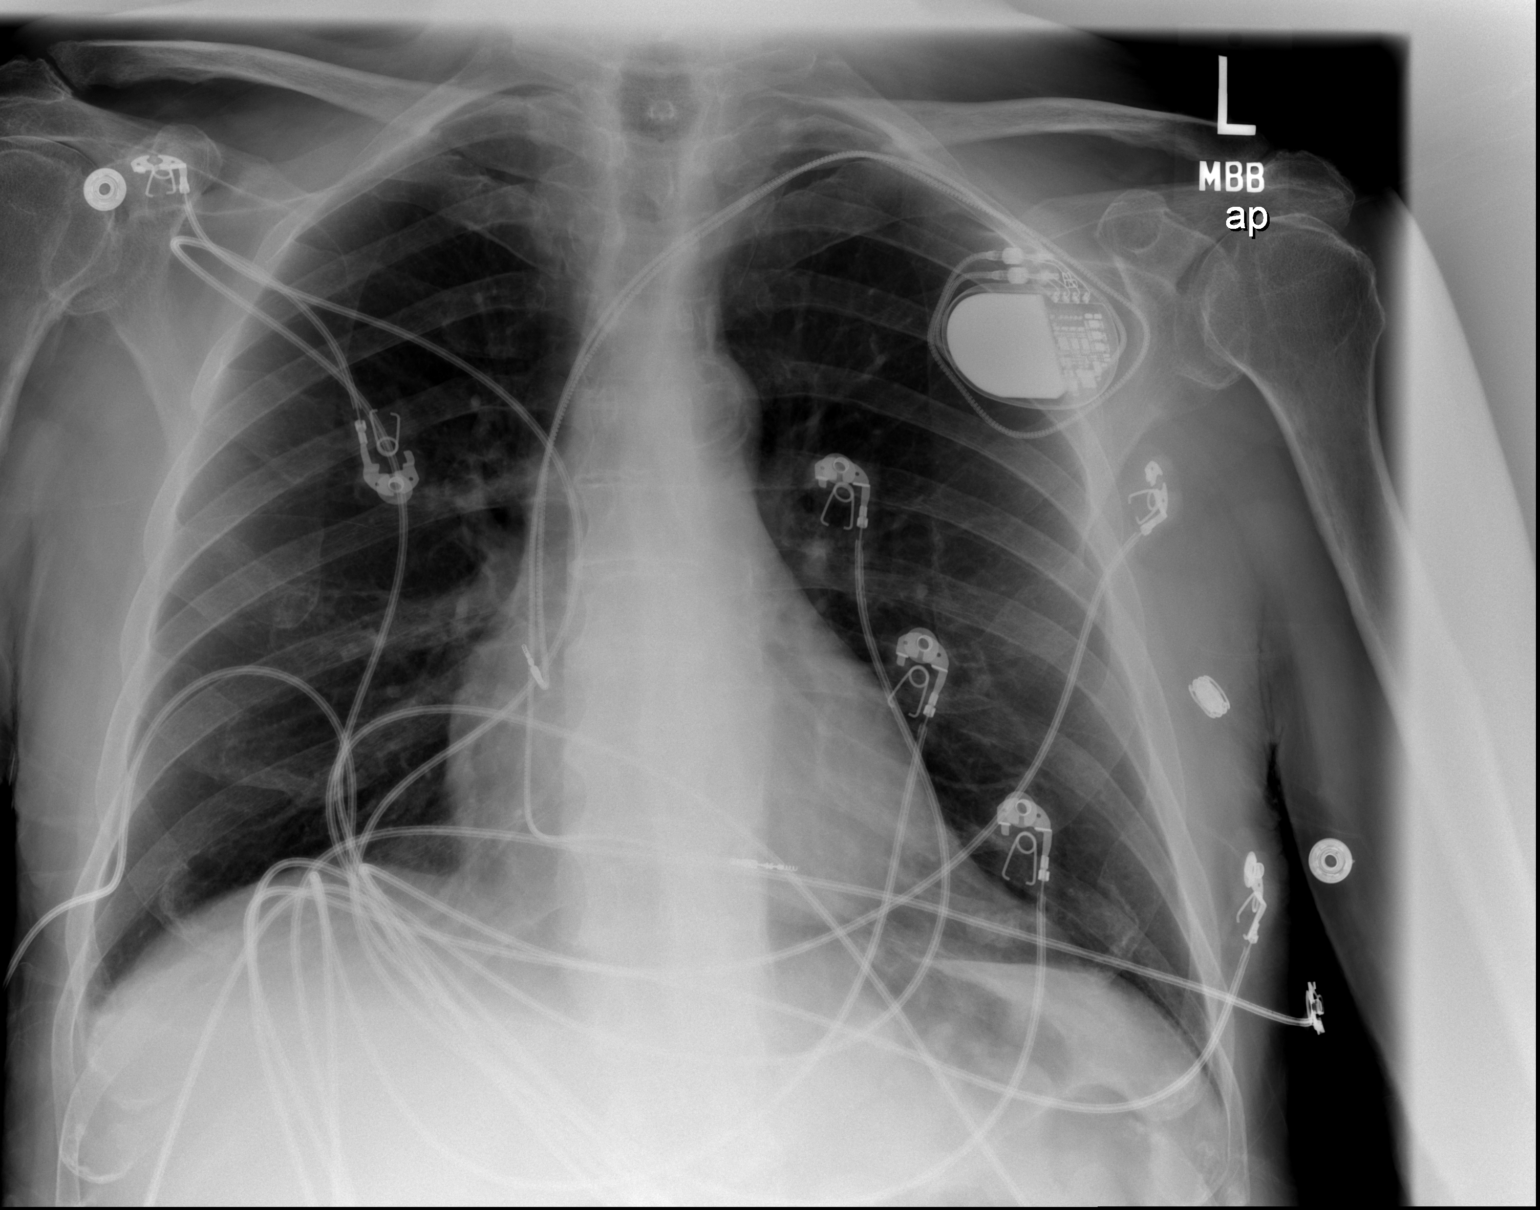

[2 of 2 positions shown; findings below may reference images not displayed]

FINDINGS: Unchanged left chest wall AICD. The heart size and mediastinal
contours are within normal limits. Normal pulmonary vascularity.
Atherosclerotic calcification of the aortic arch. No focal
consolidation, pleural effusion, or pneumothorax. No acute osseous
abnormality.
IMPRESSION: No active cardiopulmonary disease.
